# Patient Record
Sex: Male | Born: 1955 | Race: Black or African American | Hispanic: No | Marital: Married
Health system: Southern US, Community
[De-identification: ages and names within clinical notes are randomized; demographics above are authoritative.]

## PROBLEM LIST (undated history)

## (undated) DIAGNOSIS — F319 Bipolar disorder, unspecified: Secondary | ICD-10-CM

## (undated) DIAGNOSIS — F209 Schizophrenia, unspecified: Secondary | ICD-10-CM

## (undated) DIAGNOSIS — E119 Type 2 diabetes mellitus without complications: Secondary | ICD-10-CM

## (undated) DIAGNOSIS — I10 Essential (primary) hypertension: Secondary | ICD-10-CM

---

## 2002-01-13 ENCOUNTER — Encounter: Payer: Self-pay | Admitting: Emergency Medicine

## 2002-01-13 ENCOUNTER — Emergency Department (HOSPITAL_COMMUNITY): Admission: EM | Admit: 2002-01-13 | Discharge: 2002-01-13 | Payer: Self-pay | Admitting: Emergency Medicine

## 2003-10-07 ENCOUNTER — Emergency Department (HOSPITAL_COMMUNITY): Admission: EM | Admit: 2003-10-07 | Discharge: 2003-10-07 | Payer: Self-pay | Admitting: Emergency Medicine

## 2006-10-11 ENCOUNTER — Inpatient Hospital Stay (HOSPITAL_COMMUNITY): Admission: EM | Admit: 2006-10-11 | Discharge: 2006-10-26 | Payer: Self-pay | Admitting: Emergency Medicine

## 2006-10-11 ENCOUNTER — Ambulatory Visit: Payer: Self-pay | Admitting: Pulmonary Disease

## 2006-10-12 ENCOUNTER — Ambulatory Visit: Payer: Self-pay | Admitting: Cardiology

## 2006-10-12 ENCOUNTER — Encounter: Payer: Self-pay | Admitting: Pulmonary Disease

## 2006-10-15 ENCOUNTER — Encounter: Payer: Self-pay | Admitting: Internal Medicine

## 2006-10-15 ENCOUNTER — Ambulatory Visit: Payer: Self-pay | Admitting: Infectious Diseases

## 2006-10-16 ENCOUNTER — Ambulatory Visit: Payer: Self-pay | Admitting: Pulmonary Disease

## 2006-10-18 ENCOUNTER — Encounter: Payer: Self-pay | Admitting: Internal Medicine

## 2006-10-19 ENCOUNTER — Encounter: Payer: Self-pay | Admitting: Internal Medicine

## 2006-10-22 ENCOUNTER — Encounter: Payer: Self-pay | Admitting: Internal Medicine

## 2006-10-22 ENCOUNTER — Encounter: Payer: Self-pay | Admitting: Critical Care Medicine

## 2006-10-24 ENCOUNTER — Encounter: Payer: Self-pay | Admitting: Internal Medicine

## 2006-10-24 ENCOUNTER — Ambulatory Visit: Payer: Self-pay | Admitting: Physical Medicine & Rehabilitation

## 2006-12-14 ENCOUNTER — Telehealth: Payer: Self-pay

## 2006-12-17 DIAGNOSIS — E119 Type 2 diabetes mellitus without complications: Secondary | ICD-10-CM

## 2006-12-17 DIAGNOSIS — R1310 Dysphagia, unspecified: Secondary | ICD-10-CM | POA: Insufficient documentation

## 2006-12-17 DIAGNOSIS — IMO0002 Reserved for concepts with insufficient information to code with codable children: Secondary | ICD-10-CM

## 2007-01-01 ENCOUNTER — Ambulatory Visit: Payer: Self-pay | Admitting: Internal Medicine

## 2007-01-01 DIAGNOSIS — G062 Extradural and subdural abscess, unspecified: Secondary | ICD-10-CM | POA: Insufficient documentation

## 2007-01-01 LAB — CONVERTED CEMR LAB
HCT: 41.9 % (ref 39.0–52.0)
Hemoglobin: 12.8 g/dL — ABNORMAL LOW (ref 13.0–17.0)
Platelets: 302 10*3/uL (ref 150–400)
Sed Rate: 21 mm/hr — ABNORMAL HIGH (ref 0–16)

## 2007-05-13 ENCOUNTER — Emergency Department (HOSPITAL_COMMUNITY): Admission: EM | Admit: 2007-05-13 | Discharge: 2007-05-14 | Payer: Self-pay | Admitting: Emergency Medicine

## 2007-08-06 ENCOUNTER — Emergency Department (HOSPITAL_COMMUNITY): Admission: EM | Admit: 2007-08-06 | Discharge: 2007-08-08 | Payer: Self-pay | Admitting: Emergency Medicine

## 2007-08-08 ENCOUNTER — Ambulatory Visit: Payer: Self-pay | Admitting: Psychiatry

## 2007-08-08 ENCOUNTER — Inpatient Hospital Stay (HOSPITAL_COMMUNITY): Admission: AD | Admit: 2007-08-08 | Discharge: 2007-08-16 | Payer: Self-pay | Admitting: Psychiatry

## 2008-04-11 ENCOUNTER — Emergency Department (HOSPITAL_COMMUNITY): Admission: EM | Admit: 2008-04-11 | Discharge: 2008-04-14 | Payer: Self-pay | Admitting: Emergency Medicine

## 2008-04-14 ENCOUNTER — Inpatient Hospital Stay (HOSPITAL_COMMUNITY): Admission: AD | Admit: 2008-04-14 | Discharge: 2008-04-18 | Payer: Self-pay | Admitting: Psychiatry

## 2008-04-14 ENCOUNTER — Ambulatory Visit: Payer: Self-pay | Admitting: Psychiatry

## 2008-04-14 ENCOUNTER — Emergency Department (HOSPITAL_COMMUNITY): Admission: EM | Admit: 2008-04-14 | Discharge: 2008-04-14 | Payer: Self-pay | Admitting: Emergency Medicine

## 2009-04-07 ENCOUNTER — Other Ambulatory Visit: Payer: Self-pay | Admitting: Emergency Medicine

## 2009-04-07 ENCOUNTER — Inpatient Hospital Stay (HOSPITAL_COMMUNITY): Admission: EM | Admit: 2009-04-07 | Discharge: 2009-04-09 | Payer: Self-pay | Admitting: Psychiatry

## 2009-04-07 ENCOUNTER — Ambulatory Visit: Payer: Self-pay | Admitting: Psychiatry

## 2010-04-03 LAB — RAPID URINE DRUG SCREEN, HOSP PERFORMED
Amphetamines: NOT DETECTED
Barbiturates: NOT DETECTED
Benzodiazepines: NOT DETECTED
Cocaine: NOT DETECTED
Opiates: NOT DETECTED
Tetrahydrocannabinol: NOT DETECTED

## 2010-04-03 LAB — DIFFERENTIAL
Basophils Absolute: 0 10*3/uL (ref 0.0–0.1)
Eosinophils Absolute: 0 10*3/uL (ref 0.0–0.7)
Lymphocytes Relative: 23 % (ref 12–46)
Lymphs Abs: 2.5 10*3/uL (ref 0.7–4.0)
Neutro Abs: 7.8 10*3/uL — ABNORMAL HIGH (ref 1.7–7.7)
Neutrophils Relative %: 74 % (ref 43–77)

## 2010-04-03 LAB — CBC
MCV: 83.8 fL (ref 78.0–100.0)
Platelets: 172 10*3/uL (ref 150–400)
RBC: 5.66 MIL/uL (ref 4.22–5.81)
RDW: 15.2 % (ref 11.5–15.5)
WBC: 10.6 10*3/uL — ABNORMAL HIGH (ref 4.0–10.5)

## 2010-04-03 LAB — BASIC METABOLIC PANEL
Chloride: 103 mEq/L (ref 96–112)
GFR calc Af Amer: >60 mL/min (ref >60)
GFR calc non Af Amer: >60 mL/min (ref >60)
Glucose, Bld: 84 mg/dL (ref 70–99)
Potassium: 4.3 mEq/L (ref 3.5–5.1)

## 2010-04-03 LAB — TRICYCLICS SCREEN, URINE: TCA Scrn: NOT DETECTED

## 2010-04-20 LAB — URINALYSIS, ROUTINE W REFLEX MICROSCOPIC
Glucose, UA: NEGATIVE mg/dL
Hgb urine dipstick: NEGATIVE
pH: 5.5 (ref 5.0–8.0)

## 2010-04-20 LAB — COMPREHENSIVE METABOLIC PANEL
ALT: 22 U/L (ref 0–53)
AST: 37 U/L (ref 0–37)
Albumin: 4 g/dL (ref 3.5–5.2)
Calcium: 9 mg/dL (ref 8.4–10.5)
Chloride: 102 mEq/L (ref 96–112)
Creatinine, Ser: 1.09 mg/dL (ref 0.4–1.5)
GFR calc Af Amer: >60 mL/min (ref >60)
Sodium: 135 mEq/L (ref 135–145)
Total Bilirubin: 0.8 mg/dL (ref 0.3–1.2)

## 2010-04-20 LAB — GLUCOSE, CAPILLARY
Glucose-Capillary: 105 mg/dL — ABNORMAL HIGH (ref 70–99)
Glucose-Capillary: 109 mg/dL — ABNORMAL HIGH (ref 70–99)
Glucose-Capillary: 122 mg/dL — ABNORMAL HIGH (ref 70–99)
Glucose-Capillary: 122 mg/dL — ABNORMAL HIGH (ref 70–99)
Glucose-Capillary: 128 mg/dL — ABNORMAL HIGH (ref 70–99)
Glucose-Capillary: 150 mg/dL — ABNORMAL HIGH (ref 70–99)

## 2010-04-20 LAB — CBC
HCT: 45.5 % (ref 39.0–52.0)
Hemoglobin: 15.4 g/dL (ref 13.0–17.0)
MCHC: 33.9 g/dL (ref 30.0–36.0)
MCV: 82.2 fL (ref 78.0–100.0)
Platelets: 208 10*3/uL (ref 150–400)
RBC: 5.54 MIL/uL (ref 4.22–5.81)
RDW: 14.6 % (ref 11.5–15.5)
WBC: 6.8 10*3/uL (ref 4.0–10.5)

## 2010-04-20 LAB — URINE MICROSCOPIC-ADD ON

## 2010-04-20 LAB — DIFFERENTIAL
Eosinophils Absolute: 0.1 10*3/uL (ref 0.0–0.7)
Eosinophils Relative: 1 % (ref 0–5)
Lymphocytes Relative: 26 % (ref 12–46)
Lymphs Abs: 1.7 10*3/uL (ref 0.7–4.0)
Monocytes Absolute: 0.5 10*3/uL (ref 0.1–1.0)

## 2010-04-20 LAB — ETHANOL: Alcohol, Ethyl (B): <5 mg/dL (ref 0–10)

## 2010-04-20 LAB — RAPID URINE DRUG SCREEN, HOSP PERFORMED: Benzodiazepines: NOT DETECTED

## 2010-05-24 NOTE — Op Note (Signed)
NAME:  Marc [name], Marc [name] NO.:  [id number]   MEDICAL RECORD NO.:  [id number]          PATIENT TYPE:  INP   LOCATION:  3113                         FACILITY:  [facility]   PHYSICIAN:  Hilda Lias, M.D.   DATE OF BIRTH:  April 24, 1955   DATE OF PROCEDURE:  10/19/2006  DATE OF DISCHARGE:                               OPERATIVE REPORT   PREOPERATIVE DIAGNOSIS:  Thoracolumbar epidural hematoma status post  anterior cervical epidural abscess.   POSTOPERATIVE DIAGNOSIS:  Thoracolumbar epidural abscess status post  anterior cervical epidural abscess.   PROCEDURE:  Thoracolumbar laminectomy, evaluation of the epidural  abscess, cultures to the lab.   CLINICAL HISTORY:  [name] is a gentleman who, 72 hours ago,  underwent anterior L3-L4 discectomy to evaluate an epidural abscess.  The patient was kept in the intensive care unit.  He was intubated. We  knew by x-ray that he has also abscess coming from the thoracic 5 down  to the sacrum.  We were worried about his life threatening cervical  epidural and that is why we did that one 72 hours ago.  Now, we are  going to take him to do the decompression of the thoracolumbar area.  The wife and the rest of the family knew the risks including the  possibility of paralysis.   OPERATIVE PROCEDURE:  [name] was taken to the operating room.  He is  quite obese and it was difficult to palpate any spinous process.  Nevertheless, we cleaned the thoracolumbar area with DuraPrep.  We were  able to feel some of the ribs and we made the incision from the upper  lumbar area to the lower thoracic area.  X-ray eventually showed that we  were probably at the level of T10-T11.  For this type of procedure,  there was no need to visualize the exact level because of the compromise  of the whole spine.  The incision was carried out all the way down until  we found the spinous process and the muscle was retracted laterally.  Then with the Leksell,  we proceeded with removal of the spinous process  and with the drill with the laminectomies.  We removed the yellow  ligament and immediately liquid pus came into view.  The specimen was  sent to the laboratory, although we knew that the one growing in the  neck was MRSA.  Nevertheless, after we removed this initial abscess, we  continued to remove the rest of the yellow ligament.  Again, using a  pediatric feeding tube as we did with the cervical area, it was  introduced first proximal about 10 cm and more pus came into view.  Then, the same catheter was introduced down in the epidural space all  the way down about 20 cm.  More liquid pus was removed.  Then, slowly  using saline solution, we started to irrigate the thoracic and the  lumbar area bringing more pus into view.  At the end, we did a total  gross removal of the liquid pus.  The dura mater was thick and yellow.  From  then on, a Hemovac was left in the operative site. The wound was  closed with Vicryl and skin with staples.  The patient is going to go  back to the intensive care unit. We are going to continue him on the  previous IV antibiotic therapy.           ______________________________  Hilda Lias, M.D.     EB/MEDQ  D:  10/19/2006  T:  10/20/2006  Job:  [id number]

## 2010-05-24 NOTE — Consult Note (Signed)
NAME:  KENDELL, SAGRAVES NO.:  [id number]   MEDICAL RECORD NO.:  [id number]          PATIENT TYPE:  INP   LOCATION:  3113                         FACILITY:  [facility]   PHYSICIAN:  Hilda Lias, M.D.   DATE OF BIRTH:  [birth year]   DATE OF CONSULTATION:  10/17/2006  DATE OF DISCHARGE:                                 CONSULTATION   Mr. Morino is a gentleman who was admitted to [facility] on  October 10, 2006 because of complaint of weakness.  The patient has a  history of nausea and vomiting.  He has a history of diabetes with the  blood sugar initially on admission was and admission was 370.  He has a  history of hypertension.  The night the patient became febrile.  The  blood culture was positive.  He complains of back pain.  Because of the  back pain, he had a CT scan and because of the findings, he was sent to  [facility] MRI department October 8 about 10 o'clock.  The patient was  to have an MRI, but because of restlessness, the patient had to be  intubated and sedated.  I was called to see him and although I was  unable to see him initially because he was in the MRI, nevertheless on  three or four occasions, I was able to follow the MRI result with the  radiologist.  At the end we find out that this gentleman has a large  epidural abscess anterior from C1 all the way down to C5-6 and posterior  from thoracic #8 down to the  sacral L5-S1.  The patient was transferred  to the intensive care unit.  He still was heavily sedated and intubated.  From my point of view, there was no way to find out how neurologically.  There was no way for me to find any weakness whatsoever or any history  of numbness.  The patient was having a Foley catheter.  Because of the  findings, although the thoracolumbar as well as the cervical was quite  dramatic, nevertheless I was concerned mostly about the size of the  epidural hematoma in the cervical area,  going to C1, almost up to  the  base of the skull and to the brain stem.  I was able to talk to his wife  when she came about 6 o'clock.  I fully explained to her the situation.  She was fully aware that neurologically I do not know in what condition  he was in prior to admission, during admission and transfer this morning  to the hospital.  I still did not know because the medication he was  having to be intubated.  I told her that we need to proceed with the  decompression of the cervical area daily to prevent damage to the brain  and avoid the possibility of a quadriplegia.  Then we can later on  address the thoracolumbar area either tomorrow or on Friday, but my  priority was  the cervical area because of the catastrophic consequence  if this problem continued  to go back up into the base of the brain.  She  is fully awake and she knew about the risk of paralysis, of course,  unable to remove the abscess damage to the vessels of the neck,  paralysis, no improvement whatsoever, failure of the graft, failure of  the hardware needed for the surgery.  With those conditions she fully  approved an exploration of the cervical area.           ______________________________  Hilda Lias, M.D.     EB/MEDQ  D:  10/17/2006  T:  10/18/2006  Job:  [id number]
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
## 2010-05-24 NOTE — Consult Note (Signed)
NAME:  Marc [name], Marc [name] NO.:  [id number]   MEDICAL RECORD NO.:  [id number]          PATIENT TYPE:  INP   LOCATION:  1522                         [facility]ACILITY[facility]  [facility]   PHYSICIAN:  Marlan Palau, M.D.  DATE OF BIRTH:  11-07-1955   DATE OF CONSULTATION:  10/15/2006  DATE OF DISCHARGE:                                 CONSULTATION   NEUROLOGY CONSULT   HISTORY:  This is a 55 year old right-handed black male born Oct 20, 1955 with a history of hypertension, medical noncompliance and new  onset diabetes.  The patient claims to have had a several month history  of some neck stiffness, particularly worse over the last 2 weeks.  The  patient has also noted progressive weakness of the legs over the last 2  weeks to the point where he is no longer able to walk during this  hospitalization.  The patient has had some urinary frequency and urgency  as well for the 2 weeks prior to admission and the neck stiffness is  particularly worsened, with the discomfort going all the way down the  spine to the low back into the legs.  The patient can barely move his  neck side-to-side or flexion/extension without getting significant pain.  The patient denies any discomfort going down the arms, has not had any  weakness of the arms.  The patient denies any falls per se.  The patient  has had a CT scan of the brain that is unremarkable. has had some  confusion associated with sepsis, was seen by Dr. Delford Field from critical  care medicine who questioned the possibility of a spinal epidural  abscess.  For this reason, a neurology consultation was obtained.  The  patient has had very high white counts during this hospitalization and  has been treated for possible staph sepsis.  White blood count range has  been in the 15-20 range.   PAST MEDICAL HISTORY:  1. Progressive lower extremity weakness, urinary dysfunction over the      last 2 weeks, rule out spinal epidural abscess  2. Obesity.  3. Diabetes  4. Hypertension  5. Tobacco abuse.  The patient smokes a pack of cigarettes a day.      Does not drink alcohol.   ALLERGIES:  The patient has no known allergies.   CURRENT MEDICATIONS:  1. Lovenox 60 mg subcu daily  2. Advair 1 puff b.i.d.  3. Sliding scale insulin, Lantus insulin 20 units q.h.s.,  4. Metoprolol 25 mg b.i.d.  5. Protonix 40 mg daily  6. Potassium supplementation for significant hypokalemia  7. Vancomycin 1750 mg IV q.12 h  8. Normodyne if needed   SOCIAL HISTORY:  [place], is married, has two children who are alive and well.  Has worked  in the past as a Naval architect.   FAMILY MEDICAL HISTORY:  Notable that mother is alive with hypertension.  Father died with stroke.  The patient has no sisters, has one brother  who is alive and well.   REVIEW OF SYSTEMS:  Is notable for some fevers, chills prior to  admission.  Denies headache, does have some neck and back discomfort as  above, increased pain with flexion/extension of the neck.  Denies  Lhermitte's phenomenon, denies shortness of breath, chest pains,  abdominal pain, nausea, vomiting, has had some constipation and some  dysfunction of the bladder over the last 2-3 weeks, has had some trouble  walking but no falls.   PHYSICAL EXAMINATION:  VITALS:  Blood pressure is currently 129/96,  heart rate 109, respiratory rate 18, temperature 100.2  IN GENERAL the patient is a moderately obese black male who is alert and  cooperative at the time examination.  HEENT:  Examination, head is atraumatic.  EYES:  Pupils equal, round and  react to light.  Disks are flat bilaterally.  NECK is supple.  No carotid bruits noted.  RESPIRATORY:  Examination reveals occasional wheezes bilaterally.  Occasional rhonchi.  CARDIOVASCULAR:  Regular rate and rhythm.  No obvious murmurs, rubs  noted.  EXTREMITIES:  Reveal 2+ edema at the ankles bilaterally.  NEUROLOGIC  EXAMINATION:  Cranial nerves as above.  Facial symmetry is  present.  The patient has good sensation of face to pinprick, soft touch  bilaterally.  Has good strength of the facial muscles, muscles of head  turn, shoulder shrug bilaterally.  Speech is well enunciated, not  aphasic.  Again extraocular movements are full, visual fields are full.  Motor testing reveals good strength in both upper extremities.  With  lower extremities, the patient has barely antigravity movement of the  lower extremities, has definite weakness with the quadriceps muscles  bilaterally.  Has fairly good dorsiflexion of the feet bilaterally.  Patient has fair finger-nose-finger, cannot really perform heel-to-shin  well with the legs.  No sensory level is present.  Patient has good  pinprick sensation throughout.  Good vibratory sensation throughout.  No  drift of the upper extremities noted.  Deep tendon reflexes depressed  throughout.  Toes are neutral bilaterally.   LABORATORY VALUES:  Notable for a sodium level 132, potassium 3.5,  chloride of 96, CO2 of 30, glucose of 49, BUN seven, creatinine 0.73,  total bili 0.7, alk phosphatase of 133, SGOT of 101, SGPT of 110, total  protein 5.5, albumin of 1.6, calcium 7.8, phos of 2.7, magnesium 1.8.   CT of the head is as above.   IMPRESSION:  1. History of febrile illness with sepsis  2. Neck stiffness, progressive lower extremity weakness and      bowel/bladder dysfunction.   Critical care medicine has seen and evaluated this patient and has  recommended spine evaluation for possible epidural abscess.  I would be  in agreement with this.  This patient has a history and physical  examination that are fully consistent with this.  Need to rule out other  causes of spinal cord dysfunction such as external compression from disk  or bone spur.  Hypokalemia may be adding some of the weakness in the  lower extremities.  Will check CK levels as well.   PLAN:  1. The  patient has already been set up for an MRI scan of the      cervical, thoracic and lumbosacral spine.  I would agree with this  2. Check CK level  3. I will follow the patient's clinical course. If epidural abscess is      present neurosurgical evaluation may be indicated.  Will follow the      patient's clinical course while in-house.  Marlan Palau, M.D.  Electronically Signed     CKW/MEDQ  D:  10/15/2006  T:  10/15/2006  Job:  [id number]

## 2010-05-24 NOTE — Op Note (Signed)
NAME:  Marc [name], Marc [name] NO.:  [id number]   MEDICAL RECORD NO.:  [id number]          PATIENT TYPE:  INP   LOCATION:  3113                         FACILITY:  [facility]   PHYSICIAN:  Hilda Lias, M.D.   DATE OF BIRTH:  [birth year]   DATE OF PROCEDURE:  10/17/2006  DATE OF DISCHARGE:                               OPERATIVE REPORT   PREOPERATIVE DIAGNOSES:  1. C1-C6 thoracolumbar epidural abscess.  2. History of diabetes mellitus.  3. Hypertension.   POSTOPERATIVE DIAGNOSES:  1. C1-C6 thoracolumbar epidural abscess.  2. History of diabetes mellitus.  3. Hypertension.   PROCEDURE:  C3-C4 diskectomy, partially removed posterior border of C3  and C4, opening the posterior ligament,  evacuation of pus in the  epidural space anteriorly.  Microscope.   SURGEON:  Hilda Lias, M.D.   CLINICAL HISTORY:  The patient is being taken to surgery as emergency  because of the history of epidural hematoma from C1 down to C5-6  anteriorly.  He also has a posterior abscess from thoracic 6 down to the  L5-S1.  The surgery was fully explained to the wife.   PROCEDURE:  Anterior C3-C4 diskectomy, partial removal of the posterior  aspect of C3 and C4 with a question of pus from the epidural space.   SURGEON:  Hilda Lias, M.D.   PROCEDURE:  The patient was taken to the OR.  The patient had been  previously intubated. A shoulder roll was applied and then traction with  10 pounds was used for the neck.  The patient  is quite obese with a  short neck.  The neck was prepped with DuraPrep. Drapes were applied.  A  longitudinal incision was made through the skin and subcutaneous tissue.  Immediately we found that right immediately after we opened the  platysma, there was quite a bit of swelling mostly in the muscle area as  well as well as the esophagus.  Dissection was carried down and it was  difficult to see and feel the C2-C3 space.  Because of that, we did an x-  ray and showed  that indeed we were at the level of C3-C4.  From then on  we brought the microscope into the area, the anterior ligamentum was  opened and a total diskectomy was achieved.  Then before we opened the  posterior ligament with the drill, we removed the posterior aspect of C3  and also C4.  Having a good wide area, we opened the posterior ligament.  Immediately liquid pus at high pressure came. A specimen was taken and  sent to the laboratory.  Then using suction and a micro hook and later  on small to about 2 cm length where inserted into the epidural space.  There was at least 4-5 mm space between the posterior ligament and the  dura mater.  Pus came easily. Then we inserted a small pediatric  catheter about 2-3 inches from C3.  At the beginning vacuum pressure was  applied to drain the area.  Then with a small amount and low pressure  saline solution, we cleaned the  area.  The same procedure was done down  below C4 introducing the catheter at least 4-6 inches.  The same  irrigation was achieved.  This was done at least 10 times until we were  unable to get more cleaned with pulse.  Because of that, we decided not  to proceed with a corpectomy.  Having a good decompression, we introduced an allograft of 10 mm height.  This was followed by a plate using four screws.  Lateral cervical spine  showed good position of the bone graft at the plate.  From then on two  drain were left in the precervical area and the wound was closed with  Vicryl and Steri-Strips.           ______________________________  Hilda Lias, M.D.     EB/MEDQ  D:  10/17/2006  T:  10/18/2006  Job:  [id number]

## 2010-05-24 NOTE — H&P (Signed)
NAME:  Marc [name], Marc [name] NO.:  [id number]   MEDICAL RECORD NO.:  [id number]          PATIENT TYPE:  EMS   LOCATION:  ED                           FACILITY:  [facility]   PHYSICIAN:  Hollice Espy, M.D.DATE OF BIRTH:  [birth year]   DATE OF ADMISSION:  10/10/2006  DATE OF DISCHARGE:                              HISTORY & PHYSICAL   CHIEF COMPLAINT:  Weakness.   HISTORY OF PRESENT ILLNESS:  The patient is a 55 year old African  American male with past medical history of hypertension, was previously  on medication but has not taken it for some time who was previously an  [facility] patient several years ago.  Since that time, the patient has lost  his job and has not followed up on any position he comes to the  emergency room today with complaints of weakness, fatigue, polyuria for  several days.  In the emergency room, the patient was found to have  blood glucose of 369.  The rest of his labs were noted for a bicarb  level of 18, a white count 20.6 and 97% shift.  The urinalysis noted a  large amount of hemoglobin, greater than 1000, and glucose greater than  80 ketones, all consistent with new onset diabetic and secondary  dehydration.  His urinalysis unremarkable for any signs of infection.  The patient was started on IV fluids and insulin and his sugars since  have come down.  He claims feeling quite fatigued and weak still.  There  is also some generalized tremors from being unsteady on his feet which  is new.  He denies any headaches, vision changes, dysphagia, chest pain,  palpitations, shortness of breath, wheezing, coughing  abdominal pain.  No hematuria, dysuria.  He does complain of polyuria.  No constipation,  diarrhea, no focal extremity numbness, weakness or pain.  He complains  of generalized upper and lower extremity shaking.  His review of systems  otherwise negative.   PAST MEDICAL HISTORY:  Hypertension.   MEDICATIONS:  He was placed on blood pressure  medicines but stopped  taking them years ago, says he cannot afford them.  He has no other past  medical history.   ALLERGIES:  None.   SOCIAL HISTORY:  He smokes about a pack a day.  Denies any drug or  alcohol use.   FAMILY HISTORY:  Noncontributory.   PHYSICAL EXAMINATION:  VITAL SIGNS:  On admission, temperature 97.3,  heart rate 106, blood pressure 158/99, respirations 24, oxygen  saturation 95% on room air.  GENERAL:  The patient is alert and oriented x3, in no apparent distress.  HEENT:  Normocephalic, atraumatic.  Mucous membranes are dry.  He has no  carotid bruits.  HEART:  Regular rhythm.  Mild tachycardia.  LUNGS:  Clear to auscultation bilaterally.  ABDOMEN:  Soft, nontender, nondistended.  Positive bowel sounds.  EXTREMITIES:  No clubbing or cyanosis.  Trace pitting edema.   LABORATORY DATA:  Sodium 121, potassium 3, chloride 82, bicarb 18, BUN  13, creatinine 1.37, glucose 369.  White count 20.6, hemoglobin 14,  hematocrit 43, MCV 81,  platelet count 231,000, 86% neutrophils.  Urinalysis showed large hemoglobin, greater than 1000 glucose, greater  and 88 ketones.  Protein 30. PH 7.3, pCO2 24, pO2 77, bicarb 12. This  was on room air but his UA micro  was 1120 red cells, few bacteria.   ASSESSMENT/PLAN:  1. New onset diabetic.  Put the patient on sliding scale with diabetic      education and metformin 500 mg b.i.d. Changing the medicine that      should be able to be cheap for him to buy on the generic [employer]      or [employer] plan.  I have reinforced the need for the patient to take      his medications on a monthly basis versus not being able to afford      hospital bills which will occur if he did not take his medications.      He tells me he would be willing to do so.  2. Hypertension.  Will start the patient on lisinopril which will both      be good for renal preservation as well as his hypertension.  3. Medical noncompliance.  4. Tobacco abuse.  The patient  declines nicotine patch.      Hollice Espy, M.D.  Electronically Signed     SKK/MEDQ  D:  10/11/2006  T:  10/11/2006  Job:  [id number]   cc:   Dala Dock
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
## 2010-05-24 NOTE — Discharge Summary (Signed)
NAME:  Marc [name], Marc [name] NO.:  [id number]   MEDICAL RECORD NO.:  [id number]          PATIENT TYPE:  INP   LOCATION:  5153                         FACILITY:  [facility]   PHYSICIAN:  Charlcie Cradle. Delford Field, MD, FCCPDATE OF BIRTH:  [birth year]   DATE OF ADMISSION:  10/16/2006  DATE OF DISCHARGE:  10/22/2006                               DISCHARGE SUMMARY   INTERIM DISCHARGE SUMMARY:   FINAL DIAGNOSES:  Final discharge diagnoses upon time of critical care  signoff.  1. Epidural abscess, status post drainage with culture data      demonstrating methicillin Staph aureus.  2. Dysphagia.  3. Debilitation.  4. Resolved sepsis/septic shock secondary to problem #1.   CONSULTATIONS:  1. Cliffton Asters, M.D. with infectious disease.  2. Hilda Lias, M.D. with neurosurgery.  3. Marlan Palau, M.D. with neurology.   PROCEDURES:  1. October 11, 2006 - left internal jugular vein catheter, removed      October 14, 2006.  2. Right PICC line placed October 15, 2006.  3. Endotracheal tube placed October 17, 2006.  4. Right radial A line placement October 17, 2006.  5. Oroendotracheal tube removed October 20, 2006.  6. Arterial line removed October 20, 2006.   ADDITIONAL PROCEDURES:  1. C1 through C5 diskectomy, partial removal of posterior border of C3      and C4, opening of posterior ligament and evacuation of pus in the      epidural space by Dr. Jeral Fruit on October 17, 2006.  2. Thoracolumbar laminectomy with evaluation of epidural abscess and      evacuation on October 19, 2006.   LABORATORY DATA:  1. October 22, 2006 - sodium 132, potassium 4, chloride 93, CO2 33,      glucose 119, BUN 8, creatinine 0.61.  2. October 22, 2006 - white blood cell count 11.9, hemoglobin 9.8,      hematocrit 28.1, platelet count 354.   X-RAY AND MICROBIOLOGY:  1. Urine culture obtained September 19, 2006 negative.  Blood cultures      both obtained September 19, 2006 demonstrating methicillin  sensitive Staphylococcus aureus.  Blood cultures obtained on      October 14, 2006 both negative.  2. Abscess culture from cervical spine, epidural abscess, obtained      09/17/06, demonstrating methicillin-sensitive Staph aureus.  3. Wound culture obtained on October 19, 2006.  Preliminary data      demonstrating diffuse staph aureus which again was methicillin      sensitive.   RADIOLOGY:  Chest x-ray obtained on October 20, 2006 demonstrated right  basilar atelectasis.   BRIEF HISTORY:  Marc [name] is a 55 year old African-American male who  presented to [facility] initially on October 10, 2006, admitted by  the [facility] internal medicine cervix.  See their history and physical  dictation.  He was initially admitted to the intensive care secondary to  metabolic acidosis, hyperglycemia, metabolic acidosis and sepsis.  Ten  cultures were obtained upon presentation with blood cultures positive  for methicillin associated.  Initial shock was resolved with organ  failure improved and therefore  he was moved to the medical further  therapy.  Upon evaluation after resolution of shock physiology, patient  continued to complain of neck pain and lower extremity weakness.  Therefore neurological and infectious disease consultations were  obtained due to clinical findings that appeared consistent with epidural  abscess.  Attempts were made at [facility] to obtain the  appropriate objective studies, including MRI; however, due to [name] discomfort and inability to lie flat for the MRI exam, he had  to be transferred to [facility] on October 16, 2006, where he was  subsequently intubated and then underwent extensive MRI of the cervical  to lumbar spine evaluation.  With the findings essentially demonstrating  large ventral epidural abscess extending from the upper C1 to the C6  level with most notable at C2, causing cord compression, with extension  of the epidural abscess all  the way down to the S1 region.  Therefore,  neurosurgical was consulted.   HOSPITAL COURSE:  1. Epidural abscess, extensive, ranging from C-spine all the way down      to S1.  Now status post evacuation per neurosurgery with cultures      demonstrating methicillin sensitive Staph aureus.  Marc [name] as      previously mentioned, was transferred to [facility] for      diagnostic evaluation by MRI, which were not able to do at [facility].  The MRI findings were indeed positive for epidural      abscess and he therefore underwent surgical evaluation. Initially,      on October 17, 2006 of the C-spine area and then later on October 19, 2006, of the thoracolumbar area.  Upon time of critical care      signoff, Marc [name] is making slow progress following surgical      evacuation of epidural abscess.  He is now on day #10 of 6 weeks.      Again day 10 of 6 weeks of antibiotic therapy, which has been      guided by the infectious disease department.  2. Status post septic shock secondary to problem #1.  This was treated      in a typical fashion with early gold-directed therapy, aggressive      volume resuscitation, and empiric antibiotics.  Are resolution of      septic shock he was found to have persistent back pain, which upon      further evaluation demonstrated epidural abscesses  3. Diabetes type 2.  Marc [name] initially presented with extremely      high blood glucoses, felt to be exacerbated by acute infection.  He      initially carried the diagnosis of hyperosmolar nonketotic coma.      He did have a lactic acidosis upon time of presentation, but no      ketones in the blood.  This was resolved with aggressive volume      resuscitation during the early sepsis resuscitation time-frame.      Insulin infusion was initiated.  He remains hyperglycemic secondary      to diabetes type 2 and he does require sliding scale insulin upon      time of critical care  service signoff.  4. Dysphagia.  Evaluated by modified barium swallow, by speech therapy      with recommendations demonstrating nectar-thick fluids and to      observe aspiration precautions and  dysphagia 3 mechanically soft      diet.  5. Debility.  Marc [name] is significantly debilitated following      prolonged critical illness.  Physical therapy and occupational      therapy services have already been consulted.  Consideration for      [facility] versus skilled care versus inpatient rehab should      be entertained.   DISCHARGE MEDICATIONS:  Upon time of critical care signoff:  1. Ancef 2 grams IV q8 hours.  2. Chlorhexidine rinse 15 mL b.i.d.  3. Colace 100 mg p.o. b.i.d.  4. Sliding scale insulin.  5. Lopressor 5 mg IV q.6 hours.  6. Sodium chloride flush.  7. Ventolin 2.5 inhaled q.4 hours p.r.n. shortness of breath.  8. Benadryl 25 mg p.o. q.h.s. p.r.n.  9. Morphine 2-4 mg IV q.3 hours p.r.n. pain.      Zenia Resides, NP      Charlcie Cradle. Delford Field, MD, [facility]  Electronically Signed    PB/MEDQ  D:  10/23/2006  T:  10/24/2006  Job:  [id number]

## 2010-05-27 NOTE — Discharge Summary (Signed)
NAME:  Marc [name], Marc [name] NO.:  [id number]   MEDICAL RECORD NO.:  [id number]          PATIENT TYPE:  INP   LOCATION:  5153                         FACILITY:  [facility]   PHYSICIAN:  Michiel Cowboy, MDDATE OF BIRTH:  01-23-1955   DATE OF ADMISSION:  10/16/2006  DATE OF DISCHARGE:  10/26/2006                               DISCHARGE SUMMARY   ADDENDUM:  This is an addendum to an interim discharge summary dictated  on the 14th of October, 2008.   ATTENDING PHYSICIAN:  Michiel Cowboy, MD   FINAL DIAGNOSIS:  1. Epidural abscess status post drainage with culture data      demonstrating methicillin Staphylococcus aureus.  2. Dysphagia.  3. Debilitation.  4. Resolved sepsis, secondary to epidural abscess.  5. Diabetes mellitus.  6. Hypertension.   CONSULTATIONS:  Cliffton Asters, M.D. of Infectious Disease; Hilda Lias, M.D. of Neurosurgery; and Marlan Palau, M.D. of Neurology.  Please see full procedure list from prior discharge summary.   BRIEF HISTORY:  1. Marc [name] is a 55 year old gentleman with history of diabetes and      hypertension, followed by [name] who was admitted by      [facility] initially with severe back pain.  Further      evaluation showed that he has epidural abscess and septic shock.      Critical care was called.  Patient was transferred under their      service and had a very prolonged stay, complicated by      hyperglycemia, metabolic acidosis and sepsis.  Initial septic shock      has resolved.  Patient initially was admitted to [facility], but      they were not able to perform the necessary studies there so he was      transferred to [facility], where he had to be intubated in order to      obtain MRI of his cervical lumbar spine, which showed extensive      ventral epidural abscess extending from C1 all the way down to S1      region.  Neurosurgery was consulted and drained on October 17, 2006,  and later on October 19, 2006.  Since then patient was on Ancef 2      gm IV q.8h. as per recommendations by Infectious Disease (ID).  2. Diabetes mellitus.  Patient initially presented with high blood      glucose.  On further discussion with his primary care Marc [name], he      does have a history of diabetes but has not been taking any of his      medications.  Patient was initially started on insulin drip and      then switched to sliding scale insulin.  Once his infection was      under control, his blood sugars were better controlled just on      sliding scale insulin and he did not require any extended insulin.   Patient was evaluated by PT/OT.  He was deemed to be able to swallow and  have  a regular diet.  Patient at first wanted to be discharged to rehab,  but later on chose to actually go home with home health and PT/OT.   It was noted that patient had urinary retention, and we started him on  Flomax with no improvement.  We had neurology follow up with him as an  outpatient and discharge him home with a Foley and a leg bag.   At the time of discharge, patient was afebrile, blood pressure 127/87,  he is 96% on room air.  Blood glucose stayed within 95 to 142.  White  blood cell count was 7.8, H&H 11.0/33.5, creatinine 0.7.   DISCHARGE MEDICATIONS:  1. Glipizide 5 mg p.o. daily.  2. Metoprolol 25 mg p.o. b.i.d.  3. Ancef 2 g IV q.8h., end date on November 29, 2006.  4. Enalapril 2.5 mg p.o. daily.  5. Flomax 0.4 mg p.o. daily.   FOLLOWUP:  Patient to follow up with urology, Dr. Dorothe Pea, phone number  [phone] within one week.  Patient to also follow up with Dr. Blair Dolphin  office, to call for an appointment within one week.  Patient will also  be followed up by neurosurgery; Dr. Cassandria Santee office will help with  scheduling of his follow-up appointment.  Patient is to return to seek  immediate medical attention if develops fevers, chills, nausea,  vomiting, chest pain,  shortness of breath.      Michiel Cowboy, MD  Electronically Signed     AVD/MEDQ  D:  11/21/2006  T:  11/22/2006  Job:  [id number]
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
## 2010-10-07 LAB — DIFFERENTIAL
Basophils Absolute: 0
Basophils Relative: 1
Eosinophils Absolute: 0.1
Eosinophils Relative: 1
Lymphocytes Relative: 35
Lymphs Abs: 2.6
Monocytes Absolute: 0.3
Monocytes Relative: 4
Neutro Abs: 4.4
Neutrophils Relative %: 59

## 2010-10-07 LAB — POCT I-STAT, CHEM 8
BUN: 12
Calcium, Ion: 1.13
Hemoglobin: 17
Sodium: 139
TCO2: 26

## 2010-10-07 LAB — CBC
HCT: 46.8
Hemoglobin: 15.5
MCHC: 33
MCV: 82.1
Platelets: 204
RBC: 5.7
RDW: 15.5
WBC: 7.4

## 2010-10-07 LAB — RAPID URINE DRUG SCREEN, HOSP PERFORMED
Amphetamines: NOT DETECTED
Barbiturates: NOT DETECTED
Benzodiazepines: NOT DETECTED
Cocaine: NOT DETECTED
Opiates: NOT DETECTED
Tetrahydrocannabinol: NOT DETECTED

## 2010-10-07 LAB — GLUCOSE, CAPILLARY
Glucose-Capillary: 107 — ABNORMAL HIGH
Glucose-Capillary: 123 — ABNORMAL HIGH
Glucose-Capillary: 135 — ABNORMAL HIGH
Glucose-Capillary: 139 — ABNORMAL HIGH
Glucose-Capillary: 177 — ABNORMAL HIGH
Glucose-Capillary: 178 — ABNORMAL HIGH
Glucose-Capillary: 192 — ABNORMAL HIGH

## 2010-10-07 LAB — TSH: TSH: 0.75

## 2010-10-07 LAB — RPR
RPR Ser Ql: NONREACTIVE
RPR Ser Ql: NONREACTIVE

## 2010-10-07 LAB — VITAMIN B12: Vitamin B-12: 444 (ref 211–911)

## 2010-10-07 LAB — URINE MICROSCOPIC-ADD ON

## 2010-10-07 LAB — URINALYSIS, ROUTINE W REFLEX MICROSCOPIC
Hgb urine dipstick: NEGATIVE
Specific Gravity, Urine: 1.024
Urobilinogen, UA: 0.2

## 2010-10-07 LAB — HEMOGLOBIN A1C
Hgb A1c MFr Bld: 6.5 — ABNORMAL HIGH
Mean Plasma Glucose: 154

## 2010-10-07 LAB — HEPATIC FUNCTION PANEL
AST: 15
Bilirubin, Direct: <0.1

## 2010-10-07 LAB — FOLATE: Folate: >20

## 2010-10-19 LAB — COMPREHENSIVE METABOLIC PANEL
ALT: 23
AST: 24
CO2: 30
Chloride: 98
Creatinine, Ser: 0.7
GFR calc Af Amer: >60
GFR calc non Af Amer: >60
Glucose, Bld: 150 — ABNORMAL HIGH
Sodium: 134 — ABNORMAL LOW
Total Bilirubin: 0.3

## 2010-10-19 LAB — CBC
Hemoglobin: 11 — ABNORMAL LOW
MCHC: 32.8
MCV: 80.2
RBC: 4.18 — ABNORMAL LOW
WBC: 7.8

## 2010-10-20 LAB — CBC
HCT: 33.7 — ABNORMAL LOW
HCT: 37.6 — ABNORMAL LOW
HCT: 40.3
HCT: 40.8
HCT: 29.1 — ABNORMAL LOW
HCT: 29.2 — ABNORMAL LOW
HCT: 32.1 — ABNORMAL LOW
HCT: 32.6 — ABNORMAL LOW
Hemoglobin: 12.1 — ABNORMAL LOW
Hemoglobin: 13.5
Hemoglobin: 13.6
Hemoglobin: 10.6 — ABNORMAL LOW
Hemoglobin: 11.4 — ABNORMAL LOW
Hemoglobin: 9.8 — ABNORMAL LOW
Hemoglobin: 9.8 — ABNORMAL LOW
MCHC: 33.5
MCHC: 34.7
MCHC: 33.5
MCV: 78.1
MCV: 79.2
MCV: 80.4
MCV: 79.4
MCV: 79.7
MCV: 80
Platelets: 210
Platelets: 212
Platelets: 212
Platelets: 214
Platelets: 219
Platelets: 231
Platelets: 349
Platelets: 354
Platelets: 361
RBC: 4.31
RBC: 4.74
RBC: 5.01
RBC: 5.28
RBC: 3.64 — ABNORMAL LOW
RBC: 4.03 — ABNORMAL LOW
RBC: 4.09 — ABNORMAL LOW
RBC: 4.31
RDW: 14.3 — ABNORMAL HIGH
RDW: 14.7 — ABNORMAL HIGH
RDW: 14.7 — ABNORMAL HIGH
RDW: 14.9 — ABNORMAL HIGH
RDW: 14.4 — ABNORMAL HIGH
RDW: 14.7 — ABNORMAL HIGH
WBC: 12.3 — ABNORMAL HIGH
WBC: 14.3 — ABNORMAL HIGH
WBC: 15.4 — ABNORMAL HIGH
WBC: 15.5 — ABNORMAL HIGH
WBC: 15.9 — ABNORMAL HIGH
WBC: 16.2 — ABNORMAL HIGH
WBC: 20.6 — ABNORMAL HIGH
WBC: 11.9 — ABNORMAL HIGH
WBC: 15.8 — ABNORMAL HIGH
WBC: 16.5 — ABNORMAL HIGH
WBC: 20.1 — ABNORMAL HIGH

## 2010-10-20 LAB — DIFFERENTIAL
Basophils Absolute: 0
Basophils Relative: 0
Eosinophils Absolute: 0
Eosinophils Relative: 0
Lymphocytes Relative: 5 — ABNORMAL LOW
Lymphs Abs: 1
Monocytes Absolute: 1.6 — ABNORMAL HIGH
Monocytes Relative: 8
Neutro Abs: 18 — ABNORMAL HIGH
Neutrophils Relative %: 87 — ABNORMAL HIGH

## 2010-10-20 LAB — COMPREHENSIVE METABOLIC PANEL
ALT: 105 — ABNORMAL HIGH
ALT: 110 — ABNORMAL HIGH
AST: 125 — ABNORMAL HIGH
AST: 94 — ABNORMAL HIGH
Albumin: 1.6 — ABNORMAL LOW
Albumin: 1.6 — ABNORMAL LOW
Albumin: 1.7 — ABNORMAL LOW
Albumin: 2.3 — ABNORMAL LOW
Alkaline Phosphatase: 113
Alkaline Phosphatase: 129 — ABNORMAL HIGH
Alkaline Phosphatase: 135 — ABNORMAL HIGH
BUN: 11
BUN: 18
Calcium: 7.7 — ABNORMAL LOW
Calcium: 9.5
Chloride: 103
Chloride: 96
Creatinine, Ser: 1.16
GFR calc Af Amer: >60
Glucose, Bld: 49 — ABNORMAL LOW
Potassium: 2.4 — CL
Potassium: 3 — ABNORMAL LOW
Potassium: 3.2 — ABNORMAL LOW
Sodium: 129 — ABNORMAL LOW
Sodium: 130 — ABNORMAL LOW
Total Bilirubin: 0.8
Total Bilirubin: 1.5 — ABNORMAL HIGH
Total Protein: 5.4 — ABNORMAL LOW
Total Protein: 5.5 — ABNORMAL LOW
Total Protein: 5.6 — ABNORMAL LOW
Total Protein: 6.3

## 2010-10-20 LAB — BASIC METABOLIC PANEL
BUN: 13
BUN: 18
BUN: 20
BUN: 7
BUN: 7
BUN: 8
CO2: 30
Calcium: 10.1
Calcium: 6.7 — ABNORMAL LOW
Calcium: 9
Calcium: 8 — ABNORMAL LOW
Calcium: 8.3 — ABNORMAL LOW
Chloride: 89 — ABNORMAL LOW
Chloride: 94 — ABNORMAL LOW
Chloride: 95 — ABNORMAL LOW
Chloride: 96
Chloride: 94 — ABNORMAL LOW
Chloride: 99
Chloride: 99
Creatinine, Ser: 0.63
Creatinine, Ser: 0.8
Creatinine, Ser: 1.12
Creatinine, Ser: 1.37
Creatinine, Ser: 0.61
GFR calc Af Amer: >60
GFR calc Af Amer: >60
GFR calc Af Amer: >60
GFR calc Af Amer: >60
GFR calc Af Amer: >60
GFR calc Af Amer: >60
GFR calc Af Amer: >60
GFR calc Af Amer: >60
GFR calc non Af Amer: 55 — ABNORMAL LOW
GFR calc non Af Amer: >60
GFR calc non Af Amer: >60
GFR calc non Af Amer: >60
GFR calc non Af Amer: >60
GFR calc non Af Amer: >60
GFR calc non Af Amer: >60
GFR calc non Af Amer: >60
GFR calc non Af Amer: >60
GFR calc non Af Amer: >60
GFR calc non Af Amer: >60
Glucose, Bld: 139 — ABNORMAL HIGH
Glucose, Bld: 261 — ABNORMAL HIGH
Glucose, Bld: 278 — ABNORMAL HIGH
Glucose, Bld: 49 — ABNORMAL LOW
Glucose, Bld: 125 — ABNORMAL HIGH
Glucose, Bld: 133 — ABNORMAL HIGH
Glucose, Bld: 183 — ABNORMAL HIGH
Potassium: 2.7 — CL
Potassium: 2.7 — CL
Potassium: 3.1 — ABNORMAL LOW
Potassium: 3.1 — ABNORMAL LOW
Potassium: 3.5
Potassium: 3.5
Potassium: 4
Potassium: 4.2
Potassium: 4.3
Potassium: 4.3
Sodium: 123 — ABNORMAL LOW
Sodium: 130 — ABNORMAL LOW
Sodium: 132 — ABNORMAL LOW
Sodium: 132 — ABNORMAL LOW
Sodium: 134 — ABNORMAL LOW
Sodium: 135
Sodium: 136
Sodium: 137

## 2010-10-20 LAB — ANAEROBIC CULTURE

## 2010-10-20 LAB — PHOSPHORUS
Phosphorus: 1.4 — ABNORMAL LOW
Phosphorus: 2 — ABNORMAL LOW
Phosphorus: 2.2 — ABNORMAL LOW
Phosphorus: 2.7

## 2010-10-20 LAB — WOUND CULTURE

## 2010-10-20 LAB — CULTURE, BLOOD (ROUTINE X 2)
Culture: NO GROWTH
Culture: NO GROWTH

## 2010-10-20 LAB — URINALYSIS, ROUTINE W REFLEX MICROSCOPIC
Glucose, UA: >1000 — AB
Leukocytes, UA: NEGATIVE
Protein, ur: 30 — AB
Specific Gravity, Urine: 1.026
pH: 5

## 2010-10-20 LAB — CARDIAC PANEL(CRET KIN+CKTOT+MB+TROPI)
CK, MB: 4.2 — ABNORMAL HIGH
Relative Index: 1.9
Relative Index: 2.3
Total CK: 224
Troponin I: 0.02

## 2010-10-20 LAB — COMPREHENSIVE METABOLIC PANEL WITH GFR
ALT: 69 — ABNORMAL HIGH
AST: 80 — ABNORMAL HIGH
Albumin: 2.3 — ABNORMAL LOW
Alkaline Phosphatase: 116
BUN: 16
CO2: 14 — ABNORMAL LOW
Calcium: 9.5
Chloride: 94 — ABNORMAL LOW
Creatinine, Ser: 1.24
GFR calc non Af Amer: >60
Glucose, Bld: 283 — ABNORMAL HIGH
Potassium: 3.2 — ABNORMAL LOW
Sodium: 125 — ABNORMAL LOW
Total Bilirubin: 1.3 — ABNORMAL HIGH
Total Protein: 6.5

## 2010-10-20 LAB — BLOOD GAS, ARTERIAL
Acid-Base Excess: 3.8 — ABNORMAL HIGH
Acid-base deficit: 12.7 — ABNORMAL HIGH
Acid-base deficit: 3.1 — ABNORMAL HIGH
Bicarbonate: 13.1 — ABNORMAL LOW
Bicarbonate: 19 — ABNORMAL LOW
Bicarbonate: 20.8
Bicarbonate: 27.2 — ABNORMAL HIGH
Bicarbonate: 30.2 — ABNORMAL HIGH
Drawn by: 235321
Drawn by: 277551
Drawn by: 276051
Drawn by: 29943
FIO2: 0.3
FIO2: 0.4
MECHVT: 600
O2 Content: 4
O2 Saturation: 93.9
O2 Saturation: 96.8
O2 Saturation: 96.7
O2 Saturation: 97
PEEP: 5
PEEP: 5
PEEP: 5
Patient temperature: 97.7
Patient temperature: 98.6
Patient temperature: 98.6
Patient temperature: 99.4
Pressure support: 5
Pressure support: 5
RATE: 14
TCO2: 10.8
TCO2: 11.9
pCO2 arterial: 24.9 — ABNORMAL LOW
pCO2 arterial: 28 — ABNORMAL LOW
pCO2 arterial: 35.6
pCO2 arterial: 42.2
pCO2 arterial: 45.8 — ABNORMAL HIGH
pH, Arterial: 7.293 — ABNORMAL LOW
pH, Arterial: 7.407
pH, Arterial: 7.436
pH, Arterial: 7.44
pH, Arterial: 7.461 — ABNORMAL HIGH
pO2, Arterial: 240 — ABNORMAL HIGH
pO2, Arterial: 85.4
pO2, Arterial: 89.5
pO2, Arterial: 91.5

## 2010-10-20 LAB — URINE MICROSCOPIC-ADD ON

## 2010-10-20 LAB — CARBOXYHEMOGLOBIN: Carboxyhemoglobin: 1.5

## 2010-10-20 LAB — OSMOLALITY: Osmolality: 271 — ABNORMAL LOW

## 2010-10-20 LAB — CROSSMATCH

## 2010-10-20 LAB — POCT I-STAT GLUCOSE: Glucose, Bld: 115 — ABNORMAL HIGH

## 2010-10-20 LAB — HEMOGLOBIN A1C
Hgb A1c MFr Bld: 11.1 — ABNORMAL HIGH
Mean Plasma Glucose: 318

## 2010-10-20 LAB — URINE CULTURE

## 2010-10-20 LAB — PROTIME-INR
INR: 1.2
Prothrombin Time: 15.8 — ABNORMAL HIGH

## 2010-10-20 LAB — ABO/RH: ABO/RH(D): O POS

## 2010-10-20 LAB — APTT: aPTT: 29

## 2010-10-20 LAB — MAGNESIUM
Magnesium: 1.8
Magnesium: 2.3

## 2010-10-20 LAB — T3, FREE: T3, Free: 2.5 (ref 2.3–4.2)

## 2010-10-20 LAB — LACTIC ACID, PLASMA: Lactic Acid, Venous: 3.1 — ABNORMAL HIGH

## 2010-10-20 LAB — KETONES, QUALITATIVE: Acetone, Bld: NEGATIVE

## 2010-10-20 LAB — VANCOMYCIN, TROUGH: Vancomycin Tr: 8.4

## 2010-10-20 LAB — CULTURE, ROUTINE-ABSCESS

## 2010-10-20 LAB — T4, FREE: Free T4: 0.89

## 2011-06-16 ENCOUNTER — Emergency Department (HOSPITAL_COMMUNITY)
Admission: EM | Admit: 2011-06-16 | Discharge: 2011-06-20 | Disposition: A | Discharge status: Home / Self Care | Payer: Self-pay | Attending: *Deleted | Admitting: *Deleted

## 2011-06-16 ENCOUNTER — Encounter (HOSPITAL_COMMUNITY): Payer: Self-pay | Admitting: *Deleted

## 2011-06-16 DIAGNOSIS — E119 Type 2 diabetes mellitus without complications: Secondary | ICD-10-CM | POA: Insufficient documentation

## 2011-06-16 DIAGNOSIS — F200 Paranoid schizophrenia: Secondary | ICD-10-CM

## 2011-06-16 DIAGNOSIS — IMO0002 Reserved for concepts with insufficient information to code with codable children: Secondary | ICD-10-CM | POA: Insufficient documentation

## 2011-06-16 DIAGNOSIS — F29 Unspecified psychosis not due to a substance or known physiological condition: Secondary | ICD-10-CM | POA: Insufficient documentation

## 2011-06-16 DIAGNOSIS — F411 Generalized anxiety disorder: Secondary | ICD-10-CM | POA: Insufficient documentation

## 2011-06-16 DIAGNOSIS — F17200 Nicotine dependence, unspecified, uncomplicated: Secondary | ICD-10-CM | POA: Insufficient documentation

## 2011-06-16 HISTORY — DX: Essential (primary) hypertension: I10

## 2011-06-16 LAB — ETHANOL: Alcohol, Ethyl (B): <11 mg/dL (ref 0–11)

## 2011-06-16 LAB — CBC
HCT: 48.9 % (ref 39.0–52.0)
MCHC: 32.7 g/dL (ref 30.0–36.0)
MCV: 81.1 fL (ref 78.0–100.0)
RDW: 14.5 % (ref 11.5–15.5)

## 2011-06-16 LAB — RAPID URINE DRUG SCREEN, HOSP PERFORMED
Amphetamines: NOT DETECTED
Opiates: NOT DETECTED

## 2011-06-16 LAB — COMPREHENSIVE METABOLIC PANEL
Albumin: 3.9 g/dL (ref 3.5–5.2)
BUN: 13 mg/dL (ref 6–23)
Chloride: 93 mEq/L — ABNORMAL LOW (ref 96–112)
Creatinine, Ser: 0.79 mg/dL (ref 0.50–1.35)
GFR calc Af Amer: >90 mL/min (ref >90)
Total Bilirubin: 0.2 mg/dL — ABNORMAL LOW (ref 0.3–1.2)
Total Protein: 7.1 g/dL (ref 6.0–8.3)

## 2011-06-16 LAB — GLUCOSE, CAPILLARY: Glucose-Capillary: 248 mg/dL — ABNORMAL HIGH (ref 70–99)

## 2011-06-16 MED ORDER — METFORMIN HCL 500 MG PO TABS
500.0000 mg | ORAL_TABLET | Freq: Once | ORAL | Status: AC
  Administered 2011-06-16: 500 mg via ORAL
  Filled 2011-06-16: qty 1

## 2011-06-16 MED ORDER — ONDANSETRON HCL 4 MG PO TABS: 4.0000 mg | ORAL_TABLET | Freq: Three times a day (TID) | ORAL | Status: DC | PRN

## 2011-06-16 MED ORDER — ZOLPIDEM TARTRATE 5 MG PO TABS: 5.0000 mg | ORAL_TABLET | Freq: Every evening | ORAL | Status: DC | PRN

## 2011-06-16 MED ORDER — LORAZEPAM 1 MG PO TABS
1.0000 mg | ORAL_TABLET | Freq: Three times a day (TID) | ORAL | Status: DC | PRN
  Administered 2011-06-16: 1 mg via ORAL
  Filled 2011-06-16: qty 1

## 2011-06-16 MED ORDER — ZIPRASIDONE HCL 20 MG PO CAPS
40.0000 mg | ORAL_CAPSULE | Freq: Two times a day (BID) | ORAL | Status: DC
  Administered 2011-06-16 – 2011-06-20 (×9): 40 mg via ORAL
  Filled 2011-06-16 (×9): qty 2

## 2011-06-16 MED ORDER — SODIUM CHLORIDE 0.9 % IV BOLUS (SEPSIS)
2000.0000 mL | Freq: Once | INTRAVENOUS | Status: AC
  Administered 2011-06-17: 2000 mL via INTRAVENOUS

## 2011-06-16 MED ORDER — METFORMIN HCL 500 MG PO TABS
500.0000 mg | ORAL_TABLET | Freq: Two times a day (BID) | ORAL | Status: DC
  Administered 2011-06-17 – 2011-06-20 (×8): 500 mg via ORAL
  Filled 2011-06-16 (×12): qty 1

## 2011-06-16 MED ORDER — NICOTINE 21 MG/24HR TD PT24
21.0000 mg | MEDICATED_PATCH | Freq: Every day | TRANSDERMAL | Status: DC
  Filled 2011-06-16 (×2): qty 1

## 2011-06-16 NOTE — BHH Counselor (Signed)
TC from [facility] stating they would recommend pursuing VA route before they would consider pt.  Faxed info to [place] and [place] for review.

## 2011-06-16 NOTE — ED Notes (Signed)
Report called to TCU pt to go there to receive bolus and then return to the psych ed

## 2011-06-16 NOTE — ED Notes (Addendum)
CSW contacted [place] to confirm that the pt has VA benefits, Pt information has been sent to [facility] for review. Pt also requested that his information be sent to the [facility], however per [place] the AOD, the hospital is on psych diversion and there are no beds available.   Pt currently pends disposition.   11:00pm: Crystal, AOD at [facility], stated that there is a bed available for the pt however his "glucose and sodium had to be stable before he could be accepted". Crystal stated that the unit is not a medical unit and those concerns could not be managed there. Crystal requested that once the labs are stable, to fax a copy to the [place] on 06/17/11. EDP has been notified of this request.   Oncoming staff to follow up.

## 2011-06-16 NOTE — ED Notes (Signed)
Notified RN, Joni Reining of [facility] 283.

## 2011-06-16 NOTE — ED Provider Notes (Signed)
Pt alert ,pleasnt cooperative . Noted to be hyperglycemic and mildly hyponatremic  Will hydrate with NSS and recheck  Doug Sou, MD 06/16/11 2314

## 2011-06-16 NOTE — ED Notes (Signed)
Pt. Became agitated while talking with Dr. Alben Spittle, security went to pt.'s room to stand by.  Pt. Given Ativan, 1mg .

## 2011-06-16 NOTE — ED Notes (Signed)
Per Lloyd Huger at [facility], [facility] will check later today on pt.'s behavior to possibly go to a 400 bed at [facility].  ACT team notified.
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
## 2011-06-16 NOTE — BHH Counselor (Signed)
TC from [facility]. Requesting for pt to be started on Geodon (pt has been), want repeat sodium completed and stable blood sugar & vitals. Then they will reconsider for admission.  TC from [place] [facility]. Wanted repeat glucose and let them know if it comes down and they will continue to run pt for possible placement.
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
## 2011-06-16 NOTE — ED Provider Notes (Addendum)
Patient is alert, agitated, and aggressive. He was able to be redirected. He has been assessed by the ACT. Placement is pending.  Marc Melter, MD 06/16/11 1021   Daily Assessment: Today. The patient is calm, cooperative, and comfortable. He still has poor insight into why he is here. His CBGs have been elevated despite being on metformin 500 mg twice a day. He is eating a low carb diet and has normal vital signs. Metformin increased to 1000 mg twice a day. Of note, he did not have a previous diagnosis of diabetes. His disposition destination is not clear.  Marc Melter, MD 06/20/11 1014
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
## 2011-06-17 LAB — BASIC METABOLIC PANEL
BUN: 11 mg/dL (ref 6–23)
Chloride: 102 mEq/L (ref 96–112)
GFR calc Af Amer: >90 mL/min (ref >90)
Potassium: 4.1 mEq/L (ref 3.5–5.1)
Sodium: 136 mEq/L (ref 135–145)

## 2011-06-17 LAB — GLUCOSE, CAPILLARY
Glucose-Capillary: 287 mg/dL — ABNORMAL HIGH (ref 70–99)
Glucose-Capillary: 298 mg/dL — ABNORMAL HIGH (ref 70–99)

## 2011-06-17 NOTE — ED Notes (Signed)
AOZ:[id number]<HQ> Expected date:<BR> Expected time:<BR> Means of arrival:<BR> Comments:<BR> Hold for pt in tcu bed 31 will return to room when bolus completed

## 2011-06-17 NOTE — ED Notes (Signed)
Patient's glucose lab results from 8:44am continue to read abnormal and elevated. Once glucose level is stabilized pt's recent labs will be faxed to AOD at [facility] for disposition.   Janann Colonel., MSW, [facility] Clinical Social Worker [phone]

## 2011-06-17 NOTE — ED Provider Notes (Signed)
Patient is asleep.  Per RN / staff there were no acute changes /complaints overnight.  Placement is pending.  Gerhard Munch, MD 06/17/11 1058

## 2011-06-17 NOTE — ED Notes (Signed)
Pt moved to tcu bed 31

## 2011-06-17 NOTE — Progress Notes (Signed)
Bolus infusion completed, Psych RN notified. Pt escorted back to Psych ED by NT Marcelino Duster).  RAC #20g removed.., pt maintained in paper scrubs.  Pt returned to Psych ED without incident.

## 2011-06-18 LAB — GLUCOSE, CAPILLARY: Glucose-Capillary: 234 mg/dL — ABNORMAL HIGH (ref 70–99)

## 2011-06-18 NOTE — BH Assessment (Signed)
Assessment Note   Marc [name] is an 56 y.o. male. Per previous assessment: who was bib police under IVC. Per IVC papers, pt has a history of schizophrenia, is non compliant with medications, and has been verbally aggressive towards family. Pt believes former coworkers from [employer] planted a machine in his back that listens to his conversations. He states that it has access to his medical records and send information to his former coworker. 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  Per pt's wife, Marc [name] [phone]), he has a history of schizophrenia and has been hospitalized at [facility] and the [place] over the past 3 years. She states pt's last hospitalization was in 2011 at the [facility].She states pt stayed in treatment for 4 months. She states pt is not currently on any medication, has no psychiatrist, and no therapist. She further explains that pt has become aggressive towards her son, verbally threatening and gesturing towards him. Pt lives with wife, son, daughter, and grandchildren. Pt's wife is concerned for the safety of her family at this time. Pt appeared agitated and became easily upset when talking about [employer]   During reassessment, patient was observed to be calm and cooperative. Patient was oriented in all spheres. Patient continues to endorse tactile disturbances, stating " I feel like its pumping up an down my back above my buttocks. It's making me use the bathroom a lot. I  know that [employer] is doing this to me. I got plenty of sense." Patient reported to writer that he has been experiencing this abnormal feeling in his back since the year of 2008. Patient stated that he is not currently having any auditory or visual hallucinations; however, he is ready for his back to return to normal. "I'm just tired of it. They're going to take this thing out of my back. I feel like they're playing games with me."   Patient's glucose level continues to read abnormal and elevated. Recent glucose level was faxed to [facility] to review as possible disposition.   Axis I: Psychotic Disorder NOS Axis II: Deferred Axis III:  Past Medical History  Diagnosis Date  . Hypertension    Axis IV: other psychosocial or environmental problems Axis V: 31-40 impairment in reality testing  Past Medical History:  Past Medical History  Diagnosis Date  . Hypertension     History reviewed. No pertinent past surgical history.  Family History: History reviewed. No pertinent family history.  Social History:  reports that he has been smoking.  He does not have any smokeless tobacco history on file. He reports that he drinks alcohol. His drug history not on file.  Additional Social History:  Alcohol / Drug Use History of alcohol / drug use?: No history of alcohol / drug abuse  CIWA: CIWA-Ar BP: 168/85 mmHg Pulse Rate: 97  COWS:    Allergies: No Known Allergies  Home Medications:  (Not in a hospital admission)  OB/GYN  Status:  No LMP for male patient.  General Assessment Data [facility] Living Arrangements: Spouse/significant other;Children Can pt return to current living arrangement?: Yes Admission Status: Involuntary Is patient capable of signing voluntary admission?: No Transfer from: Acute Hospital Referral Source: Self/Family/Friend  Education Status Is patient currently in school?: No  Risk to self Suicidal Ideation:  No Suicidal Intent: No Is patient at risk for suicide?: No Suicidal Plan?: No Access to Means: No What has been your use of drugs/alcohol within the last 12 months?: denies Previous Attempts/Gestures: No How many times?: 0  Other Self Harm Risks: None  Triggers for Past Attempts: None known Intentional Self Injurious Behavior: None Family Suicide History: No Recent stressful life event(s): Conflict (Comment);Job Loss (Relational conflict with family; job loss) Persecutory voices/beliefs?: Yes Depression: No Depression Symptoms: Feeling angry/irritable Substance abuse history and/or treatment for substance abuse?: No Suicide prevention information given to non-admitted patients: Not applicable  Risk to Others Homicidal Ideation: No Thoughts of Harm to Others: No Current Homicidal Intent: No Current Homicidal Plan: No Access to Homicidal Means: No Identified Victim: N/A History of harm to others?: No Assessment of Violence: None Noted Violent Behavior Description: Cooperative  Does patient have access to weapons?: No Criminal Charges Pending?: No Does patient have a court date: No  Psychosis Hallucinations: Tactile Delusions: Persecutory  Mental Status Report Appear/Hygiene: Disheveled Eye Contact: Good Motor Activity: Freedom of movement Speech: Argumentative Level of Consciousness: Alert Mood: Suspicious;Anxious Affect: Apprehensive;Anxious Anxiety Level: None Thought Processes: Coherent;Relevant Judgement: Impaired Orientation: Person;Place;Time;Situation Obsessive Compulsive Thoughts/Behaviors: None  Cognitive Functioning Concentration: Normal Memory: Recent Intact;Remote Intact IQ: Average Insight: Poor Impulse Control: Poor Appetite: Good Weight Loss: 0  Weight Gain: 0  Sleep: Decreased Vegetative Symptoms: None  ADLScreening [facility] Services) Patient's cognitive ability adequate to safely complete daily activities?: Yes Patient able to  express need for assistance with ADLs?: Yes Independently performs ADLs?: Yes  Abuse/Neglect [facility]) Physical Abuse: Denies Verbal Abuse: Denies Sexual Abuse: Denies  Prior Inpatient Therapy Prior Inpatient Therapy: Yes Prior Therapy Dates: 2011 [facility] Reason for Treatment: Psych  Prior Outpatient Therapy Prior Outpatient Therapy: No Prior Therapy Dates: N/A Prior Therapy Facilty/Provider(s): N/A Reason for Treatment: N/A  ADL Screening (condition at time of admission) Patient's cognitive ability adequate to safely complete daily activities?: Yes Patient able to express need for assistance with ADLs?: Yes Independently performs ADLs?: Yes Weakness of Legs: None Weakness of Arms/Hands: None  Home Assistive Devices/Equipment Home Assistive Devices/Equipment: None  Therapy Consults (therapy consults require a physician order) PT Evaluation Needed: No OT Evalulation Needed: No SLP Evaluation Needed: No Abuse/Neglect Assessment (Assessment to be complete while patient is alone) Physical Abuse: Denies Verbal Abuse: Denies Sexual Abuse: Denies Exploitation of patient/patient's resources: Denies Self-Neglect: Denies Values / Beliefs Cultural Requests During Hospitalization: None Spiritual Requests During Hospitalization: None Consults Spiritual Care Consult Needed: No Social Work Consult Needed: No Merchant navy officer (For Healthcare) Advance Directive: Patient does not have advance directive;Patient would not like information Pre-existing out of facility DNR order (yellow form or pink MOST form): No Nutrition Screen Diet: Regular Unintentional weight loss greater than 10lbs within the last month: No Problems chewing or swallowing foods and/or liquids: No Home Tube Feeding or Total Parenteral Nutrition (TPN): No Patient appears severely malnourished: No  Additional Information 1:1 In Past 12 Months?: No CIRT Risk: No Elopement Risk:  No Does patient have medical clearance?: Yes     Disposition: [facility]  [facility] Disposition Disposition of Patient: Inpatient treatment program Type of inpatient treatment program: Adult Patient referred to: Other (Comment) ([facility], [facility], [facility])  On Site Evaluation by:  Self Reviewed with Physician:     Haskel Khan 06/18/2011 4:45 PM

## 2011-06-18 NOTE — ED Notes (Addendum)
TC with Crystal from [employer]. Crystal inquired if writer was still seeking placement for pt. Writer informed Crystal that pt is still currently seeking placement at this time. Crystal requested Advertising account planner pt's most recent glucose levels to facility so that referral can be reviewed with MD. Writer informed Crystal that pt's glucose level is abnormal at this time; however, per RN pt is receiving medications to stabilize it. Crystal informed Clinical research associate to fax most recent glucose level for referral and that she will document pt's issue is currently being addressed.  CSW will fax most recent lab for pt's glucose level to [facility] (Fax: [phone]/ Phone: [phone])  Janann Colonel., MSW, [facility] Clinical Social Worker [phone]

## 2011-06-18 NOTE — ED Provider Notes (Signed)
BP 127/78  Pulse 84  Temp(Src) 98 F (36.7 C) (Oral)  Resp 18  SpO2 95% Pt resting well. No acute issues overnight.   Loren Racer, MD 06/18/11 [phone]

## 2011-06-19 LAB — GLUCOSE, CAPILLARY
Glucose-Capillary: 257 mg/dL — ABNORMAL HIGH (ref 70–99)
Glucose-Capillary: 299 mg/dL — ABNORMAL HIGH (ref 70–99)

## 2011-06-19 NOTE — ED Notes (Signed)
V/S done at 1315.

## 2011-06-19 NOTE — ED Provider Notes (Signed)
Medical screening examination/treatment/procedure(s) were performed by non-physician practitioner and as supervising physician I was immediately available for consultation/collaboration.  Aryan Bello T Demian Maisel, MD 06/19/11 1454 

## 2011-06-19 NOTE — ED Notes (Signed)
CSW contacted AOD Kerby Nora at the [facility] to review pt's current glucose levels. Pt's glucose continues to be in the "abnormal range" however is steadily coming down per unit RN. At this time, Kerby Nora states that he is unable to review this information and oncoming CSW/ACT will need to contact AOD Joey during the morning shift.

## 2011-06-19 NOTE — ED Notes (Signed)
Patient awakens easily.  He responds "I'm feeling fine, doc," when asked how he is doing. Patient is awaiting placement, likely at [place].   Marc Munch, MD 06/19/11 1005

## 2011-06-20 DIAGNOSIS — F22 Delusional disorders: Secondary | ICD-10-CM

## 2011-06-20 LAB — GLUCOSE, CAPILLARY
Glucose-Capillary: 241 mg/dL — ABNORMAL HIGH (ref 70–99)
Glucose-Capillary: 197 mg/dL — ABNORMAL HIGH (ref 70–99)
Glucose-Capillary: 211 mg/dL — ABNORMAL HIGH (ref 70–99)

## 2011-06-20 MED ORDER — METFORMIN HCL 500 MG PO TABS
500.0000 mg | ORAL_TABLET | Freq: Once | ORAL | Status: AC
  Administered 2011-06-20: 500 mg via ORAL
  Filled 2011-06-20: qty 1

## 2011-06-20 MED ORDER — METFORMIN HCL 1000 MG PO TABS: 1000.0000 mg | ORAL_TABLET | Freq: Two times a day (BID) | ORAL | Status: DC

## 2011-06-20 MED ORDER — ZIPRASIDONE HCL 40 MG PO CAPS: 40.0000 mg | ORAL_CAPSULE | Freq: Two times a day (BID) | ORAL | Status: DC

## 2011-06-20 MED ORDER — METFORMIN HCL 500 MG PO TABS
1000.0000 mg | ORAL_TABLET | Freq: Two times a day (BID) | ORAL | Status: DC
  Administered 2011-06-20: 1000 mg via ORAL
  Filled 2011-06-20 (×2): qty 2

## 2011-06-20 NOTE — ED Notes (Signed)
Pt. Attempted to call wife again, left message on her voicemail for her to come pick him up that he is D/C'ed.

## 2011-06-20 NOTE — ED Notes (Signed)
Pt. D/C'ed, pt. Attempted to call his wife for transportation home, no answer.  Pt. Will eat his dinner and attempt to contact after dinner.

## 2011-06-20 NOTE — ED Notes (Signed)
Patient ambulating down hallway.

## 2011-06-20 NOTE — Discharge Instructions (Signed)
Paranoia Paranoia is a distrust of others that is not based on a real reason for distrust. This may reach delusional levels. This means the paranoid person feels the world is against them when there is no reason to make them feel that way. People with paranoia feel as though people around them are "out to get them".  SIMILAR MENTAL ILNESSES  Depression is a feeling as though you are down all the time. It is normal in some situations where you have just lost a loved one. It is abnormal if you are having feelings of paranoia with it.   Dementia is a physical problem with the brain in which the brain no longer works properly. There are problems with daily activities of living. Alzheimer's disease is one example of this. Dementia is also caused by old age changes in the brain which come with the death of brain cells and small strokes.   Paranoidschizophrenia. People with paranoid schizophrenia and persecutory delusional disorder have delusions in which they feel people around them are plotting against them. Persecutory delusions in paranoid schizophrenia are bizarre, sometimes grandiose, and often accompanied by auditory hallucinations. This means the person is hearing voices that are not there.   Delusionaldisorder (persecutory type). Delusions experienced by individuals with delusional disorder are more believable than those experienced by paranoid schizophrenics; they are not bizarre, though still unjustified. Individuals with delusional disorder may seem offbeat or quirky rather than mentally ill, and therefore, may never seek treatment.  All of these problems usually do not allow these people to interact socially in an acceptable manner. CAUSES The cause of paranoia is often not known. It is common in people with extended abuse of:  Cocaine.   Amphetamine.   Marijuana.   Alcohol.  Sometimes there is an inherited tendency. It may be associated with stress or changes in brain  chemistry. DIAGNOSIS  When paranoia is present, your caregiver may:  Refer you to a specialist.   Do a physical exam.   Perform other tests on you to make sure there are not other problems causing the paranoia including:   Physical problems.   Mental problems.   Chemical problems (other than drugs).  Testing may be done to determine if there is a psychiatric disability present that can be treated with medicine. TREATMENT   Paranoia that is a symptom of a psychiatric problem should be treated by professionals.   Medicines are available which can help this disorder. Antipsychotic medicine may be prescribed by your caregiver.   Sometimes psychotherapy may be useful.   Conditions such as depression or drug abuse are treated individually. If the paranoia is caused by drug abuse, a treatment facility may be helpful. Depression may be helped by antidepressants.  PROGNOSIS   Paranoid people are difficult to treat because of their belief that everyone is out to get them or harm them. Because of this mistrust, they often must be talked into entering treatment by a trusted family member or friend. They may not want to take medicine as they may see this as an attempt to poison them.   Gradual gains in the trust of a therapist or caregiver helps in a successful treatment plan.   Some people with PPD or persecutory delusional disorder function in society without treatment in limited fashion.  Document Released: 12/29/2002 Document Revised: 12/15/2010 Document Reviewed: 09/03/2007 [facility] Patient Information 2012 [place], [place].Hallucinations and Delusions You seem to be having hallucinations and/or delusions. You may be hearing voices that no one else  can hear. This can seem very real to you. You may be having thoughts and fears that do not make sense to others. This condition can be due to mental disease like schizophrenia. It may be caused by a medical condition, such as an infection or  electrolyte disturbance. These symptoms are also seen in drug abusers, especially those who use crack cocaine and amphetamines. Drugs like PCP, LSD, MDMA, peyote, and psilocybin can also cause frightening hallucinations and loss of control. If your symptoms are due to drug abuse, your mental state should improve as the drug(s) leave your system. Someone you trust should be with you until you are better to protect you and calm your fears. Often tranquilizers are very helpful at controlling hallucinations, anxiety, and destructive behavior. Getting a proper diet and enough sleep is important to recovery. If your symptoms are not due to drugs, or do not improve over several days after stopping drug use, you need further medical or mental health care. SEEK IMMEDIATE MEDICAL CARE IF:   Your symptoms get worse, especially if you think your life is in danger   You have violent or destructive thoughts.  Recovery is possible, but you have to get proper treatment and avoid drugs that are known to cause you trouble. Document Released: 02/03/2004 Document Revised: 12/15/2010 Document Reviewed: 12/26/2004 [facility] Patient Information 2012 [place], [place].  RESOURCE GUIDE  Chronic Pain Problems: Contact [facility]  [phone] Patients need to be referred by their primary care doctor.  Insufficient Money for Medicine: Contact United Way:  call "211" or [facility] [phone].  No Primary Care Doctor: - Call Health Connect  [phone] - can help you locate a primary care doctor that  accepts your insurance, provides certain services, etc. - Physician Referral [phone]  Agencies that provide inexpensive medical care: - [facility]  [phone] - [facility]  [phone] - [facility]  [phone] [facility]  [phone] - Planned Parenthood  [phone] [facility]  [phone]  Medicaid-accepting [facility] Providers: - [facility]- [address]  [phone], Mon-Fri 9am-7pm, Sat 9am-1pm - [facility]- [address] [place], [address] [place]  [phone] - [facility]- [address], [address] [place]  [phone] [facility]- [address]  [phone] - Renaye Rakers- [address] [place], [address], [phone]  Only accepts [place] patients after they have their name  applied to their card  Self Pay (no insurance) in [place]: - Sickle Cell Patients: Dr Willey Blade, [facility]  [address] [place], [phone] - [facility]- [address] [place]  [phone]       [facility]- [address]       -     [facility]- see information above (Speak to [employer] if you do not have insurance)       -  [facility]- [address] [place], [phone]       -  [facility]- [address],  [phone]       -  [facility]- [address] [phone]       -  Dr Julio Sicks-  [address], [place], [phone]       -  [facility]- [address] [phone]       -  [facility]- [address] [phone], also [address] [phone]       -    [facility]- [address] [place], [phone], 1st & 3rd Saturday   every month, 10am-1pm  1) Find a Doctor and Pay Out of Pocket Although you won't have to find out who is covered by your insurance plan, it is a good idea to ask around and get recommendations. You will then need to call the office and see if the doctor you have chosen will accept you as a new patient and what types of options they offer for patients who are self-pay. Some doctors offer discounts or will set up payment plans for their patients who do not have insurance, but you will need to ask so you aren't surprised when you get to your  appointment.  2) Contact Your Local Health Department Not all health departments have doctors that can see patients for sick visits, but many do, so it is worth a call to see if yours does. If you don't know where your local health department is, you can check in your phone book. The CDC also has a tool to help you locate your state's health department, and many state websites also have listings of all of their local health departments.  3) Find a Walk-in Clinic If your illness is not likely to be very severe or complicated, you may want to try a walk in clinic. These are popping up all over the country in pharmacies, drugstores, and shopping centers. They're usually staffed by nurse practitioners or physician assistants that have been trained to treat common illnesses and complaints. They're usually fairly quick and inexpensive. However, if you have serious medical issues or chronic medical problems, these are probably not your best option  STD Testing - [facility] Department of [facility], STD Clinic, [address] [place], phone [phone] or [phone].  Monday - Friday, call for an appointment. [facility] Department of [employer], STD Clinic, [address], [place], phone [phone] or [phone].  Monday - Friday, call for an appointment.  Abuse/Neglect: [facility] Child Abuse Hotline 657-822-3252 [facility] Child Abuse Hotline (872)578-1312 (After Hours)  Emergency Shelter:  [facility] 506-779-9961  Maternity Homes: - [facility] at [facility] [place] [facility] (315)[phone] - [facility] [phone]  MRSA Hotline #:   820-120-6425  [facility] Resources  [facility]  [facility] Dept. [address]                 [address]         [address] [place] [address]  [place]                                               Cristobal Goldmann Phone:  [phone]  Phone:  [phone]                   Phone:  [phone]  [facility], [phone] - [facility] - [phone]       -     [facility] in [place], [address],                                  [phone], [facility] Child Abuse Hotline (860) 455-2964 or 502-689-3456 (After Hours)   Behavioral Health Services  Substance Abuse Resources: - [facility] and Drug Services  [phone] - [facility] [phone] - [facility] (308) [phone] Floydene Flock [phone] - Residential & Outpatient Substance Abuse Program  [phone]  Psychological Services: [facility]  ([phone] [facility] Services  [phone] - [facility], ([phone] [place]. [address], [place], ACCESS LINE: 520-821-5858 or 9726339922, [email or web address]  Dental Assistance  If unable to pay or uninsured, contact:  [facility] or [facility] to become qualified for the adult dental clinic.  Patients with Medicaid: [facility] [phone] [address], [phone] [address] [phone]  If unable to pay, or uninsured, contact [facility] (202) 128-3368) or [facility] Department [phone] in [place], [phone] in [facility]) to become qualified for the adult dental clinic  Other Low-Cost Community Dental Services: - [facility]- [address] [place], [place], [place], [address], [phone], [address]. [address], 2nd and 4th Thursday of the month at 6:30am.  10 clients each day by appointment, can sometimes see walk-in patients if someone does not show for an appointment. [facility]- [address] [place], [place], [address], [phone] - [facility]- [address], [place], [place], [address],  [phone] - [facility]- [phone] [facility]- [phone] [facility] Department- [phone]

## 2011-06-20 NOTE — ED Provider Notes (Addendum)
I did not examine the patient. Asked by SW/ACT to rescind IVC as it was not complete prior to his discharge. Chart reviewed. He was examined and recommended for d/c by psychiatry and discharged home. Rescind IVC completed retrospectively.   Forbes Cellar, MD 06/20/11 1951  Forbes Cellar, MD 06/20/11 4098

## 2011-06-20 NOTE — Consult Note (Signed)
Reason for Consult: Psychosis, paranoia noncompliance Referring Physician: Dr. Oliva Bustard is an 56 y.o. male.  HPI: Patient was seen and the chart reviewed. Patient has been showing significant improvement with his antipsychotic medication during the emergency department stay. Patient has denied any current symptoms of depression, anxiety or psychosis. Patient has no paranoia or agitation or aggressive behaviors. Patient has been compliant with his medications without adverse effects. This is requesting to be discharged to home and willing to followup with outpatient psychiatric services. Patient has been stable. Behaviorally and emotionally. His blood glucose levels for elevated need to be addressed as outpatient at primary care services.  Past Medical History  Diagnosis Date  . Hypertension     History reviewed. No pertinent past surgical history.  History reviewed. No pertinent family history.  Social History:  reports that he has been smoking.  He does not have any smokeless tobacco history on file. He reports that he drinks alcohol. His drug history not on file.  Allergies: No Known Allergies  Medications: I have reviewed the patient's current medications.  Results for orders placed during the hospital encounter of 06/16/11 (from the past 48 hour(s))  GLUCOSE, CAPILLARY     Status: Abnormal   Collection Time   06/18/11  6:24 PM      Component Value Range Comment   Glucose-Capillary 234 (*) 70 - 99 (mg/dL)   GLUCOSE, CAPILLARY     Status: Abnormal   Collection Time   06/19/11  1:44 PM      Component Value Range Comment   Glucose-Capillary 257 (*) 70 - 99 (mg/dL)   GLUCOSE, CAPILLARY     Status: Abnormal   Collection Time   06/19/11  4:19 PM      Component Value Range Comment   Glucose-Capillary 271 (*) 70 - 99 (mg/dL)   GLUCOSE, CAPILLARY     Status: Abnormal   Collection Time   06/19/11  5:54 PM      Component Value Range Comment   Glucose-Capillary 245 (*)  70 - 99 (mg/dL)   GLUCOSE, CAPILLARY     Status: Abnormal   Collection Time   06/19/11  9:41 PM      Component Value Range Comment   Glucose-Capillary 299 (*) 70 - 99 (mg/dL)    Comment 1 Documented in Chart      Comment 2 Notify RN     GLUCOSE, CAPILLARY     Status: Abnormal   Collection Time   06/20/11  7:35 AM      Component Value Range Comment   Glucose-Capillary 241 (*) 70 - 99 (mg/dL)   GLUCOSE, CAPILLARY     Status: Abnormal   Collection Time   06/20/11 12:08 PM      Component Value Range Comment   Glucose-Capillary 197 (*) 70 - 99 (mg/dL)     No results found.  No depression, No anxiety, No psychosis and Positive for aggressive behavior and Paranoid on arrival. Blood pressure 144/88, pulse 96, temperature 98.4 F (36.9 C), temperature source Oral, resp. rate 18, SpO2 96.00%.   Assessment/Plan: Paranoid, psychosis Noncompliance with treatment.  Patient has been stable on his current medication management and the no symptoms of for psychosis or paranoia at this time. Patient is willing to compliant with his medications and the willing to receive outpatient psychiatric services. Recommended discharged to home with the family and outpatient psychiatric services. May provide 2 weeks prescription medication. If needed. He has been taking Geodon 40 mg  2 times daily, metformin 1000 milligrams 2 times daily with meals  Javon Snee,JANARDHAHA R. 06/20/2011, 5:29 PM

## 2011-06-20 NOTE — Progress Notes (Signed)
Updated labs and progress notes faxed to Joey, Patient transfer coordinator at [facility].  Manson Passey Othniel Maret ANN S , MSW, LCSWA 06/20/2011 2:26 PM [phone]

## 2011-06-20 NOTE — ED Notes (Addendum)
Per [facility] team, [facility] requesting [facility] and possible EDP consult r/t pt.'s DM.  Notified Dr. Elsie Saas of request, Dr. Elsie Saas checked chart, labs done when pt. Admitted in ED, no new lab orders given.

## 2011-06-20 NOTE — ED Notes (Signed)
CSW was consulted by Jessie Foot with ACT to assist the pt in locating family members to assist with discharge. CSW attempted to contact pt's wife at his request. After multiple phone calls, CSW reached [place] pt's wife, who stated "he can't come back to her home because the family is afraid of him." CSW explained that the pt is medically and psychiatrically cleared for discharged and can not continue to remain in the ED. CSW encouraged [name] to call the police if she has difficulty with the pt once he is discharged. [name] made reference to the pt's son, [name], being "downtown" and possible taking out papers. CSW explained that in the event the family took out papers, it would most likely be rescinded since he has been cleared for discharge by a psychiatrist today. [name] then asked CSW to contact [name] at [phone] to discuss transportation. CSW explained again to [facility] that the pt is cleared for discharge and he verbalized understanding. He added that he would be at the [facility] by 8:00pm to pick up the pt. RN notified.   Consult back to CSW has needed.

## 2011-06-20 NOTE — ED Notes (Signed)
Per ACT team, contacted pt.'s son.  Son states that he will be here at 57.

## 2013-05-08 ENCOUNTER — Emergency Department (HOSPITAL_COMMUNITY)
Admission: EM | Admit: 2013-05-08 | Discharge: 2013-05-09 | Disposition: A | Discharge status: Other Acute Inpatient Hospital | Payer: Self-pay | Attending: Emergency Medicine | Admitting: Emergency Medicine

## 2013-05-08 ENCOUNTER — Encounter (HOSPITAL_COMMUNITY): Payer: Self-pay | Admitting: Emergency Medicine

## 2013-05-08 DIAGNOSIS — F17200 Nicotine dependence, unspecified, uncomplicated: Secondary | ICD-10-CM | POA: Insufficient documentation

## 2013-05-08 DIAGNOSIS — F909 Attention-deficit hyperactivity disorder, unspecified type: Secondary | ICD-10-CM | POA: Insufficient documentation

## 2013-05-08 DIAGNOSIS — Z79899 Other long term (current) drug therapy: Secondary | ICD-10-CM | POA: Insufficient documentation

## 2013-05-08 DIAGNOSIS — F319 Bipolar disorder, unspecified: Secondary | ICD-10-CM | POA: Insufficient documentation

## 2013-05-08 DIAGNOSIS — F209 Schizophrenia, unspecified: Secondary | ICD-10-CM | POA: Insufficient documentation

## 2013-05-08 DIAGNOSIS — F22 Delusional disorders: Secondary | ICD-10-CM | POA: Insufficient documentation

## 2013-05-08 DIAGNOSIS — I10 Essential (primary) hypertension: Secondary | ICD-10-CM | POA: Insufficient documentation

## 2013-05-08 HISTORY — DX: Schizophrenia, unspecified: F20.9

## 2013-05-08 HISTORY — DX: Bipolar disorder, unspecified: F31.9

## 2013-05-08 HISTORY — DX: Type 2 diabetes mellitus without complications: E11.9

## 2013-05-08 LAB — CBC WITH DIFFERENTIAL/PLATELET
Basophils Absolute: 0 10*3/uL (ref 0.0–0.1)
Basophils Relative: 0 % (ref 0–1)
EOS PCT: 1 % (ref 0–5)
Eosinophils Absolute: 0.1 10*3/uL (ref 0.0–0.7)
HEMATOCRIT: 47 % (ref 39.0–52.0)
Hemoglobin: 15.7 g/dL (ref 13.0–17.0)
LYMPHS ABS: 1.9 10*3/uL (ref 0.7–4.0)
LYMPHS PCT: 28 % (ref 12–46)
MCH: 26.7 pg (ref 26.0–34.0)
MCHC: 33.4 g/dL (ref 30.0–36.0)
MCV: 79.9 fL (ref 78.0–100.0)
MONO ABS: 0.6 10*3/uL (ref 0.1–1.0)
Monocytes Relative: 10 % (ref 3–12)
Neutro Abs: 4.1 10*3/uL (ref 1.7–7.7)
Neutrophils Relative %: 61 % (ref 43–77)
Platelets: 204 10*3/uL (ref 150–400)
RBC: 5.88 MIL/uL — AB (ref 4.22–5.81)
RDW: 14.9 % (ref 11.5–15.5)
WBC: 6.7 10*3/uL (ref 4.0–10.5)

## 2013-05-08 LAB — COMPREHENSIVE METABOLIC PANEL
ALT: 15 U/L (ref 0–53)
AST: 13 U/L (ref 0–37)
Albumin: 3.9 g/dL (ref 3.5–5.2)
Alkaline Phosphatase: 93 U/L (ref 39–117)
BUN: 13 mg/dL (ref 6–23)
CALCIUM: 9.6 mg/dL (ref 8.4–10.5)
CO2: 23 meq/L (ref 19–32)
CREATININE: 0.74 mg/dL (ref 0.50–1.35)
Chloride: 95 mEq/L — ABNORMAL LOW (ref 96–112)
GLUCOSE: 278 mg/dL — AB (ref 70–99)
Potassium: 4.2 mEq/L (ref 3.7–5.3)
Sodium: 134 mEq/L — ABNORMAL LOW (ref 137–147)
Total Bilirubin: 0.2 mg/dL — ABNORMAL LOW (ref 0.3–1.2)
Total Protein: 7.7 g/dL (ref 6.0–8.3)

## 2013-05-08 LAB — URINE MICROSCOPIC-ADD ON

## 2013-05-08 LAB — URINALYSIS, ROUTINE W REFLEX MICROSCOPIC
Bilirubin Urine: NEGATIVE
Hgb urine dipstick: NEGATIVE
Ketones, ur: NEGATIVE mg/dL
LEUKOCYTES UA: NEGATIVE
Nitrite: NEGATIVE
Protein, ur: NEGATIVE mg/dL
Specific Gravity, Urine: 1.035 — ABNORMAL HIGH (ref 1.005–1.030)
UROBILINOGEN UA: 1 mg/dL (ref 0.0–1.0)
pH: 5.5 (ref 5.0–8.0)

## 2013-05-08 LAB — ETHANOL

## 2013-05-08 LAB — RAPID URINE DRUG SCREEN, HOSP PERFORMED
Amphetamines: NOT DETECTED
BENZODIAZEPINES: NOT DETECTED
Barbiturates: NOT DETECTED
COCAINE: NOT DETECTED
Opiates: NOT DETECTED
Tetrahydrocannabinol: NOT DETECTED

## 2013-05-08 MED ORDER — ZOLPIDEM TARTRATE 5 MG PO TABS: 5.0000 mg | ORAL_TABLET | Freq: Every evening | ORAL | Status: DC | PRN

## 2013-05-08 MED ORDER — LORAZEPAM 1 MG PO TABS
1.0000 mg | ORAL_TABLET | Freq: Three times a day (TID) | ORAL | Status: DC | PRN
  Administered 2013-05-08: 1 mg via ORAL
  Filled 2013-05-08: qty 1

## 2013-05-08 MED ORDER — NICOTINE 21 MG/24HR TD PT24: 21.0000 mg | MEDICATED_PATCH | Freq: Every day | TRANSDERMAL | Status: DC

## 2013-05-08 MED ORDER — ONDANSETRON HCL 4 MG PO TABS: 4.0000 mg | ORAL_TABLET | Freq: Three times a day (TID) | ORAL | Status: DC | PRN

## 2013-05-08 MED ORDER — ACETAMINOPHEN 325 MG PO TABS
650.0000 mg | ORAL_TABLET | ORAL | Status: DC | PRN
Start: 2013-05-08 — End: 2013-05-09

## 2013-05-08 MED ORDER — METFORMIN HCL 500 MG PO TABS
1000.0000 mg | ORAL_TABLET | Freq: Two times a day (BID) | ORAL | Status: DC
  Filled 2013-05-08 (×3): qty 2

## 2013-05-08 MED ORDER — ALUM & MAG HYDROXIDE-SIMETH 200-200-20 MG/5ML PO SUSP: 30.0000 mL | ORAL | Status: DC | PRN

## 2013-05-08 NOTE — ED Notes (Signed)
Patient denies SI, HI, AVH. States "I would never harm anyone" Patient states that [employer], [place] him and he will not give in and let them control him. States "I am not schizophrenic, I am not bipolar! I have a good memory." States his wife took out papers on him and he does not appreciate it. Feels [employer] is "pumping on my ass". Patient feels that his problems are revolved around this company. States his wife is annoyed that he won't give in.  Patient cooperative, anxious; restless.  Encouragement offered.  Patient safety maintained, Q 15 checks continue.

## 2013-05-08 NOTE — ED Notes (Signed)
Per [employer], wife took out papers stating he is a danger to self and others-thinks someone has put a pump in his butt

## 2013-05-08 NOTE — ED Notes (Addendum)
Pt. In new scrubs. Pt. And belongings searched and wanded by security. Pt. Has 1 belongings bag. Pt. Has sweat pant, shirt, socks, jacket and shoes, and t-shirt.

## 2013-05-08 NOTE — ED Provider Notes (Signed)
CSN: [id number]     Arrival date & time 05/08/13  1820 History  This chart was scribed for non-physician practitioner Arthor CaptainAbigail Jedidiah Demartini, PA-C working with Toy BakerAnthony T Allen, MD by Dorothey Basemania Sutton, ED Scribe. This patient was seen in room WTR3/WLPT3 and the patient's care was started at 6:35 PM.    Chief Complaint  Patient presents with  . Medical Clearance   The history is provided by the patient. No language interpreter was used.   HPI Comments: Marc [name] is a 58 y.o. Male with a history of bipolar I disorder and schizophrenic disorder who presents to the Emergency Department requesting medical clearance for aggressive behavior. His wife reported to [employer] that she believes the patient is a danger to himself and others. Patient states that he is here because "they want me to fix a house and I don't want to do that, so they're making me do all this stuff," which is why he believes his wife is angry with him. He states that a trucking company [employer]("[employer] wants him to fix the house and that they "have control of my money and want to take control of my wife and kids." Patient also states that there is "a hard plastic pump in my butt that is pumping 24/7." He also stated that he "would rather die than live with this pump," but that he does not actually have any suicidal ideations/intentions. Patient also states that when he had a routine medical procedure done "they put a chip in my back." He states that he did not have surgery, but that "the doctor scratched me to make me think I had surgery, but that's when they put the chip in my back." Patient believes that "[employer]" was also responsible for this. Review of patient's medical records indicate that the patient had a spinal epidural abscess in October 2008, which is the procedure he is likely referring to. Patient also had an ET tube placed because of this. He denies homicidal ideations or access to firearms. He denies any regular medication or  illicit drug use. Patient has a history of HTN. Review of patient's medical records also indicate a history of DM.    Past Medical History  Diagnosis Date  . Hypertension    No past surgical history on file. No family history on file. History  Substance Use Topics  . Smoking status: Current Every Day Smoker -- 1.00 packs/day  . Smokeless tobacco: Not on file  . Alcohol Use: Yes     Comment: very little    Review of Systems  Unable to perform ROS: Psychiatric disorder  Psychiatric/Behavioral: Negative for suicidal ideas.   Patient is unreliable, but denies any other pain or symptoms.  Allergies  Review of patient's allergies indicates no known allergies.  Home Medications   Prior to Admission medications   Medication Sig Start Date End Date Taking? Authorizing Provider  metFORMIN (GLUCOPHAGE) 1000 MG tablet Take 1 tablet (1,000 mg total) by mouth 2 (two) times daily. 06/20/11 06/19/12  Raeford RazorStephen Kohut, MD  ziprasidone (GEODON) 40 MG capsule Take 1 capsule (40 mg total) by mouth 2 (two) times daily with a meal. 06/20/11 09/18/11  Raeford RazorStephen Kohut, MD   Triage Vitals: BP 136/86  Pulse 112  Temp(Src) 98 F (36.7 C) (Oral)  Resp 18  SpO2 96%  Physical Exam  Nursing note and vitals reviewed. Constitutional: He is oriented to person, place, and time. He appears well-developed and well-nourished. No distress.  HENT:  Head: Normocephalic and  atraumatic.  Eyes: Conjunctivae are normal.  Neck: Normal range of motion. Neck supple.  Cardiovascular: Normal rate, regular rhythm and normal heart sounds.   Pulmonary/Chest: Effort normal and breath sounds normal. No respiratory distress.  Abdominal: He exhibits no distension.  Musculoskeletal: Normal range of motion.  Neurological: He is alert and oriented to person, place, and time.  Skin: Skin is warm and dry.  Surgical scar along the spine.  Psychiatric: His speech is tangential. He is hyperactive. Thought content is paranoid and  delusional.  Fixed paranoid delusion.     ED Course  Procedures (including critical care time)  DIAGNOSTIC STUDIES: Oxygen Saturation is 96% on room air, normal by my interpretation.    COORDINATION OF CARE: 6:44 PM- Ordered ethanol, CBC, CMP, UA, and urine drug screen. Discussed treatment plan with patient at bedside and patient verbalized agreement.     Labs Review Labs Reviewed  CBC WITH DIFFERENTIAL - Abnormal; Notable for the following:    RBC 5.88 (*)    All other components within normal limits  COMPREHENSIVE METABOLIC PANEL  URINALYSIS, ROUTINE W REFLEX MICROSCOPIC  URINE RAPID DRUG SCREEN (HOSP PERFORMED)  ETHANOL  CBG MONITORING, ED    Imaging Review No results found.   EKG Interpretation None      MDM   Final diagnoses:  Paranoid type delusional disorder    Patient with Fixated paranoid delusion. He is also diabetic and noncompliant with meds.  Med work up pending. He is IVC'd . This patient clearly needs inpatient psych admission.    I personally performed the services described in this documentation, which was scribed in my presence. The recorded information has been reviewed and is accurate.       Arthor CaptainAbigail Sreenidhi Ganson, PA-C 05/12/13 [phone]

## 2013-05-08 NOTE — ED Notes (Signed)
Spoke with NeurosurgeonMegan RN, will transport pt to secure area pending labs/will re-assess as needed after labs resulted

## 2013-05-09 ENCOUNTER — Encounter (HOSPITAL_COMMUNITY): Payer: Self-pay | Admitting: Emergency Medicine

## 2013-05-09 DIAGNOSIS — F200 Paranoid schizophrenia: Secondary | ICD-10-CM

## 2013-05-09 LAB — CBG MONITORING, ED
GLUCOSE-CAPILLARY: 235 mg/dL — AB (ref 70–99)
Glucose-Capillary: 308 mg/dL — ABNORMAL HIGH (ref 70–99)

## 2013-05-09 MED ORDER — METFORMIN HCL 500 MG PO TABS
1500.0000 mg | ORAL_TABLET | Freq: Once | ORAL | Status: AC
  Administered 2013-05-09: 1500 mg via ORAL

## 2013-05-09 NOTE — BH Assessment (Signed)
[facility] Assessment Progress Note  Pt seen by Dr. Lynnae SandhoffAkthar and Molli KnockJ. Lord, inpatient treatment recommended.  [place] [facility] who said they have 1 bed with one other patient reviewed.  [facility].

## 2013-05-09 NOTE — Progress Notes (Signed)
[facility] CL spoke with patient about community resources. Patient refused resources.

## 2013-05-09 NOTE — BH Assessment (Signed)
[facility] Assessment Progress Note  Pt's wife called to report that pt is is making threatening phone calls to her, saying, "Thank you for what you have done to me--I'm going to get you".

## 2013-05-09 NOTE — Consult Note (Signed)
[facility] Face-to-Face Psychiatry Consult   Reason for Consult:  Paranoid Referring Physician:  ED Physician  Marc [name] is an 58 y.o. male. Total Time spent with patient: 1 hour  Assessment: AXIS I:  Chronic Paranoid Schizophrenia and r/o Schizoaffective, bipolar type AXIS II:  Deferred AXIS III:   Past Medical History  Diagnosis Date  . Hypertension   . Bipolar 1 disorder   . Schizophrenic disorder    AXIS IV:  other psychosocial or environmental problems and problems with primary support group AXIS V:  31-40 impairment in reality testing  Plan:  Recommend psychiatric Inpatient admission when medically cleared.  Subjective:   Marc [name] is a 58 y.o. male patient admitted with getting paranoid and agitated. Non compliant with medications.  HPI:  JERAMIA [name] is an 58 y.o. male whose wife took out IVC paperwork on him, saying, "I just can't take it anymore--he is a danger to others". Wife has reported that pt has been agitated with erratic behavior-- flicks a lighter in her face while she is asleep and makes statements in a threatening way like, "Don't let the bed bugs bite", "It's going to be over for you and me when the new month comes in" (She does not know what these mean). She states that pt is paranoid and will not take any medication. She states that he has thrown away a cell phone and 2 TVs because he thinks that someone is listening to him through the TV. Wife states that she cannot use the phone around him because of his paranoia--he thinks she is talking about him all the time. She states that pt only sleeps 2 hrs per night and paces around, drinking coffee. She is also concerned that he is hurting and won't admit it and that he may take too much Goody's powder. She states that he went to the [place] in 2013 and has been to [facility], but will not f/u with OP treatment because he does not think anything is wrong with him.  In pt interview, he states that he does not need to  talk to a counselor, that nothing is wrong with him mentally--he is not schizophrenic, deinies HI/ SI/A/V hallucinations. He says that there is a "pump that someone put in his butt" that is bothering him constantly (review of patient's medical records indicate that the patient had a spinal epidural abscess in October 2008, which is the procedure he is likely referring to.Patient also had an ET tube placed because of this). Pt shows Clinical research associate the area and says, "Can you hear that noise? It is driving me crazy"! On evaluation today still remains guarded and paranoid. I don't want to talk or be rude. They are doing that' . I am not at fault and you should not be a part of it. He remained reluctant to talk or elaborate on his admission but was getting paranoid and agitated.  HPI Elements:   Location:  psychosis. Quality:  moderate. Severity:  recurrent.  Past Psychiatric History: Past Medical History  Diagnosis Date  . Hypertension   . Bipolar 1 disorder   . Schizophrenic disorder     reports that he has been smoking.  He does not have any smokeless tobacco history on file. He reports that he drinks alcohol. His drug history is not on file. No family history on file. Family History Substance Abuse: No Family Supports: Yes, List: (wife) Living Arrangements: Spouse/significant other Can pt return to current living arrangement?: Yes Abuse/Neglect [facility])  Physical Abuse:  (pt refused to discuss) Verbal Abuse:  (pt refused to discuss) Sexual Abuse:  (pt refused to discuss) Allergies:  No Known Allergies  ACT Assessment Complete:  Yes:    Educational Status    Risk to Self: Risk to self Suicidal Ideation: No Suicidal Intent: No Is patient at risk for suicide?: No Suicidal Plan?: No Access to Means: No What has been your use of drugs/alcohol within the last 12 months?: denies Previous Attempts/Gestures: No Other Self Harm Risks:  (psychosis) Intentional Self Injurious Behavior: None Family  Suicide History: Unknown Recent stressful life event(s):  (none known) Persecutory voices/beliefs?: Yes Depression:  (denies) Depression Symptoms: Feeling angry/irritable;Insomnia;Isolating Substance abuse history and/or treatment for substance abuse?:  (denies) Suicide prevention information given to non-admitted patients: Not applicable  Risk to Others: Risk to Others Homicidal Ideation: No Thoughts of Harm to Others: No Current Homicidal Intent: No Current Homicidal Plan: No Access to Homicidal Means: No History of harm to others?: No Assessment of Violence:  (vague threats to wife) Violent Behavior Description:  (flicking lighter in wife's face while asleep) Does patient have access to weapons?:  (household kinves, etc.) Criminal Charges Pending?: No Does patient have a court date: No  Abuse: Abuse/Neglect Assessment (Assessment to be complete while patient is alone) Physical Abuse:  (pt refused to discuss) Verbal Abuse:  (pt refused to discuss) Sexual Abuse:  (pt refused to discuss) Exploitation of patient/patient's resources:  (pt refused to discuss) Self-Neglect:  (pt refused to discuss)  Prior Inpatient Therapy: Prior Inpatient Therapy Prior Inpatient Therapy: Yes Prior Therapy Dates: 2013 [facility] Reason for Treatment: paranoia  Prior Outpatient Therapy: Prior Outpatient Therapy Prior Outpatient Therapy: No  Additional Information: Additional Information 1:1 In Past 12 Months?: No CIRT Risk: Yes Elopement Risk: Yes Does patient have medical clearance?: Yes                  Objective: Blood pressure 130/89, pulse 89, temperature 98.1 F (36.7 C), temperature source Oral, resp. rate 18, SpO2 94.00%.There is no height or weight on file to calculate BMI. Results for orders placed during the hospital encounter of 05/08/13 (from the past 72 hour(s))  CBC WITH DIFFERENTIAL     Status: Abnormal   Collection Time    05/08/13   7:26 PM      Result Value Ref Range   WBC 6.7  4.0 - 10.5 K/uL   RBC 5.88 (*) 4.22 - 5.81 MIL/uL   Hemoglobin 15.7  13.0 - 17.0 g/dL   HCT 47.0  39.0 - 52.0 %   MCV 79.9  78.0 - 100.0 fL   MCH 26.7  26.0 - 34.0 pg   MCHC 33.4  30.0 - 36.0 g/dL   RDW 14.9  11.5 - 15.5 %   Platelets 204  150 - 400 K/uL   Neutrophils Relative % 61  43 - 77 %   Neutro Abs 4.1  1.7 - 7.7 K/uL   Lymphocytes Relative 28  12 - 46 %   Lymphs Abs 1.9  0.7 - 4.0 K/uL   Monocytes Relative 10  3 - 12 %   Monocytes Absolute 0.6  0.1 - 1.0 K/uL   Eosinophils Relative 1  0 - 5 %   Eosinophils Absolute 0.1  0.0 - 0.7 K/uL   Basophils Relative 0  0 - 1 %   Basophils Absolute 0.0  0.0 - 0.1 K/uL  COMPREHENSIVE METABOLIC PANEL     Status: Abnormal  Collection Time    05/08/13  7:26 PM      Result Value Ref Range   Sodium 134 (*) 137 - 147 mEq/L   Potassium 4.2  3.7 - 5.3 mEq/L   Chloride 95 (*) 96 - 112 mEq/L   CO2 23  19 - 32 mEq/L   Glucose, Bld 278 (*) 70 - 99 mg/dL   BUN 13  6 - 23 mg/dL   Creatinine, Ser 0.74  0.50 - 1.35 mg/dL   Calcium 9.6  8.4 - 10.5 mg/dL   Total Protein 7.7  6.0 - 8.3 g/dL   Albumin 3.9  3.5 - 5.2 g/dL   AST 13  0 - 37 U/L   ALT 15  0 - 53 U/L   Alkaline Phosphatase 93  39 - 117 U/L   Total Bilirubin 0.2 (*) 0.3 - 1.2 mg/dL   GFR calc non Af Amer >90  >90 mL/min   GFR calc Af Amer >90  >90 mL/min   Comment: (NOTE)     The eGFR has been calculated using the CKD EPI equation.     This calculation has not been validated in all clinical situations.     eGFR's persistently <90 mL/min signify possible Chronic Kidney     Disease.  ETHANOL     Status: None   Collection Time    05/08/13  7:26 PM      Result Value Ref Range   Alcohol, Ethyl (B) <11  0 - 11 mg/dL   Comment:            LOWEST DETECTABLE LIMIT FOR     SERUM ALCOHOL IS 11 mg/dL     FOR MEDICAL PURPOSES ONLY  URINE RAPID DRUG SCREEN (HOSP PERFORMED)     Status: None   Collection Time    05/08/13 10:01 PM       Result Value Ref Range   Opiates NONE DETECTED  NONE DETECTED   Cocaine NONE DETECTED  NONE DETECTED   Benzodiazepines NONE DETECTED  NONE DETECTED   Amphetamines NONE DETECTED  NONE DETECTED   Tetrahydrocannabinol NONE DETECTED  NONE DETECTED   Barbiturates NONE DETECTED  NONE DETECTED   Comment:            DRUG SCREEN FOR MEDICAL PURPOSES     ONLY.  IF CONFIRMATION IS NEEDED     FOR ANY PURPOSE, NOTIFY LAB     WITHIN 5 DAYS.                LOWEST DETECTABLE LIMITS     FOR URINE DRUG SCREEN     Drug Class       Cutoff (ng/mL)     Amphetamine      1000     Barbiturate      200     Benzodiazepine   694     Tricyclics       854     Opiates          300     Cocaine          300     THC              50  URINALYSIS, ROUTINE W REFLEX MICROSCOPIC     Status: Abnormal   Collection Time    05/08/13 10:01 PM      Result Value Ref Range   Color, Urine YELLOW  YELLOW   APPearance CLEAR  CLEAR   Specific Gravity, Urine 1.035 (*)  1.005 - 1.030   pH 5.5  5.0 - 8.0   Glucose, UA >1000 (*) NEGATIVE mg/dL   Hgb urine dipstick NEGATIVE  NEGATIVE   Bilirubin Urine NEGATIVE  NEGATIVE   Ketones, ur NEGATIVE  NEGATIVE mg/dL   Protein, ur NEGATIVE  NEGATIVE mg/dL   Urobilinogen, UA 1.0  0.0 - 1.0 mg/dL   Nitrite NEGATIVE  NEGATIVE   Leukocytes, UA NEGATIVE  NEGATIVE  URINE MICROSCOPIC-ADD ON     Status: None   Collection Time    05/08/13 10:01 PM      Result Value Ref Range   Squamous Epithelial / LPF RARE  RARE   WBC, UA 0-2  <3 WBC/hpf   RBC / HPF 0-2  <3 RBC/hpf   Labs are reviewed and are pertinent for see medical notes.  Current Facility-Administered Medications  Medication Dose Route Frequency Provider Last Rate Last Dose  . acetaminophen (TYLENOL) tablet 650 mg  650 mg Oral Q4H PRN Margarita Mail, PA-C      . alum & mag hydroxide-simeth (MAALOX/MYLANTA) 200-200-20 MG/5ML suspension 30 mL  30 mL Oral PRN Margarita Mail, PA-C      . LORazepam (ATIVAN) tablet 1 mg  1 mg Oral Q8H PRN  Margarita Mail, PA-C   1 mg at 05/08/13 2033  . metFORMIN (GLUCOPHAGE) tablet 1,000 mg  1,000 mg Oral BID Margarita Mail, PA-C      . nicotine (NICODERM CQ - dosed in mg/24 hours) patch 21 mg  21 mg Transdermal Daily Abigail Harris, PA-C      . ondansetron (ZOFRAN) tablet 4 mg  4 mg Oral Q8H PRN Margarita Mail, PA-C      . zolpidem (AMBIEN) tablet 5 mg  5 mg Oral QHS PRN Margarita Mail, PA-C       No current outpatient prescriptions on file.    Psychiatric Specialty Exam:     Blood pressure 130/89, pulse 89, temperature 98.1 F (36.7 C), temperature source Oral, resp. rate 18, SpO2 94.00%.There is no height or weight on file to calculate BMI.  General Appearance: Casual  Eye Contact::  Poor  Speech:  loud  Volume:  Increased  Mood:  Irritable  Affect:  Congruent  Thought Process:  Disorganized  Orientation:  Full (Time, Place, and Person)  Thought Content:  Delusions, Paranoid Ideation and Rumination  Suicidal Thoughts:  No  Homicidal Thoughts:  No  Memory:  Recent;   Fair  Judgement:  Poor  Insight:  Lacking  Psychomotor Activity:  Decreased  Concentration:  Fair  Recall:  [place]: Fair  Akathisia:  Negative  Handed:  Right  AIMS (if indicated):     Assets:  Housing  Sleep:      Musculoskeletal: Strength & Muscle Tone: within normal limits Gait & Station: normal Patient leans: Front  Treatment Plan Summary: Daily contact with patient to assess and evaluate symptoms and progress in treatment Medication management Admit to Hospital for stabilization. Continue IVC.   Merian Capron MD 05/09/2013 10:18 AM

## 2013-05-09 NOTE — BH Assessment (Signed)
[facility] Assessment Progress Note   Nurse called report and [facility]  [facility] and he will be transported by [employer].  Nanine MeansJamison Lord, NP agrees with disposition.

## 2013-05-09 NOTE — BH Assessment (Signed)
[facility] Assessment Progress Note  [facility] by Dr. Yetta Barre[facility].  Never heard back from [facility], and [facility] had no beds.  Call report number is [phone].  Dr. Lynnae SandhoffAkthar agrees with disposition.
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
## 2013-05-15 NOTE — ED Provider Notes (Signed)
Medical screening examination/treatment/procedure(s) were performed by non-physician practitioner and as supervising physician I was immediately available for consultation/collaboration.   Jillyan Plitt T Randeep Biondolillo, MD 05/15/13 2315 

## 2017-01-05 ENCOUNTER — Encounter (HOSPITAL_COMMUNITY): Payer: Self-pay | Admitting: Nurse Practitioner

## 2017-01-05 ENCOUNTER — Emergency Department (HOSPITAL_COMMUNITY)
Admission: EM | Admit: 2017-01-05 | Discharge: 2017-01-05 | Disposition: A | Discharge status: Home / Self Care | Payer: Self-pay | Attending: Emergency Medicine | Admitting: Emergency Medicine

## 2017-01-05 ENCOUNTER — Emergency Department (HOSPITAL_COMMUNITY): Payer: Self-pay

## 2017-01-05 DIAGNOSIS — F17210 Nicotine dependence, cigarettes, uncomplicated: Secondary | ICD-10-CM | POA: Insufficient documentation

## 2017-01-05 DIAGNOSIS — I10 Essential (primary) hypertension: Secondary | ICD-10-CM | POA: Insufficient documentation

## 2017-01-05 DIAGNOSIS — M545 Low back pain, unspecified: Secondary | ICD-10-CM

## 2017-01-05 DIAGNOSIS — M25552 Pain in left hip: Secondary | ICD-10-CM | POA: Insufficient documentation

## 2017-01-05 LAB — CBC WITH DIFFERENTIAL/PLATELET
BASOS ABS: 0 10*3/uL (ref 0.0–0.1)
BASOS PCT: 0 %
EOS ABS: 0.1 10*3/uL (ref 0.0–0.7)
EOS PCT: 1 %
HCT: 45.5 % (ref 39.0–52.0)
Hemoglobin: 15.3 g/dL (ref 13.0–17.0)
Lymphocytes Relative: 30 %
Lymphs Abs: 2.4 10*3/uL (ref 0.7–4.0)
MCH: 28 pg (ref 26.0–34.0)
MCHC: 33.6 g/dL (ref 30.0–36.0)
MCV: 83.3 fL (ref 78.0–100.0)
MONO ABS: 0.7 10*3/uL (ref 0.1–1.0)
Monocytes Relative: 9 %
Neutro Abs: 4.8 10*3/uL (ref 1.7–7.7)
Neutrophils Relative %: 60 %
PLATELETS: 192 10*3/uL (ref 150–400)
RBC: 5.46 MIL/uL (ref 4.22–5.81)
RDW: 16.6 % — AB (ref 11.5–15.5)
WBC: 8 10*3/uL (ref 4.0–10.5)

## 2017-01-05 LAB — COMPREHENSIVE METABOLIC PANEL
ALBUMIN: 4.2 g/dL (ref 3.5–5.0)
ALK PHOS: 65 U/L (ref 38–126)
ALT: 26 U/L (ref 17–63)
AST: 30 U/L (ref 15–41)
Anion gap: 8 (ref 5–15)
BILIRUBIN TOTAL: 0.7 mg/dL (ref 0.3–1.2)
BUN: 18 mg/dL (ref 6–20)
CALCIUM: 8.5 mg/dL — AB (ref 8.9–10.3)
CO2: 25 mmol/L (ref 22–32)
CREATININE: 0.84 mg/dL (ref 0.61–1.24)
Chloride: 101 mmol/L (ref 101–111)
GFR calc Af Amer: >60 mL/min (ref >60)
GLUCOSE: 103 mg/dL — AB (ref 65–99)
POTASSIUM: 4.2 mmol/L (ref 3.5–5.1)
Sodium: 134 mmol/L — ABNORMAL LOW (ref 135–145)
TOTAL PROTEIN: 7 g/dL (ref 6.5–8.1)

## 2017-01-05 MED ORDER — IOPAMIDOL (ISOVUE-300) INJECTION 61%
INTRAVENOUS | Status: AC
  Administered 2017-01-05: 100 mL via INTRAVENOUS
  Filled 2017-01-05: qty 100

## 2017-01-05 MED ORDER — MORPHINE SULFATE (PF) 4 MG/ML IV SOLN
4.0000 mg | Freq: Once | INTRAVENOUS | Status: AC
  Administered 2017-01-05: 4 mg via INTRAVENOUS
  Filled 2017-01-05: qty 1

## 2017-01-05 MED ORDER — TRAMADOL HCL 50 MG PO TABS: 50.0000 mg | ORAL_TABLET | Freq: Four times a day (QID) | ORAL | 0 refills | Status: DC | PRN

## 2017-01-05 MED ORDER — NAPROXEN 375 MG PO TABS: 375.0000 mg | ORAL_TABLET | Freq: Two times a day (BID) | ORAL | 0 refills | Status: DC

## 2017-01-05 NOTE — ED Notes (Signed)
Orders were acknowledged prior to speaking with Lynelle DoctorKnapp, MD. MD stated he will update orders shortly.

## 2017-01-05 NOTE — ED Notes (Signed)
Pt states that his hip was still hurting and that he is not ready to go home.  States he does not have anybody at home to help him.  He got OOB to show this nurse that he his having difficulty ambulating.  Pt would arch his back and stiffen his legs to show that he is having difficulty ambulating.  Pt made aware that this nurse witnessed him ambulate to the BR without difficulty and with steady gait.  Then pt states he is not able to feed himself if he go home, pt made aware that he is talking just fine and that he can eat, he states he can only eat soft food and is not able to cook and prepare his food at home.  Explained to pt that there are criteria that he needs to meet for admission.  That his condition is not emergent and does not warrant admission.   Pt finally verbalized understanding.  Cab was contacted for pt per his request.

## 2017-01-05 NOTE — ED Triage Notes (Signed)
Pt is c/o left hip pain which on quick inspection a almost shortening of the left leg with some deformity is noticeable. Pt walked into the triage room and is able to bare weight, denies trauma including falls, but reports a 'grinding sensation to his left gluteal. He further denies any MSK/orthopedic hx.
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
## 2017-01-05 NOTE — Discharge Instructions (Signed)
Take the medications as prescribed.  Follow-up with your primary care doctor.  Also contact the urology office to follow-up on the bladder abnormality noted on the CAT scan

## 2017-01-10 LAB — CULTURE, BLOOD (ROUTINE X 2)
Culture: NO GROWTH
SPECIAL REQUESTS: ADEQUATE

## 2017-10-10 ENCOUNTER — Other Ambulatory Visit: Payer: Self-pay

## 2017-10-10 ENCOUNTER — Emergency Department (HOSPITAL_COMMUNITY)
Admission: EM | Admit: 2017-10-10 | Discharge: 2017-10-10 | Disposition: A | Discharge status: Home / Self Care | Payer: Self-pay | Attending: Emergency Medicine | Admitting: Emergency Medicine

## 2017-10-10 ENCOUNTER — Emergency Department (HOSPITAL_COMMUNITY): Payer: Self-pay

## 2017-10-10 ENCOUNTER — Encounter (HOSPITAL_COMMUNITY): Payer: Self-pay | Admitting: Emergency Medicine

## 2017-10-10 DIAGNOSIS — E119 Type 2 diabetes mellitus without complications: Secondary | ICD-10-CM | POA: Insufficient documentation

## 2017-10-10 DIAGNOSIS — J441 Chronic obstructive pulmonary disease with (acute) exacerbation: Secondary | ICD-10-CM | POA: Insufficient documentation

## 2017-10-10 DIAGNOSIS — I10 Essential (primary) hypertension: Secondary | ICD-10-CM | POA: Insufficient documentation

## 2017-10-10 DIAGNOSIS — F17210 Nicotine dependence, cigarettes, uncomplicated: Secondary | ICD-10-CM | POA: Insufficient documentation

## 2017-10-10 LAB — BASIC METABOLIC PANEL
ANION GAP: 10 (ref 5–15)
BUN: 17 mg/dL (ref 8–23)
CHLORIDE: 104 mmol/L (ref 98–111)
CO2: 26 mmol/L (ref 22–32)
Calcium: 8.6 mg/dL — ABNORMAL LOW (ref 8.9–10.3)
Creatinine, Ser: 0.89 mg/dL (ref 0.61–1.24)
GFR calc Af Amer: >60 mL/min (ref >60)
GFR calc non Af Amer: >60 mL/min (ref >60)
Glucose, Bld: 147 mg/dL — ABNORMAL HIGH (ref 70–99)
POTASSIUM: 3.6 mmol/L (ref 3.5–5.1)
Sodium: 140 mmol/L (ref 135–145)

## 2017-10-10 LAB — BRAIN NATRIURETIC PEPTIDE: B NATRIURETIC PEPTIDE 5: 12.6 pg/mL (ref 0.0–100.0)

## 2017-10-10 LAB — I-STAT TROPONIN, ED: Troponin i, poc: 0.02 ng/mL (ref 0.00–0.08)

## 2017-10-10 LAB — CBC WITH DIFFERENTIAL/PLATELET
BASOS ABS: 0 10*3/uL (ref 0.0–0.1)
Basophils Relative: 0 %
EOS ABS: 0.1 10*3/uL (ref 0.0–0.7)
EOS PCT: 2 %
HCT: 48.7 % (ref 39.0–52.0)
HEMOGLOBIN: 15.4 g/dL (ref 13.0–17.0)
Lymphocytes Relative: 37 %
Lymphs Abs: 2.5 10*3/uL (ref 0.7–4.0)
MCH: 26.7 pg (ref 26.0–34.0)
MCHC: 31.6 g/dL (ref 30.0–36.0)
MCV: 84.4 fL (ref 78.0–100.0)
MONO ABS: 0.5 10*3/uL (ref 0.1–1.0)
Monocytes Relative: 7 %
Neutro Abs: 3.6 10*3/uL (ref 1.7–7.7)
Neutrophils Relative %: 54 %
Platelets: 190 10*3/uL (ref 150–400)
RBC: 5.77 MIL/uL (ref 4.22–5.81)
RDW: 15.5 % (ref 11.5–15.5)
WBC: 6.8 10*3/uL (ref 4.0–10.5)

## 2017-10-10 MED ORDER — DEXAMETHASONE SODIUM PHOSPHATE 10 MG/ML IJ SOLN
10.0000 mg | Freq: Once | INTRAMUSCULAR | Status: AC
  Administered 2017-10-10: 10 mg via INTRAVENOUS
  Filled 2017-10-10: qty 1

## 2017-10-10 MED ORDER — PREDNISONE 50 MG PO TABS: 50.0000 mg | ORAL_TABLET | Freq: Every day | ORAL | 0 refills | Status: DC

## 2017-10-10 MED ORDER — ALBUTEROL SULFATE HFA 108 (90 BASE) MCG/ACT IN AERS
1.0000 | INHALATION_SPRAY | Freq: Four times a day (QID) | RESPIRATORY_TRACT | Status: DC | PRN
  Administered 2017-10-10: 2 via RESPIRATORY_TRACT
  Filled 2017-10-10: qty 6.7

## 2017-10-10 MED ORDER — IPRATROPIUM-ALBUTEROL 0.5-2.5 (3) MG/3ML IN SOLN
3.0000 mL | Freq: Once | RESPIRATORY_TRACT | Status: AC
  Administered 2017-10-10: 3 mL via RESPIRATORY_TRACT
  Filled 2017-10-10: qty 3

## 2017-10-10 MED ORDER — ALBUTEROL SULFATE (2.5 MG/3ML) 0.083% IN NEBU
5.0000 mg | INHALATION_SOLUTION | Freq: Once | RESPIRATORY_TRACT | Status: AC
  Administered 2017-10-10: 5 mg via RESPIRATORY_TRACT
  Filled 2017-10-10: qty 6

## 2017-10-10 NOTE — Discharge Instructions (Addendum)
Use the inhaler to help with your shortness of breath, take the steroid prescriptions as prescribed, follow-up with a primary care doctor to be rechecked in the next week

## 2017-10-10 NOTE — ED Provider Notes (Signed)
[facility]-EMERGENCY DEPT Provider Note   CSN: [id number] Arrival date & time: 10/10/17  0450     History   Chief Complaint Chief Complaint  Patient presents with  . Shortness of Breath    HPI Marc [name] is a 62 y.o. male.  HPI  This is a 62 year old male with a history of diabetes, hypertension, bipolar disorder, schizophrenia who presents with shortness of breath.  Patient is a current everyday smoker.  History of significant smoking.  Patient reports that approximately 3 AM he went out to smoke a cigarette.  He came back inside and had increasing shortness of breath.  Denies any recent coughs or fevers.  Denies chest pain.  Denies any lower extremity swelling or history of heart failure.  States over the last year he has slept more upright in the bed because of some shortness of breath.  No formal diagnosis of COPD or asthma.  States he feels somewhat better after DuoNeb by EMS.  Past Medical History:  Diagnosis Date  . Bipolar 1 disorder (HCC)   . Diabetes mellitus without complication (HCC)   . Hypertension   . Schizophrenic disorder [facility])     Patient Active Problem List   Diagnosis Date Noted  . EPIDURAL ABSCESS 01/01/2007  . DM 12/17/2006  . DYSPHAGIA UNSPECIFIED 12/17/2006  . HYPERTENSION NEC 12/17/2006    History reviewed. No pertinent surgical history.      Home Medications    Prior to Admission medications   Medication Sig Start Date End Date Taking? Authorizing Provider  naproxen (NAPROSYN) 375 MG tablet Take 1 tablet (375 mg total) by mouth 2 (two) times daily. Patient not taking: Reported on 10/10/2017 01/05/17   Linwood Dibbles, MD  traMADol (ULTRAM) 50 MG tablet Take 1 tablet (50 mg total) by mouth every 6 (six) hours as needed. Patient not taking: Reported on 10/10/2017 01/05/17   Linwood Dibbles, MD    Family History History reviewed. No pertinent family history.  Social History Social History   Tobacco Use  . Smoking  status: Current Every Day Smoker    Packs/day: 2.00  . Smokeless tobacco: Never Used  Substance Use Topics  . Alcohol use: Yes    Comment: very little  . Drug use: Never     Allergies   Patient has no known allergies.   Review of Systems Review of Systems  Constitutional: Negative for fever.  Respiratory: Positive for shortness of breath and wheezing. Negative for cough.   Cardiovascular: Negative for chest pain, palpitations and leg swelling.  Gastrointestinal: Negative for abdominal pain and nausea.  Genitourinary: Negative for dysuria.  All other systems reviewed and are negative.    Physical Exam Updated Vital Signs BP (!) 146/94   Pulse 92   Temp 98.1 F (36.7 C) (Oral)   Resp 20   Ht 1.803 m (5\' 11" )   SpO2 98%   Physical Exam  Constitutional: He is oriented to person, place, and time. He appears well-developed and well-nourished.  Overweight, nontoxic-appearing  HENT:  Head: Normocephalic and atraumatic.  Neck: Neck supple.  Cardiovascular: Normal rate, regular rhythm and normal heart sounds.  No murmur heard. Pulmonary/Chest: Effort normal. No accessory muscle usage. No respiratory distress. He has decreased breath sounds. He has wheezes.  Speaks in full sentences, diminished breath sounds in all lung fields, wheezing noted mostly in bilateral upper lung fields  Abdominal: Soft. Bowel sounds are normal. There is no tenderness. There is no rebound.  Musculoskeletal: He exhibits  no edema.       Right lower leg: He exhibits no tenderness and no edema.       Left lower leg: He exhibits no tenderness and no edema.  Lymphadenopathy:    He has no cervical adenopathy.  Neurological: He is alert and oriented to person, place, and time.  Skin: Skin is warm and dry.  Psychiatric: He has a normal mood and affect.  Nursing note and vitals reviewed.    ED Treatments / Results  Labs (all labs ordered are listed, but only abnormal results are displayed) Labs  Reviewed  BASIC METABOLIC PANEL - Abnormal; Notable for the following components:      Result Value   Glucose, Bld 147 (*)    Calcium 8.6 (*)    All other components within normal limits  CBC WITH DIFFERENTIAL/PLATELET  BRAIN NATRIURETIC PEPTIDE  I-STAT TROPONIN, ED    EKG EKG Interpretation  Date/Time:  Wednesday October 10 2017 05:28:04 EDT Ventricular Rate:  94 PR Interval:    QRS Duration: 108 QT Interval:  365 QTC Calculation: 457 R Axis:   49 Text Interpretation:  Sinus rhythm Anteroseptal infarct, age indeterminate Confirmed by Ross Marcus ([id number]) on 10/10/2017 5:48:35 AM   Radiology Dg Chest Portable 1 View  Result Date: 10/10/2017 CLINICAL DATA:  62 y/o  M; new onset shortness of breath. EXAM: PORTABLE CHEST 1 VIEW COMPARISON:  10/20/2006 chest radiograph. FINDINGS: Stable normal cardiac silhouette given projection and technique. Aortic atherosclerosis with calcification. Clear lungs. No pleural effusion or pneumothorax. No acute osseous abnormality is evident. IMPRESSION: No active disease. Electronically Signed   By: Mitzi Hansen M.D.   On: 10/10/2017 06:14    Procedures Procedures (including critical care time)  Medications Ordered in ED Medications  ipratropium-albuterol (DUONEB) 0.5-2.5 (3) MG/3ML nebulizer solution 3 mL (3 mLs Nebulization Given 10/10/17 0545)  dexamethasone (DECADRON) injection 10 mg (10 mg Intravenous Given 10/10/17 0545)  ipratropium-albuterol (DUONEB) 0.5-2.5 (3) MG/3ML nebulizer solution 3 mL (3 mLs Nebulization Given 10/10/17 0727)     Initial Impression / Assessment and Plan / ED Course  I have reviewed the triage vital signs and the nursing notes.  Pertinent labs & imaging results that were available during my care of the patient were reviewed by me and considered in my medical decision making (see chart for details).  Clinical Course as of Oct 10 741  Wed Oct 10, 2017  6045 Patient ambulated to restroom without any  respiratory difficulty.  I requested pulse ox.  Ambulation with pulse ox 89 to 90%.  On repeat pulmonary exam patient continues to have some diminished breath sounds but is overall improved.  He has some mild wheezing.  Will order additional DuoNeb and reassess.  Suspect patient has underlying COPD that has not been diagnosed.   [CH]    Clinical Course User Index [CH] Horton, Mayer Masker, MD    Patient with shortness of breath.  Wheezing on exam.  Extensive smoking history.  No formal diagnosis of COPD but would suspect underlying disease.  Patient was given a DuoNeb and Decadron.  Chest x-ray shows no evidence of pneumothorax or pneumonia.  Troponin is negative.  Doubt ACS.  BNP is normal.  See recheck above.  Patient clinically improving but repeat DuoNeb was ordered.  Signed out pending recheck.  Will discharge home with inhaler and doxycycline for likely COPD exacerbation if patient continues to clinically improve.  Final Clinical Impressions(s) / ED Diagnoses   Final diagnoses:  None  ED Discharge Orders    None       Horton, Mayer Masker, MD 10/10/17 [phone]

## 2017-10-10 NOTE — ED Notes (Signed)
Bed: ON62 Expected date:  Expected time:  Means of arrival:  Comments: Respiratory distress

## 2017-10-10 NOTE — ED Provider Notes (Signed)
Pt was seen by Dr Wilkie Aye.  Please see her note.  Pt is feeling better after treatments.  Still with some wheezing on my exam.   Bedside o2 sat on room air in the mid 90s.  Will try one more treatment.  Pt requested something to eat.  Will plan on dc with steroids and inhalers.   Linwood Dibbles, MD 10/10/17 [phone]

## 2017-10-10 NOTE — ED Notes (Signed)
Ambulating pulse Ox 89% to 90% on room air HR 113 EDP aware

## 2017-10-10 NOTE — ED Triage Notes (Signed)
Pt from home called EMS d/t SOB on arrival EMS assess pt and decreased bilat lung fields. Albuteral 5mg  aerosol begun pt states breathing improving.

## 2017-10-16 ENCOUNTER — Encounter (HOSPITAL_COMMUNITY): Payer: Self-pay | Admitting: Emergency Medicine

## 2017-10-16 ENCOUNTER — Emergency Department (HOSPITAL_COMMUNITY)
Admission: EM | Admit: 2017-10-16 | Discharge: 2017-10-17 | Disposition: A | Discharge status: Other Acute Inpatient Hospital | Payer: Federal, State, Local not specified - Other | Attending: Emergency Medicine | Admitting: Emergency Medicine

## 2017-10-16 ENCOUNTER — Other Ambulatory Visit: Payer: Self-pay

## 2017-10-16 DIAGNOSIS — R39198 Other difficulties with micturition: Secondary | ICD-10-CM

## 2017-10-16 DIAGNOSIS — F22 Delusional disorders: Secondary | ICD-10-CM

## 2017-10-16 DIAGNOSIS — F209 Schizophrenia, unspecified: Secondary | ICD-10-CM | POA: Diagnosis present

## 2017-10-16 DIAGNOSIS — E119 Type 2 diabetes mellitus without complications: Secondary | ICD-10-CM | POA: Insufficient documentation

## 2017-10-16 DIAGNOSIS — F17210 Nicotine dependence, cigarettes, uncomplicated: Secondary | ICD-10-CM | POA: Insufficient documentation

## 2017-10-16 DIAGNOSIS — R3911 Hesitancy of micturition: Secondary | ICD-10-CM | POA: Insufficient documentation

## 2017-10-16 LAB — COMPREHENSIVE METABOLIC PANEL
ALT: 32 U/L (ref 0–44)
AST: 19 U/L (ref 15–41)
Albumin: 4.1 g/dL (ref 3.5–5.0)
Alkaline Phosphatase: 63 U/L (ref 38–126)
Anion gap: 10 (ref 5–15)
BUN: 14 mg/dL (ref 8–23)
CHLORIDE: 103 mmol/L (ref 98–111)
CO2: 29 mmol/L (ref 22–32)
CREATININE: 0.9 mg/dL (ref 0.61–1.24)
Calcium: 9.2 mg/dL (ref 8.9–10.3)
GFR calc Af Amer: >60 mL/min (ref >60)
GFR calc non Af Amer: >60 mL/min (ref >60)
Glucose, Bld: 155 mg/dL — ABNORMAL HIGH (ref 70–99)
Potassium: 4.3 mmol/L (ref 3.5–5.1)
Sodium: 142 mmol/L (ref 135–145)
Total Bilirubin: 0.5 mg/dL (ref 0.3–1.2)
Total Protein: 6.8 g/dL (ref 6.5–8.1)

## 2017-10-16 LAB — URINALYSIS, ROUTINE W REFLEX MICROSCOPIC
Bilirubin Urine: NEGATIVE
Glucose, UA: NEGATIVE mg/dL
Ketones, ur: NEGATIVE mg/dL
NITRITE: NEGATIVE
PROTEIN: NEGATIVE mg/dL
Specific Gravity, Urine: 1.013 (ref 1.005–1.030)
pH: 6 (ref 5.0–8.0)

## 2017-10-16 LAB — CBC WITH DIFFERENTIAL/PLATELET
Abs Immature Granulocytes: 0.03 10*3/uL (ref 0.00–0.07)
BASOS ABS: 0 10*3/uL (ref 0.0–0.1)
Basophils Relative: 0 %
EOS PCT: 1 %
Eosinophils Absolute: 0.1 10*3/uL (ref 0.0–0.5)
HEMATOCRIT: 49.6 % (ref 39.0–52.0)
HEMOGLOBIN: 15.2 g/dL (ref 13.0–17.0)
Immature Granulocytes: 0 %
LYMPHS ABS: 2.1 10*3/uL (ref 0.7–4.0)
LYMPHS PCT: 26 %
MCH: 26.4 pg (ref 26.0–34.0)
MCHC: 30.6 g/dL (ref 30.0–36.0)
MCV: 86.1 fL (ref 80.0–100.0)
MONO ABS: 0.5 10*3/uL (ref 0.1–1.0)
MONOS PCT: 6 %
Neutro Abs: 5.4 10*3/uL (ref 1.7–7.7)
Neutrophils Relative %: 67 %
Platelets: 146 10*3/uL — ABNORMAL LOW (ref 150–400)
RBC: 5.76 MIL/uL (ref 4.22–5.81)
RDW: 15.4 % (ref 11.5–15.5)
WBC: 8.2 10*3/uL (ref 4.0–10.5)
nRBC: 0 % (ref 0.0–0.2)

## 2017-10-16 LAB — RAPID URINE DRUG SCREEN, HOSP PERFORMED
Amphetamines: NOT DETECTED
Barbiturates: NOT DETECTED
Benzodiazepines: NOT DETECTED
Cocaine: NOT DETECTED
Opiates: NOT DETECTED
Tetrahydrocannabinol: NOT DETECTED

## 2017-10-16 LAB — ETHANOL: Alcohol, Ethyl (B): <10 mg/dL (ref <10)

## 2017-10-16 MED ORDER — ZIPRASIDONE MESYLATE 20 MG IM SOLR: 20.0000 mg | INTRAMUSCULAR | Status: DC | PRN

## 2017-10-16 MED ORDER — ACETAMINOPHEN 325 MG PO TABS: 650.0000 mg | ORAL_TABLET | ORAL | Status: DC | PRN

## 2017-10-16 MED ORDER — LORAZEPAM 1 MG PO TABS: 1.0000 mg | ORAL_TABLET | ORAL | Status: DC | PRN

## 2017-10-16 MED ORDER — RISPERIDONE 1 MG PO TBDP: 2.0000 mg | ORAL_TABLET | Freq: Three times a day (TID) | ORAL | Status: DC | PRN

## 2017-10-16 NOTE — Progress Notes (Signed)
10/16/17  1450  Labs drawn on patient. Gold, purple, light green, light blue, and dark green sent to lab.

## 2017-10-16 NOTE — ED Notes (Signed)
Bed: WA30 Expected date:  Expected time:  Means of arrival:  Comments: 

## 2017-10-16 NOTE — BH Assessment (Signed)
Assessment Note  Marc [name] is an 62 y.o. male presenting voluntarily to [facility] ED with [employer]. Patient reported that he called the police because "I got problems. 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 They keep putting pressure on my back and irritating me." Patient denies SI/HI/AVH and any history of mental illness. Patient denies any substance use or criminal charges.  Patient is a poor historian and so his (seperated) wife, Marc [name] was contacted for collateral information to complete assessment.  Collateral: Patient came to [facility] ED in 2015 due to psychosis. He was experiencing delusions about a trucking company and was paranoid they were "after him." She stated that he was referred to [facility] where he stayed for 1 month and was put on medications. She stated that he lived in a group home from 2015-2017 but is now living on his own. She reports that he has been off his psychiatric medications for 1 year and is decompensating. She reported that prior to calling the police, he contacted her accusing her of stealing. She stated that he "sounded paranoid and delusional." Patient had been renting a condo that was recently sold and wife believe's the stress of finding somewhere to live has triggered psychosis. Patient's wife feels that he may need to be put under IVC.  Patient's appearance is bizarre and is pacing the room during assessment. His thought process is tangential. Patient presents with delusional thinking about a trucking company he once worked for that they are hurting his back. Patient is paranoid about hospital staff and his son lying to him and "trying to get him to say certain things." Patient's mood is irritable and his affect is anxious. He has poor insight into his mental illness and denies any history of hospitalizations.  Per Malachy Chamber, NP patient meets inpatient criteria. TTS to search for placement.  Diagnosis: F20.9 Schizophrenia  Past Medical History:   Past Medical History:  Diagnosis Date  . Bipolar 1 disorder (HCC)   . Diabetes mellitus without complication (HCC)   . Hypertension   . Schizophrenic disorder (HCC)     History reviewed. No pertinent surgical history.  Family History: No family history on file.  Social History:  reports that he has been smoking. He has been smoking about 2.00 packs per day. He has never used smokeless tobacco. He reports that he drinks alcohol. He reports that he does not use drugs.  Additional Social History:  Alcohol / Drug Use Pain Medications: see MAR Prescriptions: see MAR Over the Counter: see MAR History of alcohol / drug use?: No history of alcohol / drug abuse Longest period of sobriety (when/how long): stopped using drugs 40 years ago  CIWA: CIWA-Ar BP: 126/89 Pulse Rate: 69 COWS:    Allergies: No Known Allergies  Home Medications:  (Not in a hospital admission)  OB/GYN Status:  No LMP for male patient.  General Assessment Data [facility] of [facility]: [facility] TTS Assessment: In system Is this a Tele or Face-to-Face Assessment?: Face-to-Face Is this an Initial Assessment or a Re-assessment for this encounter?: Initial Assessment Patient Accompanied by:: Other(self) Language Other than English: No Living Arrangements: (in apt but has to move out, needs housing assistance) What gender do you identify as?: Male Marital status: Separated Maiden name: none Pregnancy Status: No Living Arrangements: Alone Can pt return to current living arrangement?: No(condo is being sold, can temp live with son) Admission Status: Voluntary Is patient capable of signing voluntary admission?: Yes  Referral Source: Self/Family/Friend Insurance type: none     Crisis Care Plan Living Arrangements: Alone     Risk to self with the past 6 months Suicidal Ideation: No Has patient been a risk to self within the past 6 months prior to admission? : No Suicidal Intent: No Has patient had any  suicidal intent within the past 6 months prior to admission? : No Is patient at risk for suicide?: No Suicidal Plan?: No Has patient had any suicidal plan within the past 6 months prior to admission? : No Access to Means: No What has been your use of drugs/alcohol within the last 12 months?: none Previous Attempts/Gestures: No How many times?: 0 Other Self Harm Risks: none Triggers for Past Attempts: (n/a) Intentional Self Injurious Behavior: None Family Suicide History: No Recent stressful life event(s): (none reported) Persecutory voices/beliefs?: No Depression: No Depression Symptoms: (none) Substance abuse history and/or treatment for substance abuse?: No Suicide prevention information given to non-admitted patients: Not applicable  Risk to Others within the past 6 months Homicidal Ideation: No Does patient have any lifetime risk of violence toward others beyond the six months prior to admission? : No Thoughts of Harm to Others: No Current Homicidal Intent: No Current Homicidal Plan: No Access to Homicidal Means: No Identified Victim: none History of harm to others?: No Assessment of Violence: None Noted Violent Behavior Description: none Does patient have access to weapons?: No Criminal Charges Pending?: No Does patient have a court date: No Is patient on probation?: No  Psychosis Hallucinations: None noted Delusions: Persecutory, Somatic, Unspecified  Mental Status Report Appearance/Hygiene: Bizarre Eye Contact: Good Motor Activity: Shuffling Speech: Tangential, Pressured Level of Consciousness: Alert Mood: Irritable Affect: Irritable, Anxious Anxiety Level: Moderate Thought Processes: Tangential Judgement: Impaired Orientation: Person, Place, Time, Situation Obsessive Compulsive Thoughts/Behaviors: None  Cognitive Functioning Concentration: Decreased Memory: Recent Intact Is patient IDD: No Insight: Poor Impulse Control: Poor Appetite: Fair Have you  had any weight changes? : No Change Sleep: No Change Total Hours of Sleep: 8 Vegetative Symptoms: None  ADLScreening [facility] Assessment Services) Patient's cognitive ability adequate to safely complete daily activities?: Yes Patient able to express need for assistance with ADLs?: Yes Independently performs ADLs?: Yes (appropriate for developmental age)  Prior Inpatient Therapy Prior Inpatient Therapy: Yes Prior Therapy Dates: 2015 Prior Therapy Facilty/Provider(s): [facility] Reason for Treatment: schizophrenia  Prior Outpatient Therapy Prior Outpatient Therapy: No Does patient have an ACCT team?: No Does patient have Intensive In-House Services?  : No Does patient have [facility] services? : No Does patient have P4CC services?: No  ADL Screening (condition at time of admission) Patient's cognitive ability adequate to safely complete daily activities?: Yes Is the patient deaf or have difficulty hearing?: No Does the patient have difficulty seeing, even when wearing glasses/contacts?: No Does the patient have difficulty concentrating, remembering, or making decisions?: No Patient able to express need for assistance with ADLs?: Yes Does the patient have difficulty dressing or bathing?: No Independently performs ADLs?: Yes (appropriate for developmental age) Does the patient have difficulty walking or climbing stairs?: No Weakness of Legs: None Weakness of Arms/Hands: None  Home Assistive Devices/Equipment Home Assistive Devices/Equipment: None  Therapy Consults (therapy consults require a physician order) PT Evaluation Needed: No OT Evalulation Needed: No SLP Evaluation Needed: No Abuse/Neglect Assessment (Assessment to be complete while patient is alone) Abuse/Neglect Assessment Can Be Completed: Unable to assess, patient is non-responsive or altered mental status Values / Beliefs Cultural Requests During Hospitalization: None Spiritual Requests During  Hospitalization:  None Consults Spiritual Care Consult Needed: No Social Work Consult Needed: No Merchant navy officer (For Healthcare) Does Patient Have a Medical Advance Directive?: No Would patient like information on creating a medical advance directive?: No - Patient declined          Disposition: Per Malachy Chamber, NP patient meets inpatient criteria. TTS to search for placement.  Disposition Initial Assessment Completed for this Encounter: Yes Patient referred to: Other (Comment)([facility])  On Site Evaluation by:   Reviewed with Physician:    Celedonio Miyamoto 10/16/2017 12:44 PM

## 2017-10-16 NOTE — ED Notes (Signed)
Bed: WBH43 Expected date:  Expected time:  Means of arrival:  Comments: Hold for #30 

## 2017-10-16 NOTE — ED Notes (Signed)
Pt appears to be disoriented and delusion and is obsessed with the belief that he cannot urinate. According to previous nurse, his UOP was 300 ml in the urinal. Pt wants a urinal because it is easier for him to use. He also claims that he has not had a BM recently. He said that he has not slept in a while and did get in bed soon after arrival to the Acute Unit. He asked that this writer cover him and to please check on him occasionally. This Clinical research associate covered him and reassured him that we make frequent checks.

## 2017-10-16 NOTE — ED Provider Notes (Addendum)
[facility]-EMERGENCY DEPT Provider Note   CSN: [id number] Arrival date & time: 10/16/17  1022     History   Chief Complaint Chief Complaint  Patient presents with  . Back Pain  . Dysuria  . Constipation    HPI Marc [name] is a 62 y.o. male.  HPI  62 year old male comes in with chief complaint of back pain, urinary discomfort and constipation. Patient has history of diabetes, bipolar disorder and schizophrenia.  He reports that he recently became homeless.  He states that he feels like he has a rubber in his buttock, and feels like it is dragging him down.  He also reports that he is having difficulty voiding completely.  Patient is having no pain with urination.  He denies any history of prostate problems.  I called patent's wife (separated) Ms. Graydon Fofana at [phone]. She reported although they are separated, they do stay in touch.  She states that the patient called her prior to calling police.  He started accusing her of stealing.  He sounded paranoid and delusional to her.  She thinks that the patient does not take his medications, and that in the past when he had psychiatry breakdown he behaved similarly.  Patient has had admission at the [facility] for similar complaints.   Past Medical History:  Diagnosis Date  . Bipolar 1 disorder (HCC)   . Diabetes mellitus without complication (HCC)   . Hypertension   . Schizophrenic disorder [facility])     Patient Active Problem List   Diagnosis Date Noted  . EPIDURAL ABSCESS 01/01/2007  . DM 12/17/2006  . DYSPHAGIA UNSPECIFIED 12/17/2006  . HYPERTENSION NEC 12/17/2006    History reviewed. No pertinent surgical history.      Home Medications    Prior to Admission medications   Medication Sig Start Date End Date Taking? Authorizing Provider  naproxen (NAPROSYN) 375 MG tablet Take 1 tablet (375 mg total) by mouth 2 (two) times daily. Patient not taking: Reported on 10/10/2017 01/05/17   Linwood Dibbles, MD  predniSONE (DELTASONE) 50 MG tablet Take 1 tablet (50 mg total) by mouth daily. Patient not taking: Reported on 10/16/2017 10/10/17   Linwood Dibbles, MD  traMADol (ULTRAM) 50 MG tablet Take 1 tablet (50 mg total) by mouth every 6 (six) hours as needed. Patient not taking: Reported on 10/16/2017 01/05/17   Linwood Dibbles, MD    Family History No family history on file.  Social History Social History   Tobacco Use  . Smoking status: Current Every Day Smoker    Packs/day: 2.00  . Smokeless tobacco: Never Used  Substance Use Topics  . Alcohol use: Yes    Comment: very little  . Drug use: Never     Allergies   Patient has no known allergies.   Review of Systems Review of Systems  Unable to perform ROS: Psychiatric disorder  Constitutional: Positive for activity change.  Gastrointestinal: Negative for abdominal distention.  Genitourinary: Positive for decreased urine volume.  Psychiatric/Behavioral: Positive for behavioral problems. The patient is nervous/anxious.      Physical Exam Updated Vital Signs BP 126/89   Pulse 69   Temp 98.2 F (36.8 C)   Resp 16   SpO2 95%   Physical Exam  Constitutional: He is oriented to person, place, and time. He appears well-developed.  HENT:  Head: Atraumatic.  Neck: Neck supple.  Cardiovascular: Normal rate.  Pulmonary/Chest: Effort normal.  Neurological: He is alert and oriented to person, place,  and time.  Skin: Skin is warm.  Psychiatric:  Restless appearing, agitated. Delusional thoughts.  Nursing note and vitals reviewed.    ED Treatments / Results  Labs (all labs ordered are listed, but only abnormal results are displayed) Labs Reviewed  CBC WITH DIFFERENTIAL/PLATELET - Abnormal; Notable for the following components:      Result Value   Platelets 146 (*)    All other components within normal limits  URINALYSIS, ROUTINE W REFLEX MICROSCOPIC - Abnormal; Notable for the following components:   Hgb urine dipstick  SMALL (*)    Leukocytes, UA TRACE (*)    Bacteria, UA RARE (*)    All other components within normal limits  COMPREHENSIVE METABOLIC PANEL    EKG None  Radiology No results found.  Procedures Procedures (including critical care time)  Medications Ordered in ED Medications  acetaminophen (TYLENOL) tablet 650 mg (has no administration in time range)  risperiDONE (RISPERDAL M-TABS) disintegrating tablet 2 mg (has no administration in time range)    And  LORazepam (ATIVAN) tablet 1 mg (has no administration in time range)    And  ziprasidone (GEODON) injection 20 mg (has no administration in time range)     Initial Impression / Assessment and Plan / ED Course  I have reviewed the triage vital signs and the nursing notes.  Pertinent labs & imaging results that were available during my care of the patient were reviewed by me and considered in my medical decision making (see chart for details).     62 year old male comes in with chief complaint of " rubbery feeling in his buttock" along with difficulty in urination and constant throbbing pain.  His prostate exam is not revealing any significant abnormality.  Patient sounds delusional, I spoke with patient's wife at the number mentioned in the HPI and it appears that patient has decompensated psychiatrically.  He is medically cleared for psych eval at this time.  1:15 PM With time patient sounds more paranoid and delusional.  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  He also thinks that his wife and kids are trying to get him out of their house because he has million dollars in his bank acct.  Final Clinical Impressions(s) / ED Diagnoses   Final diagnoses:  Difficulty urinating  Delusional thoughts [facility])    ED Discharge Orders    None         Derwood Kaplan, MD 10/16/17 1315    Derwood Kaplan, MD 10/20/17 1625

## 2017-10-16 NOTE — Discharge Planning (Signed)
[facility] consulted regarding homeless pt needing follow-up and medication assistance.  EDCM spoke with [facility] to advise pt to utilize the [facility] for medical needs.  EDCM reached out to Lavinia Sharps, NP at [facility] to anticipate pt visit today.

## 2017-10-16 NOTE — BH Assessment (Signed)
[facility] Assessment Progress Note  Per Juanetta Beets, DO, this pt requires psychiatric hospitalization at this time.  Dr Sharma Covert also finds that pt meets criteria for IVC, which she has initiated.  IVC documents have been faxed to [facility], and at 13:48 Hart Carwin confirms receipt.  She has since faxed Findings and Custody Order to this Clinical research associate.  At 14:00 I called [employer] and spoke to [employer], who took demographic information, agreeing to dispatch law enforcement to fill out Return of Service.  Law enforcement then presented at [facility], completing Return of Service, after which IVC documents were faxed to [facility].  Per Malva Limes, RN, [facility], [facility] does not have any beds suitable for this pt's needs at this time.  Pt was then referred to [facility] after they confirmed bed availability.  They report, however, that pt has a history of treatment through the [facility], and that they would only be able to accept pt if the [place] system does not have any beds available at this time.  I then called the [facility] and spoke to Bibo [phone]).  She reports that they have beds at this time, and agrees to fax their referral forms to me.  Forms were received by this writer at 16:15.  Will pursue placement further tomorrow.    Doylene Canning Behavioral Health Coordinator [phone]

## 2017-10-16 NOTE — Progress Notes (Addendum)
CSW aware of consult. CSW aware patient is experiencing delusions. CSW informed patient is being recommended for inpatient psychiatric treatment. No further CSW needs at this time, please reconsult if needed.  Archie Balboa, LCSWA  Clinical Social Work Department  [employer]  [phone]

## 2017-10-16 NOTE — ED Triage Notes (Signed)
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

## 2017-10-16 NOTE — ED Notes (Signed)
Bed: [id number] Expected date:  Expected time:  Means of arrival:  Comments: 

## 2017-10-16 NOTE — ED Notes (Signed)
Pt alert and oriented. Pt denies any pain or discomfort at this time. Pt denies any si or hi. Pt resting quietly in bed. Will continue to monitor.

## 2017-10-17 ENCOUNTER — Encounter (HOSPITAL_COMMUNITY): Payer: Self-pay | Admitting: *Deleted

## 2017-10-17 ENCOUNTER — Inpatient Hospital Stay (HOSPITAL_COMMUNITY)
Admission: AD | Admit: 2017-10-17 | Discharge: 2017-10-26 | DRG: 885 | Disposition: A | Discharge status: Home / Self Care | Payer: Federal, State, Local not specified - Other | Source: Intra-hospital | Attending: Psychiatry | Admitting: Psychiatry

## 2017-10-17 ENCOUNTER — Other Ambulatory Visit: Payer: Self-pay

## 2017-10-17 ENCOUNTER — Encounter (HOSPITAL_COMMUNITY): Payer: Self-pay | Admitting: Registered Nurse

## 2017-10-17 DIAGNOSIS — F209 Schizophrenia, unspecified: Principal | ICD-10-CM | POA: Diagnosis present

## 2017-10-17 DIAGNOSIS — R000 Tachycardia, unspecified: Secondary | ICD-10-CM | POA: Diagnosis not present

## 2017-10-17 DIAGNOSIS — Z590 Homelessness: Secondary | ICD-10-CM | POA: Diagnosis not present

## 2017-10-17 DIAGNOSIS — F5105 Insomnia due to other mental disorder: Secondary | ICD-10-CM | POA: Diagnosis present

## 2017-10-17 DIAGNOSIS — Z23 Encounter for immunization: Secondary | ICD-10-CM

## 2017-10-17 DIAGNOSIS — Z638 Other specified problems related to primary support group: Secondary | ICD-10-CM

## 2017-10-17 DIAGNOSIS — G4700 Insomnia, unspecified: Secondary | ICD-10-CM | POA: Diagnosis not present

## 2017-10-17 DIAGNOSIS — F200 Paranoid schizophrenia: Secondary | ICD-10-CM | POA: Diagnosis not present

## 2017-10-17 DIAGNOSIS — F419 Anxiety disorder, unspecified: Secondary | ICD-10-CM | POA: Diagnosis not present

## 2017-10-17 DIAGNOSIS — F17210 Nicotine dependence, cigarettes, uncomplicated: Secondary | ICD-10-CM | POA: Diagnosis not present

## 2017-10-17 DIAGNOSIS — R509 Fever, unspecified: Secondary | ICD-10-CM | POA: Diagnosis not present

## 2017-10-17 DIAGNOSIS — R451 Restlessness and agitation: Secondary | ICD-10-CM | POA: Diagnosis not present

## 2017-10-17 MED ORDER — PNEUMOCOCCAL VAC POLYVALENT 25 MCG/0.5ML IJ INJ
0.5000 mL | INJECTION | INTRAMUSCULAR | Status: AC
  Administered 2017-10-18: 0.5 mL via INTRAMUSCULAR

## 2017-10-17 MED ORDER — RISPERIDONE 1 MG PO TABS
1.0000 mg | ORAL_TABLET | Freq: Two times a day (BID) | ORAL | Status: DC
  Administered 2017-10-17: 1 mg via ORAL
  Filled 2017-10-17: qty 1

## 2017-10-17 MED ORDER — HYDROXYZINE HCL 25 MG PO TABS
25.0000 mg | ORAL_TABLET | Freq: Three times a day (TID) | ORAL | Status: DC | PRN
  Administered 2017-10-17 – 2017-10-18 (×2): 25 mg via ORAL
  Filled 2017-10-17 (×2): qty 1

## 2017-10-17 MED ORDER — TRAZODONE HCL 50 MG PO TABS
50.0000 mg | ORAL_TABLET | Freq: Every evening | ORAL | Status: DC | PRN
  Administered 2017-10-17 – 2017-10-25 (×14): 50 mg via ORAL
  Filled 2017-10-17: qty 14
  Filled 2017-10-17: qty 1
  Filled 2017-10-17: qty 14
  Filled 2017-10-17 (×2): qty 1
  Filled 2017-10-17: qty 14
  Filled 2017-10-17 (×4): qty 1
  Filled 2017-10-17 (×2): qty 14
  Filled 2017-10-17: qty 1
  Filled 2017-10-17: qty 14
  Filled 2017-10-17 (×5): qty 1
  Filled 2017-10-17: qty 14
  Filled 2017-10-17: qty 1
  Filled 2017-10-17 (×2): qty 14
  Filled 2017-10-17 (×2): qty 1
  Filled 2017-10-17: qty 14

## 2017-10-17 MED ORDER — INFLUENZA VAC SPLIT QUAD 0.5 ML IM SUSY
0.5000 mL | PREFILLED_SYRINGE | INTRAMUSCULAR | Status: AC
  Administered 2017-10-18: 0.5 mL via INTRAMUSCULAR
  Filled 2017-10-17: qty 0.5

## 2017-10-17 NOTE — Consult Note (Signed)
[place] Psychiatry Consult   Reason for Consult:  Psychosis  Referring Physician:  EDP Patient Identification: Marc [name] MRN:  [id number] Principal Diagnosis: Schizophrenia [facility]) Diagnosis:   Patient Active Problem List   Diagnosis Date Noted  . EPIDURAL ABSCESS [G06.2] 01/01/2007  . DM [E11.9] 12/17/2006  . DYSPHAGIA UNSPECIFIED [R13.10] 12/17/2006  . HYPERTENSION NEC [IMO0002] 12/17/2006    Total Time spent with patient: 30 minutes  Subjective:   Marc [name] is a 62 y.o. male patient admitted with psychosis.  HPI:   Per chart review, patient has been experiencing delusions about a trucking company that is causing him back and rectal pain. He believes that his wife is stealing from him. He has a history of schizophrenia and has not been taking his medications for 1 year. On interview, he reports that he slept well overnight. He is unable to state why he was brought to the hospital. He ruminates about his rectal pain and reports that a trucking company is Tourist information centre manager with his rear and twisting his spine. UA was positive for trace leukocytes and rare bacteria. He denies dysuria. He denies SI, HI or AVH. He reports that he does not take medications and that he is not "crazy."    Past Psychiatric History: Schizophrenia   Risk to Self: Suicidal Ideation: No Suicidal Intent: No Is patient at risk for suicide?: No Suicidal Plan?: No Access to Means: No What has been your use of drugs/alcohol within the last 12 months?: none How many times?: 0 Other Self Harm Risks: none Triggers for Past Attempts: (n/a) Intentional Self Injurious Behavior: None Risk to Others: Homicidal Ideation: No Thoughts of Harm to Others: No Current Homicidal Intent: No Current Homicidal Plan: No Access to Homicidal Means: No Identified Victim: none History of harm to others?: No Assessment of Violence: None Noted Violent Behavior Description: none Does patient have access to weapons?:  No Criminal Charges Pending?: No Does patient have a court date: No Prior Inpatient Therapy: Prior Inpatient Therapy: Yes Prior Therapy Dates: 2015 Prior Therapy Facilty/Provider(s): [facility] Reason for Treatment: schizophrenia Prior Outpatient Therapy: Prior Outpatient Therapy: No Does patient have an ACCT team?: No Does patient have Intensive In-House Services?  : No Does patient have [facility] services? : No Does patient have P4CC services?: No  Past Medical History:  Past Medical History:  Diagnosis Date  . Bipolar 1 disorder ([place])   . Diabetes mellitus without complication ([place])   . Hypertension   . Schizophrenic disorder ([place])    History reviewed. No pertinent surgical history. Family History: History reviewed. No pertinent family history. Family Psychiatric  History: Denies  Social History:  Social History   Substance and Sexual Activity  Alcohol Use Yes   Comment: very little     Social History   Substance and Sexual Activity  Drug Use Never    Social History   Socioeconomic History  . Marital status: Married    Spouse name: Not on file  . Number of children: Not on file  . Years of education: Not on file  . Highest education level: Not on file  Occupational History  . Not on file  Social Needs  . Financial resource strain: Not on file  . Food insecurity:    Worry: Not on file    Inability: Not on file  . Transportation needs:    Medical: Not on file    Non-medical: Not on file  Tobacco Use  . Smoking status: Current Every Day Smoker  Packs/day: 2.00  . Smokeless tobacco: Never Used  Substance and Sexual Activity  . Alcohol use: Yes    Comment: very little  . Drug use: Never  . Sexual activity: Not Currently  Lifestyle  . Physical activity:    Days per week: Not on file    Minutes per session: Not on file  . Stress: Not on file  Relationships  . Social connections:    Talks on phone: Not on file    Gets together: Not on file    Attends  religious service: Not on file    Active member of club or organization: Not on file    Attends meetings of clubs or organizations: Not on file    Relationship status: Not on file  Other Topics Concern  . Not on file  Social History Narrative  . Not on file   Additional Social History: He recently lost his place. He plans to live with his son. He has 2 adult children. He used to work for a [employer]. He last worked in 2008. He denies alcohol or illicit drug use.     Allergies:  No Known Allergies  Labs:  Results for orders placed or performed during the hospital encounter of 10/16/17 (from the past 48 hour(s))  Comprehensive metabolic panel     Status: Abnormal   Collection Time: 10/16/17 11:12 AM  Result Value Ref Range   Sodium 142 135 - 145 mmol/L   Potassium 4.3 3.5 - 5.1 mmol/L   Chloride 103 98 - 111 mmol/L   CO2 29 22 - 32 mmol/L   Glucose, Bld 155 (H) 70 - 99 mg/dL   BUN 14 8 - 23 mg/dL   Creatinine, Ser 0.90 0.61 - 1.24 mg/dL   Calcium 9.2 8.9 - 10.3 mg/dL   Total Protein 6.8 6.5 - 8.1 g/dL   Albumin 4.1 3.5 - 5.0 g/dL   AST 19 15 - 41 U/L   ALT 32 0 - 44 U/L   Alkaline Phosphatase 63 38 - 126 U/L   Total Bilirubin 0.5 0.3 - 1.2 mg/dL   GFR calc non Af Amer >60 >60 mL/min   GFR calc Af Amer >60 >60 mL/min    Comment: (NOTE) The eGFR has been calculated using the CKD EPI equation. This calculation has not been validated in all clinical situations. eGFR's persistently <60 mL/min signify possible Chronic Kidney Disease.    Anion gap 10 5 - 15    Comment: Performed at [facility], [place] [address], [place], [place] [address]  CBC with Differential     Status: Abnormal   Collection Time: 10/16/17 11:12 AM  Result Value Ref Range   WBC 8.2 4.0 - 10.5 K/uL   RBC 5.76 4.22 - 5.81 MIL/uL   Hemoglobin 15.2 13.0 - 17.0 g/dL   HCT 49.6 39.0 - 52.0 %   MCV 86.1 80.0 - 100.0 fL   MCH 26.4 26.0 - 34.0 pg   MCHC 30.6 30.0 - 36.0 g/dL   RDW 15.4  11.5 - 15.5 %   Platelets 146 (L) 150 - 400 K/uL   nRBC 0.0 0.0 - 0.2 %   Neutrophils Relative % 67 %   Neutro Abs 5.4 1.7 - 7.7 K/uL   Lymphocytes Relative 26 %   Lymphs Abs 2.1 0.7 - 4.0 K/uL   Monocytes Relative 6 %   Monocytes Absolute 0.5 0.1 - 1.0 K/uL   Eosinophils Relative 1 %   Eosinophils Absolute 0.1 0.0 -  0.5 K/uL   Basophils Relative 0 %   Basophils Absolute 0.0 0.0 - 0.1 K/uL   Immature Granulocytes 0 %   Abs Immature Granulocytes 0.03 0.00 - 0.07 K/uL    Comment: Performed at [facility], [place] [address], [place], [place] [address]  Urinalysis, Routine w reflex microscopic     Status: Abnormal   Collection Time: 10/16/17 11:12 AM  Result Value Ref Range   Color, Urine YELLOW YELLOW   APPearance CLEAR CLEAR   Specific Gravity, Urine 1.013 1.005 - 1.030   pH 6.0 5.0 - 8.0   Glucose, UA NEGATIVE NEGATIVE mg/dL   Hgb urine dipstick SMALL (A) NEGATIVE   Bilirubin Urine NEGATIVE NEGATIVE   Ketones, ur NEGATIVE NEGATIVE mg/dL   Protein, ur NEGATIVE NEGATIVE mg/dL   Nitrite NEGATIVE NEGATIVE   Leukocytes, UA TRACE (A) NEGATIVE   RBC / HPF 0-5 0 - 5 RBC/hpf   WBC, UA 6-10 0 - 5 WBC/hpf   Bacteria, UA RARE (A) NONE SEEN   Squamous Epithelial / LPF 0-5 0 - 5   Mucus PRESENT     Comment: Performed at [facility], [place] [address] [place], [place] [address]  Ethanol     Status: None   Collection Time: 10/16/17  2:54 PM  Result Value Ref Range   Alcohol, Ethyl (B) <10 <10 mg/dL    Comment: (NOTE) Lowest detectable limit for serum alcohol is 10 mg/dL. For medical purposes only. Performed at [facility], [place] [address], [place], [place] [address]   Rapid urine drug screen (hospital performed)     Status: None   Collection Time: 10/16/17  2:54 PM  Result Value Ref Range   Opiates NONE DETECTED NONE DETECTED   Cocaine NONE DETECTED NONE DETECTED   Benzodiazepines NONE DETECTED NONE DETECTED   Amphetamines NONE  DETECTED NONE DETECTED   Tetrahydrocannabinol NONE DETECTED NONE DETECTED   Barbiturates NONE DETECTED NONE DETECTED    Comment: (NOTE) DRUG SCREEN FOR MEDICAL PURPOSES ONLY.  IF CONFIRMATION IS NEEDED FOR ANY PURPOSE, NOTIFY LAB WITHIN 5 DAYS. LOWEST DETECTABLE LIMITS FOR URINE DRUG SCREEN Drug Class                     Cutoff (ng/mL) Amphetamine and metabolites    1000 Barbiturate and metabolites    200 Benzodiazepine                 003 Tricyclics and metabolites     300 Opiates and metabolites        300 Cocaine and metabolites        300 THC                            50 Performed at [facility], [place] [address], [place], [place] [address]     Current Facility-Administered Medications  Medication Dose Route Frequency Provider Last Rate Last Dose  . acetaminophen (TYLENOL) tablet 650 mg  650 mg Oral Q4H PRN Nanavati, Ankit, MD      . risperiDONE (RISPERDAL M-TABS) disintegrating tablet 2 mg  2 mg Oral Q8H PRN Varney Biles, MD       And  . LORazepam (ATIVAN) tablet 1 mg  1 mg Oral PRN Nanavati, Ankit, MD       And  . ziprasidone (GEODON) injection 20 mg  20 mg Intramuscular PRN Varney Biles, MD  Current Outpatient Medications  Medication Sig Dispense Refill  . naproxen (NAPROSYN) 375 MG tablet Take 1 tablet (375 mg total) by mouth 2 (two) times daily. (Patient not taking: Reported on 10/10/2017) 20 tablet 0  . predniSONE (DELTASONE) 50 MG tablet Take 1 tablet (50 mg total) by mouth daily. (Patient not taking: Reported on 10/16/2017) 5 tablet 0  . traMADol (ULTRAM) 50 MG tablet Take 1 tablet (50 mg total) by mouth every 6 (six) hours as needed. (Patient not taking: Reported on 10/16/2017) 15 tablet 0    Musculoskeletal: Strength & Muscle Tone: within normal limits Gait & Station: normal Patient leans: N/A  Psychiatric Specialty Exam: Physical Exam  Nursing note and vitals reviewed. Constitutional: He is oriented to person, place, and time.  He appears well-developed and well-nourished.  HENT:  Head: Normocephalic and atraumatic.  Neck: Normal range of motion.  Respiratory: Effort normal.  Musculoskeletal: Normal range of motion.  Neurological: He is alert and oriented to person, place, and time.  Psychiatric: He has a normal mood and affect. His speech is normal and behavior is normal. Judgment normal. Thought content is paranoid and delusional. Cognition and memory are normal.    Review of Systems  Genitourinary: Negative for dysuria.  Psychiatric/Behavioral: Negative for hallucinations, substance abuse and suicidal ideas. The patient does not have insomnia.   All other systems reviewed and are negative.   Blood pressure (!) 147/78, pulse 84, temperature 98.9 F (37.2 C), temperature source Oral, resp. rate 16, SpO2 96 %.There is no height or weight on file to calculate BMI.  General Appearance: Fairly Groomed, middle aged, African American male, wearing paper hospital scrubs with gray hair who is standing while speaking. NAD.   Eye Contact:  Good  Speech:  Clear and Coherent and Normal Rate  Volume:  Normal  Mood:  Dysphoric  Affect:  Congruent  Thought Process:  Goal Directed, Linear and Descriptions of Associations: Intact  Orientation:  Full (Time, Place, and Person)  Thought Content:  Delusions and Paranoid Ideation  Suicidal Thoughts:  No  Homicidal Thoughts:  No  Memory:  Immediate;   Good Recent;   Good Remote;   Good  Judgement:  Impaired  Insight:  Poor  Psychomotor Activity:  Restlessness  Concentration:  Concentration: Good and Attention Span: Good  Recall:  Good  Fund of Knowledge:  Good  Language:  Good  Akathisia:  No  Handed:  Right  AIMS (if indicated):    N/A  Assets:  Communication Skills Social Support  ADL's:  Intact  Cognition:  Impaired due to psychiatric condition.   Sleep:   Okay   Assessment:  Marc [name] is a 62 y.o. male who was admitted with psychosis in the setting of  medication noncompliance. He is delusional and paranoid. He warrants inpatient psychiatric hospitalization for stabilization and treatment.   Treatment Plan Summary: Daily contact with patient to assess and evaluate symptoms and progress in treatment and Medication management  -Start Risperdal 1 mg BID for psychosis.  -EKG reviewed and QTc 457 on 10/2. Please closely monitor when starting or increasing QTc prolonging agents.     Disposition: Recommend psychiatric Inpatient admission when medically cleared.  Faythe Dingwall, DO 10/17/2017 11:24 AM

## 2017-10-17 NOTE — Tx Team (Signed)
Initial Treatment Plan 10/17/2017 3:20 PM [name]:[id number]    PATIENT STRESSORS: Marital or family conflict Medication change or noncompliance   PATIENT STRENGTHS: Ability for insight Average or above average intelligence Capable of independent living General fund of knowledge   PATIENT IDENTIFIED PROBLEMS: Paranoia "The trucking company has been harassing me"                     DISCHARGE CRITERIA:  Ability to meet basic life and health needs Improved stabilization in mood, thinking, and/or behavior Verbal commitment to aftercare and medication compliance  PRELIMINARY DISCHARGE PLAN: Attend aftercare/continuing care group Return to previous living arrangement  PATIENT/FAMILY INVOLVEMENT: This treatment plan has been presented to and reviewed with the patient, Marc [name], and/or family member, .  The patient and family have been given the opportunity to ask questions and make suggestions.  Edona Schreffler, [place], [place] 10/17/2017, 3:20 PM

## 2017-10-17 NOTE — Progress Notes (Signed)
Marc [name] is a 62 year old male pt admitted on involuntary basis. 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 He does smell of urine on admission and does report he had a bowel movement yesterday 10/8. He denies any substance abuse issues and reports that he is not on any medications. When asked about his wife and her concerns he became loud and agitated and spoke about how she hasn't lived with him in 5 years so how does she know what is going on. He does report being on medications in the past but expressed in a loud way "I'm not taking any medications!" He reports that he lives alone but reports that he is to go and live with his son when he gets discharged. Devaun was escorted to the unit, oriented to the milieu and safety maintained.

## 2017-10-17 NOTE — Progress Notes (Signed)
The patient stated that he had a good day overall but did not go into further detail. His goal for tomorrow is to complete more "good things".

## 2017-10-17 NOTE — BH Assessment (Signed)
[facility] Assessment Progress Note  Per Juanetta Beets, DO, this pt continues to require psychiatric hospitalization.  Malva Limes, RN, [facility] has assigned pt to [facility] Rm 500-2; [facility] will be ready to receive pt at 13:30.  Pt presents under IVC initiated by Dr Sharma Covert, and IVC documents have been faxed to [facility].  Pt's nurse, Diane, has been notified, and agrees to call report to [phone].  Pt is to be transported via [employer].   Doylene Canning, [place] Behavioral Health Coordinator [phone]

## 2017-10-17 NOTE — Progress Notes (Signed)
Pt was observed in the dayroom, seen pacing. My attended wrap-up group this evening. Pt appears anxious/flat in affect and mood. Pt denies SI/HI/AVH/Pain at this time. Pt was minimal/guareded on approach. Pt c/o of anxiety and insomnia this evening. Provider on call notified. See orders. PRN vistaril and trazoodne requetsed and given. Will continue with POC.

## 2017-10-17 NOTE — ED Notes (Addendum)
Report called to [facility] RN at behavioral health.  Police called for transport.

## 2017-10-18 DIAGNOSIS — F209 Schizophrenia, unspecified: Principal | ICD-10-CM

## 2017-10-18 MED ORDER — ZIPRASIDONE MESYLATE 20 MG IM SOLR: 20.0000 mg | Freq: Two times a day (BID) | INTRAMUSCULAR | Status: DC | PRN

## 2017-10-18 MED ORDER — LORAZEPAM 2 MG/ML IJ SOLN: 2.0000 mg | Freq: Four times a day (QID) | INTRAMUSCULAR | Status: DC | PRN

## 2017-10-18 MED ORDER — OLANZAPINE 10 MG PO TABS
10.0000 mg | ORAL_TABLET | Freq: Every day | ORAL | Status: DC
  Administered 2017-10-18 – 2017-10-21 (×4): 10 mg via ORAL
  Filled 2017-10-18 (×6): qty 1

## 2017-10-18 MED ORDER — OLANZAPINE 5 MG PO TBDP
5.0000 mg | ORAL_TABLET | Freq: Three times a day (TID) | ORAL | Status: DC | PRN
  Administered 2017-10-25: 5 mg via ORAL
  Filled 2017-10-18: qty 1

## 2017-10-18 MED ORDER — LORAZEPAM 1 MG PO TABS
2.0000 mg | ORAL_TABLET | Freq: Four times a day (QID) | ORAL | Status: DC | PRN
  Administered 2017-10-21 – 2017-10-25 (×3): 2 mg via ORAL
  Filled 2017-10-18 (×3): qty 2

## 2017-10-18 MED ORDER — IBUPROFEN 600 MG PO TABS
600.0000 mg | ORAL_TABLET | Freq: Four times a day (QID) | ORAL | Status: DC | PRN
  Administered 2017-10-23: 600 mg via ORAL
  Filled 2017-10-18: qty 1

## 2017-10-18 NOTE — BHH Counselor (Signed)
Adult Comprehensive Assessment  Patient ID: Marc [name], male   DOB: 1955/04/04, 62 y.o.   MRN: [id number]  Information Source: Information source: Patient  Current Stressors:  Patient states their primary concerns and needs for treatment are:: "nothing.  I'm just here" Patient states their goals for this hospitilization and ongoing recovery are:: "They sent me over here, I didn't ask to come." Housing / Lack of housing: Worried about a place to live.  Living/Environment/Situation:  Living Arrangements: Alone Living conditions (as described by patient or guardian): Pt reports he moved out of his apartment one week ago because it had been sold.  Who else lives in the home?: none How long has patient lived in current situation?: Pt reports he has been in a motel for the past week. What is atmosphere in current home: Comfortable, Temporary  Family History:  Marital status: Separated Separated, when?: since 2015 Does patient have children?: Yes How many children?: 2 How is patient's relationship with their children?: son and daughter, good relationships  Childhood History:  Additional childhood history information: Pt was adopted prior to [place].  Bio mother gave pt up voluntarily, pt adopted by family member.  Description of patient's relationship with caregiver when they were a child: Good relationships Patient's description of current relationship with people who raised him/her: Both adoptive parents deceased.  Bio mother still alive: good relationship How were you disciplined when you got in trouble as a child/adolescent?: appropriate physical discipline Does patient have siblings?: Yes Number of Siblings: 3 Description of patient's current relationship with siblings: 3 sisters.  All still living.  Good relationships. Did patient suffer any verbal/emotional/physical/sexual abuse as a child?: No Did patient suffer from severe childhood neglect?: No Has patient ever been  sexually abused/assaulted/raped as an adolescent or adult?: No Was the patient ever a victim of a crime or a disaster?: No Witnessed domestic violence?: No Has patient been effected by domestic violence as an adult?: No  Education:  Highest grade of school patient has completed: HS diploma, 2 years college Currently a Consulting civil engineer?: No Learning disability?: No  Employment/Work Situation:   Employment situation: On disability(money from [place] as well) Why is patient on disability: mental health How long has patient been on disability: 2011 Patient's job has been impacted by current illness: (na) What is the longest time patient has a held a job?: 6.5 years 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   If attempted suicide, did drugs/alcohol play a role in this?: No Alcohol/Substance Abuse Treatment Hx: Denies past history Has alcohol/substance abuse ever caused legal problems?: No  Social Support System:   Patient's Community Support System: Production assistant, radio System: wife, kids Type of faith/religion: Environmental consultant:   Leisure and Hobbies: watch TV  Strengths/Needs:   What is the patient's perception of their strengths?: minding my own business Patient states they can use these personal strengths during their treatment to contribute to their recovery: pt unable to answer Patient states these barriers may affect their return to the community: Pt reports he has his own car  Discharge Plan:  Currently receiving community mental  health services: (Pt has been seen by [facility] in past.) Patient states concerns and preferences for aftercare planning are: No preference Patient states they will know when they are safe and ready for discharge when: "that's up to y'all.  I'm no Doctor" Does patient have access to transportation?: Yes Does patient have financial barriers related to discharge medications?: Yes Patient description of barriers related to discharge medications: no insurance Plan for living situation after discharge: Pt planning to stay with his son upon discharge. Will patient be returning to same living situation after discharge?: No  Summary/Recommendations:   Summary and Recommendations (to be completed by the evaluator): Pt is 63 year old male from [place].  Pt is diagnosed with schizophrenia and was admitted due to paranoia and delusions.  Recommendations for pt include crisis stabilization, therapeutic milieu, attend and participate in groups, medication management, and development of comprehensive mental welllness plan.  Marc [name]. 10/18/2017
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
## 2017-10-18 NOTE — Progress Notes (Signed)
Recreation Therapy Notes  Date: 10.10.19 Time: 1000 Location: 500 Hall Dayroom  Group Topic: Coping Skills  Goal Area(s) Addresses:  Patient will identify positive coping skills. Patient will identify benefits of coping skills.  Behavioral Response: Engaged  Intervention: Worksheet  Activity: Pharmacologist.  Patients were given a worksheet to identify instances in which coping skills would be needed.  Patients were to then come up with coping skills to deal with that situation.  Education: Pharmacologist, Building control surveyor.   Education Outcome: Acknowledges understanding/In group clarification offered/Needs additional education.   Clinical Observations/Feedback: Pt stated getting upset was a situation in which coping skills would be needed.  Pt identified his coping skills as think about what got him upset, communicate with someone about how he's feeling and learn from the actions of others.  Pt also identified depression and finances as areas where coping skills could be used.  Pt coping skills for these were forget out about what made you depressed, cry about it, speak to a financial advisor, budget your money and live within your means.  Pt also made it a point to say "black people shouldn't be stressed about nothing".    Caroll Rancher, LRT/CTRS     Caroll Rancher A 10/18/2017 10:32 AM

## 2017-10-18 NOTE — [facility] Suicide Risk Assessment (Signed)
[facility] INPATIENT:  Family/Significant Other Suicide Prevention Education  Suicide Prevention Education:  Contact Attempts: Elwin Tsou, ex wife, [phone], has been identified by the patient as the family member/significant other with whom the patient will be residing, and identified as the person(s) who will aid the patient in the event of a mental health crisis.  With written consent from the patient, two attempts were made to provide suicide prevention education, prior to and/or following the patient's discharge.  We were unsuccessful in providing suicide prevention education.  A suicide education pamphlet was given to the patient to share with family/significant other.  Date and time of first attempt:10/18/17, 1045 Date and time of second attempt:  [name], LCSW 10/18/2017, 10:44 AM

## 2017-10-18 NOTE — Plan of Care (Signed)
Progress Note  D: pt found in bed; compliant with medication administration. Pt states he slept well last night. Pt rates his depression/hopelessness/anxiety all 0/10. Pt denies any physical symptoms and rates his pain at a 0/10. Pt states his goal for today is to live but failed to state how he would achieve this. Pt denies any si/hi/ah/vh and verbally agrees to approach staff if these become apparent or before harming himself while at [facility]. A: pt provided support and encouragement. Pt given medications per protocol and standing orders. Q37m safety checks implemented and continued. R: pt safe on the unit. Will continue to monitor.   Pt progressing in the following metrics  Problem: Education: Goal: Knowledge of [place] [facility] Education information/materials will improve Outcome: Progressing Goal: Emotional status will improve Outcome: Progressing Goal: Mental status will improve Outcome: Progressing Goal: Verbalization of understanding the information provided will improve Outcome: Progressing   Problem: Activity: Goal: Interest or engagement in activities will improve Outcome: Progressing Goal: Sleeping patterns will improve Outcome: Progressing
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
## 2017-10-18 NOTE — Progress Notes (Signed)
Recreation Therapy Notes  INPATIENT RECREATION THERAPY ASSESSMENT  Patient Details Name: Marc [name] MRN: [id number] DOB: 04-19-1955 Today's Date: 10/18/2017       Information Obtained From: Patient  Able to Participate in Assessment/Interview: Yes  Patient Presentation: Alert  Reason for Admission (Per Patient): Other (Comments)(Pt stated "nothing")  Patient Stressors: (Pt identified no stresssors)  Coping Skills:   TV, Sports, Music, Talk, Prayer, Hot Bath/Shower  Leisure Interests (2+):  Individual - Other (Comment), Nature - Other (Comment)("mind my own business", "sit on the porch", watch "football, baseball & basketball")  Frequency of Recreation/Participation: Other (Comment)(Daily)  Awareness of Community Resources:  No  Expressed Interest in [employer] Information: No  County of Residence:  [place]  Patient Main Form of Transportation: Car  Patient Strengths:  Family; Sports  Patient Identified Areas of Improvement:  "Nothing"  Patient Goal for Hospitalization:  "Get out"  Current SI (including self-harm):  No  Current HI:  No  Current AVH: No  Staff Intervention Plan: Group Attendance, Collaborate with Interdisciplinary Treatment Team  Consent to Intern Participation: N/A   Caroll Rancher, LRT/CTRS  Caroll Rancher A 10/18/2017, 11:42 AM

## 2017-10-18 NOTE — [facility] Group Notes (Signed)
[facility] Group Notes:  (Nursing/MHT/Case Management/Adjunct)  Date:  10/18/2017  Time:  4:00 PM  Type of Therapy:  Nurse Education  Participation Level:  Active  Participation Quality:  Appropriate and Attentive  Affect:  Appropriate  Cognitive:  Alert and Appropriate  Insight:  Appropriate  Engagement in Group:  Engaged  Modes of Intervention:  Discussion and Education  Summary of Progress/Problems: pt's discussed positive and negative coping mechanisms they can use when adversity arises.   Marc [name] 10/18/2017, 4:30 PM

## 2017-10-18 NOTE — Progress Notes (Signed)
Adult Psychoeducational Group Note  Date:  10/18/2017 Time:  8:29 PM  Group Topic/Focus:  Wrap-Up Group:   The focus of this group is to help patients review their daily goal of treatment and discuss progress on daily workbooks.  Participation Level:  Active  Participation Quality:  Appropriate  Affect:  Appropriate  Cognitive:  Alert  Insight: Appropriate  Engagement in Group:  Engaged  Modes of Intervention:  Discussion  Additional Comments:  Patient stated having a great day. Patient's goal for today was to get a flu shot. Patient met goal.  Abdullah Rizzi L Aamari Strawderman 10/18/2017, 8:29 PM

## 2017-10-18 NOTE — [facility] Group Notes (Signed)
LCSW Group Therapy Note  10/18/2017 1:15pm  Type of Therapy/Topic:  Group Therapy:  Balance in Life  Participation Level:  Active  Description of Group:    This group will address the concept of balance and how it feels and looks when one is unbalanced. Patients will be encouraged to process areas in their lives that are out of balance and identify reasons for remaining unbalanced. Facilitators will guide patients in utilizing problem-solving interventions to address and correct the stressor making their life unbalanced. Understanding and applying boundaries will be explored and addressed for obtaining and maintaining a balanced life. Patients will be encouraged to explore ways to assertively make their unbalanced needs known to significant others in their lives, using other group members and facilitator for support and feedback.  Therapeutic Goals: 1. Patient will identify two or more emotions or situations they have that consume much of in their lives. 2. Patient will identify signs/triggers that life has become out of balance:  3. Patient will identify two ways to set boundaries in order to achieve balance in their lives:  4. Patient will demonstrate ability to communicate their needs through discussion and/or role plays  Summary of Patient Progress:  Stayed the entire time, engaged throughout. Somewhat intrusive, but redirectable.  Grounded in reality-no signs nor symptoms of psychosis.  Talked about his grandmother who was his role model.    Therapeutic Modalities:   Cognitive Behavioral Therapy Solution-Focused Therapy Assertiveness Training  Ida Rogue, [place] 10/18/2017 2:54 PM

## 2017-10-18 NOTE — [facility] Suicide Risk Assessment (Signed)
[facility] Admission Suicide Risk Assessment   Nursing information obtained from:  Patient Demographic factors:  Male, Living alone, Unemployed Current Mental Status:  NA Loss Factors:  NA Historical Factors:  Family history of mental illness or substance abuse Risk Reduction Factors:  Positive coping skills or problem solving skills  Total Time spent with patient: 1 hour Principal Problem: Schizophrenia (HCC) Diagnosis:   Patient Active Problem List   Diagnosis Date Noted  . Schizophrenia (HCC) [F20.9]   . EPIDURAL ABSCESS [G06.2] 01/01/2007  . DM [E11.9] 12/17/2006  . DYSPHAGIA UNSPECIFIED [R13.10] 12/17/2006  . HYPERTENSION NEC [IMO0002] 12/17/2006   Subjective Data: see H&P  Continued Clinical Symptoms:  Alcohol Use Disorder Identification Test Final Score (AUDIT): 1 The "Alcohol Use Disorders Identification Test", Guidelines for Use in Primary Care, Second Edition.  World Science writer [facility]). Score between 0-7:  no or low risk or alcohol related problems. Score between 8-15:  moderate risk of alcohol related problems. Score between 16-19:  high risk of alcohol related problems. Score 20 or above:  warrants further diagnostic evaluation for alcohol dependence and treatment.     Psychiatric Specialty Exam: Physical Exam  Nursing note and vitals reviewed.     Blood pressure (!) 115/91, pulse (!) 108, temperature 99.8 F (37.7 C), temperature source Oral, resp. rate 20, height 5\' 10"  (1.778 m), weight 106.6 kg.Body mass index is 33.72 kg/m.     COGNITIVE FEATURES THAT CONTRIBUTE TO RISK:  Closed-mindedness, Polarized thinking and Thought constriction (tunnel vision)    SUICIDE RISK:   Minimal: No identifiable suicidal ideation.  Patients presenting with no risk factors but with morbid ruminations; may be classified as minimal risk based on the severity of the depressive symptoms  PLAN OF CARE: see H&P  I certify that inpatient services furnished can reasonably be  expected to improve the patient's condition.   Micheal Likens, MD 10/18/2017, 3:35 PM

## 2017-10-18 NOTE — [facility] Suicide Risk Assessment (Signed)
[facility] INPATIENT:  Family/Significant Other Suicide Prevention Education  Suicide Prevention Education:  Contact Attempts: Elsie Sakuma, son, [phone], has been identified by the patient as the family member/significant other with whom the patient will be residing, and identified as the person(s) who will aid the patient in the event of a mental health crisis.  With written consent from the patient, two attempts were made to provide suicide prevention education, prior to and/or following the patient's discharge.  We were unsuccessful in providing suicide prevention education.  A suicide education pamphlet was given to the patient to share with family/significant other.  Date and time of first attempt:10/18/17, 1046 Date and time of second attempt:  [name], LCSW 10/18/2017, 10:46 AM

## 2017-10-19 DIAGNOSIS — F419 Anxiety disorder, unspecified: Secondary | ICD-10-CM

## 2017-10-19 DIAGNOSIS — G4700 Insomnia, unspecified: Secondary | ICD-10-CM

## 2017-10-19 DIAGNOSIS — R451 Restlessness and agitation: Secondary | ICD-10-CM

## 2017-10-19 MED ORDER — HYDROXYZINE HCL 50 MG PO TABS
50.0000 mg | ORAL_TABLET | Freq: Four times a day (QID) | ORAL | Status: DC | PRN
  Administered 2017-10-19 – 2017-10-25 (×6): 50 mg via ORAL
  Filled 2017-10-19: qty 10
  Filled 2017-10-19 (×6): qty 1

## 2017-10-19 NOTE — Progress Notes (Addendum)
Nursing note 7p-7a  Pt observed interacting with peers on unit this shift. Displayed a flat affect and flat/irritable mood upon interaction with this Clinical research associate. Pt denies pain ,denies SI/HI, and also denies any audio or visual hallucinations at this time. Complains of anxiety see MAR for prn medication administration. Goal:" to talk to the Dr." Pt is now resting in bed with eyes closed, with no signs or symptoms of pain or distress noted. Pt continues to remain safe on the unit and is observed by rounding every 15 min. RN will continue to monitor.

## 2017-10-19 NOTE — Plan of Care (Signed)
  Problem: Activity: Goal: Sleeping patterns will improve Outcome: Progressing   Problem: Coping: Goal: Ability to demonstrate self-control will improve Outcome: Progressing   

## 2017-10-19 NOTE — [facility] Group Notes (Signed)
Group Therapy: Chaplain-Led Processing Group  Participation Level:  Active  Participation Quality:  Attentive  Affect:  Flat  Cognitive:  Oriented  Insight:  Limited  Engagement in Therapy:  Limited  Modes of Intervention:  Discussion, Socialization  Summary of Progress/Problems:  Chaplain was here to lead a group on themes of hope and courage. Marc [name] attended the entire session. He was engaged and participated in the discussion. He described "grandkids" as a source of hope for himself. He stated that his granddaughter makes him feel hopeful. "I was the baby..." He shared that hope is to "never give up".  Marian Sorrow MSW Intern 10/19/2017 1:20 PM

## 2017-10-19 NOTE — Plan of Care (Signed)
Problem: Education: Goal: Knowledge of [facility] Education information/materials will improve Outcome: Progressing Goal: Emotional status will improve Outcome: Progressing Goal: Mental status will improve Outcome: Progressing Goal: Verbalization of understanding the information provided will improve Outcome: Progressing   Problem: Activity: Goal: Interest or engagement in activities will improve Outcome: Progressing Goal: Sleeping patterns will improve Outcome: Progressing   Problem: Coping: Goal: Ability to verbalize frustrations and anger appropriately will improve Outcome: Progressing Goal: Ability to demonstrate self-control will improve Outcome: Progressing   Problem: Health Behavior/Discharge Planning: Goal: Identification of resources available to assist in meeting health care needs will improve Outcome: Progressing Goal: Compliance with treatment plan for underlying cause of condition will improve Outcome: Progressing   Problem: Physical Regulation: Goal: Ability to maintain clinical measurements within normal limits will improve Outcome: Progressing   Problem: Safety: Goal: Periods of time without injury will increase Outcome: Progressing   Problem: Education: Goal: Knowledge of [facility] Education information/materials will improve Outcome: Progressing Goal: Emotional status will improve Outcome: Progressing Goal: Mental status will improve Outcome: Progressing Goal: Verbalization of understanding the information provided will improve Outcome: Progressing   Problem: Activity: Goal: Interest or engagement in activities will improve Outcome: Progressing Goal: Sleeping patterns will improve Outcome: Progressing   Problem: Coping: Goal: Ability to verbalize frustrations and anger appropriately will improve Outcome: Progressing Goal: Ability to demonstrate self-control will improve Outcome: Progressing   Problem: Health  Behavior/Discharge Planning: Goal: Identification of resources available to assist in meeting health care needs will improve Outcome: Progressing Goal: Compliance with treatment plan for underlying cause of condition will improve Outcome: Progressing   Problem: Physical Regulation: Goal: Ability to maintain clinical measurements within normal limits will improve Outcome: Progressing   Problem: Safety: Goal: Periods of time without injury will increase Outcome: Progressing   Problem: Activity: Goal: Will verbalize the importance of balancing activity with adequate rest periods Outcome: Progressing   Problem: Education: Goal: Will be free of psychotic symptoms Outcome: Progressing Goal: Knowledge of the prescribed therapeutic regimen will improve Outcome: Progressing   Problem: Coping: Goal: Coping ability will improve Outcome: Progressing Goal: Will verbalize feelings Outcome: Progressing   Problem: Health Behavior/Discharge Planning: Goal: Compliance with prescribed medication regimen will improve Outcome: Progressing   Problem: Nutritional: Goal: Ability to achieve adequate nutritional intake will improve Outcome: Progressing   Problem: Role Relationship: Goal: Ability to communicate needs accurately will improve Outcome: Progressing Goal: Ability to interact with others will improve Outcome: Progressing   Problem: Safety: Goal: Ability to redirect hostility and anger into socially appropriate behaviors will improve Outcome: Progressing Goal: Ability to remain free from injury will improve Outcome: Progressing   Problem: Self-Care: Goal: Ability to participate in self-care as condition permits will improve Outcome: Progressing   Problem: Self-Concept: Goal: Will verbalize positive feelings about self Outcome: Progressing   Problem: Education: Goal: Utilization of techniques to improve thought processes will improve Outcome: Progressing Goal: Knowledge of  the prescribed therapeutic regimen will improve Outcome: Progressing   Problem: Activity: Goal: Interest or engagement in leisure activities will improve Outcome: Progressing Goal: Imbalance in normal sleep/wake cycle will improve Outcome: Progressing   Problem: Coping: Goal: Coping ability will improve Outcome: Progressing Goal: Will verbalize feelings Outcome: Progressing   Problem: Health Behavior/Discharge Planning: Goal: Ability to make decisions will improve Outcome: Progressing Goal: Compliance with therapeutic regimen will improve Outcome: Progressing   Problem: Role Relationship: Goal: Will demonstrate positive changes in social behaviors and relationships Outcome: Progressing   Problem: Safety: Goal:  Ability to disclose and discuss suicidal ideas will improve Outcome: Progressing Goal: Ability to identify and utilize support systems that promote safety will improve Outcome: Progressing   Problem: Self-Concept: Goal: Will verbalize positive feelings about self Outcome: Progressing Goal: Level of anxiety will decrease Outcome: Progressing

## 2017-10-19 NOTE — Progress Notes (Signed)
[facility] MD Progress Note  10/19/2017 1:34 PM CLAY SOLUM  MRN:  [id number] Subjective:    Marc [name] is a 62 y/o M with history of schizophrenia who was admitted from [facility] on IVC placed in ED with worsening symptoms of psychosis, paranoia, irritability, and delusion that he is suffering back pain due to previous employer in [place]. Pt also has stressor of recent homelessness, and poor social support.  He had not been taking any medications outside the hospital. He was medically cleared and then transferred to [facility] for additional treatment and stabilization. He was started on trial of zyprexa.  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 Pt reports that his symptoms are unchanged, but they are not overly bothersome or intrusive. Pt was reminded about availability of PRN ibuprofen, and he verbalizes that he feels his pain is manageable without medication. He denies SI/HI/AH/VH. He has poor hygiene and he is malodorous; pt was encouraged to focus on his hygiene during his stay. He reports that he slept well last night, and he feels that he is tolerating his medications well. He is in agreement to continue his current regimen without changes. He had no further questions, comments, or concerns.    Principal Problem: Schizophrenia (HCC) Diagnosis:   Patient Active Problem List   Diagnosis Date Noted  . Schizophrenia (HCC) [F20.9]   . EPIDURAL ABSCESS [G06.2] 01/01/2007  . DM [E11.9] 12/17/2006  . DYSPHAGIA UNSPECIFIED [R13.10] 12/17/2006  . HYPERTENSION NEC [IMO0002] 12/17/2006   Total Time spent with patient: 30 minutes  Past Psychiatric History: see H&P  Past Medical History:  Past Medical History:  Diagnosis Date  . Bipolar 1 disorder (HCC)   . Diabetes  mellitus without complication (HCC)   . Hypertension   . Schizophrenic disorder (HCC)    History reviewed. No pertinent surgical history. Family History: History reviewed. No pertinent family history. Family Psychiatric  History: see H&P Social History:  Social History   Substance and Sexual Activity  Alcohol Use Yes   Comment: very little     Social History   Substance and Sexual Activity  Drug Use Never    Social History   Socioeconomic History  . Marital status: Married    Spouse name: Not on file  . Number of children: Not on file  . Years of education: Not on file  . Highest education level: Not on file  Occupational History  . Not on file  Social Needs  . Financial resource strain: Not on file  . Food insecurity:    Worry: Not on file    Inability: Not on file  . Transportation needs:    Medical: Not on file    Non-medical: Not on file  Tobacco Use  . Smoking status: Current Every Day Smoker    Packs/day: 2.00  . Smokeless tobacco: Never Used  Substance and Sexual Activity  . Alcohol use: Yes    Comment: very little  . Drug use: Never  . Sexual activity: Not Currently  Lifestyle  . Physical activity:    Days per week: Not on file    Minutes per session: Not on file  . Stress: Not on file  Relationships  . Social connections:    Talks on phone: Not on file    Gets together: Not  on file    Attends religious service: Not on file    Active member of club or organization: Not on file    Attends meetings of clubs or organizations: Not on file    Relationship status: Not on file  Other Topics Concern  . Not on file  Social History Narrative  . Not on file   Additional Social History:                         Sleep: Good  Appetite:  Good  Current Medications: Current Facility-Administered Medications  Medication Dose Route Frequency Provider Last Rate Last Dose  . hydrOXYzine (ATARAX/VISTARIL) tablet 25 mg  25 mg Oral TID PRN Nira Conn A, NP   25 mg at 10/18/17 2111  . ibuprofen (ADVIL,MOTRIN) tablet 600 mg  600 mg Oral Q6H PRN Micheal Likens, MD      . LORazepam (ATIVAN) tablet 2 mg  2 mg Oral Q6H PRN Micheal Likens, MD       Or  . LORazepam (ATIVAN) injection 2 mg  2 mg Intramuscular Q6H PRN Micheal Likens, MD      . OLANZapine (ZYPREXA) tablet 10 mg  10 mg Oral QHS Micheal Likens, MD   10 mg at 10/18/17 2113  . OLANZapine zydis (ZYPREXA) disintegrating tablet 5 mg  5 mg Oral Q8H PRN Micheal Likens, MD       Or  . ziprasidone (GEODON) injection 20 mg  20 mg Intramuscular Q12H PRN Micheal Likens, MD      . traZODone (DESYREL) tablet 50 mg  50 mg Oral QHS,MR X 1 Jackelyn Poling, NP   Stopped at 10/19/17 0201    Lab Results: No results found for this or any previous visit (from the past 48 hour(s)).  Blood Alcohol level:  Lab Results  Component Value Date   ETH <10 10/16/2017   ETH <11 05/08/2013    Metabolic Disorder Labs: Lab Results  Component Value Date   HGBA1C (H) 08/09/2007    6.5 (NOTE)   The ADA recommends the following therapeutic goal for glycemic   control related to Hgb A1C measurement:   Goal of Therapy:   < 7.0% Hgb A1C   Reference: American Diabetes Association: Clinical Practice   Recommendations 2008, Diabetes Care,  2008, 31:(Suppl 1).   MPG 154 08/09/2007   MPG 343 10/16/2006   No results found for: PROLACTIN No results found for: CHOL, TRIG, HDL, CHOLHDL, VLDL, LDLCALC  Physical Findings: AIMS: Facial and Oral Movements Muscles of Facial Expression: None, normal Lips and Perioral Area: None, normal Jaw: None, normal Tongue: None, normal,Extremity Movements Upper (arms, wrists, hands, fingers): None, normal Lower (legs, knees, ankles, toes): None, normal, Trunk Movements Neck, shoulders, hips: None, normal, Overall Severity Severity of abnormal movements (highest score from questions above): None, normal Incapacitation  due to abnormal movements: None, normal Patient's awareness of abnormal movements (rate only patient's report): No Awareness, Dental Status Current problems with teeth and/or dentures?: No Does patient usually wear dentures?: No  CIWA:    COWS:     Musculoskeletal: Strength & Muscle Tone: within normal limits Gait & Station: normal Patient leans: N/A  Psychiatric Specialty Exam: Physical Exam  Nursing note and vitals reviewed.   Review of Systems  Constitutional: Negative for chills and fever.  Respiratory: Negative for cough and shortness of breath.   Cardiovascular: Negative for chest pain.  Gastrointestinal: Negative for abdominal pain,  heartburn, nausea and vomiting.  Psychiatric/Behavioral: Negative for depression, hallucinations and suicidal ideas. The patient is not nervous/anxious and does not have insomnia.     Blood pressure 125/71, pulse (!) 116, temperature 99.2 F (37.3 C), temperature source Oral, resp. rate 16, height 5\' 10"  (1.778 m), weight 106.6 kg.Body mass index is 33.72 kg/m.  General Appearance: Casual and Disheveled  Eye Contact:  Good  Speech:  Clear and Coherent and Normal Rate  Volume:  Normal  Mood:  Euthymic  Affect:  Blunt and Congruent  Thought Process:  Coherent and Goal Directed  Orientation:  Full (Time, Place, and Person)  Thought Content:  Delusions, Ideas of Reference:   Paranoia Delusions and Paranoid Ideation  Suicidal Thoughts:  No  Homicidal Thoughts:  No  Memory:  Immediate;   Fair Recent;   Fair Remote;   Fair  Judgement:  Poor  Insight:  Lacking  Psychomotor Activity:  Normal  Concentration:  Concentration: Good  Recall:  Good  Fund of Knowledge:  Fair  Language:  Fair  Akathisia:  No  Handed:    AIMS (if indicated):     Assets:  Resilience Social Support  ADL's:  Intact  Cognition:  WNL  Sleep:  Number of Hours: 6.25     Treatment Plan Summary: Daily contact with patient to assess and evaluate symptoms and  progress in treatment and Medication management   -Continue inpatient hospitalization  -Schizophrenia  -Continue zyprexa 10mg  po qhs  -anxiety  -Continue vistaril 50mg  po q6h prn anxiety  -agitation     -Continue ativan 2mg  po/IM q6h prn anxiety   -Continue zydis 5mg  po q8h prn agitation   -Continue geodon 20mg  IM q12h prn severe agitation  -insomnia  -Continue trazodone 50mg  po qhs prn insomnia  -Encourage participation in groups and therapeutic milieu  -disposition planning will be ongoing  Micheal Likens, MD 10/19/2017, 1:34 PM

## 2017-10-19 NOTE — Plan of Care (Signed)
Progress note  D: pt found in bed; allowed to rest since he didn't have any morning meds. Pt states he slept well upon awakening. Pt denies any depression/hopelessness/anxiety, rating this all 0/10. Pt denies any physical symptoms and denies any physical pain, rating this a 0/10. Pt states his goal for today is to be helpful. Pt denies any si/hi/ah/vh and verbally agrees to approach staff if these become apparent or before harming himself while at [facility]. A: pt provided support and encouragement. Pt given medications per protocol and standing orders. Q53m safety checks implemented and continued.  R: pt safe on the unit. Will continue to monitor.   Pt progressing in the following metrics  Problem: Health Behavior/Discharge Planning: Goal: Identification of resources available to assist in meeting health care needs will improve Outcome: Progressing Goal: Compliance with treatment plan for underlying cause of condition will improve Outcome: Progressing   Problem: Physical Regulation: Goal: Ability to maintain clinical measurements within normal limits will improve Outcome: Progressing   Problem: Safety: Goal: Periods of time without injury will increase Outcome: Progressing   Problem: Education: Goal: Knowledge of [facility] Education information/materials will improve Outcome: Progressing Goal: Emotional status will improve Outcome: Progressing

## 2017-10-19 NOTE — Tx Team (Signed)
Interdisciplinary Treatment and Diagnostic Plan Update  10/19/2017 Time of Session: 9:13 AM  BRAIN HONEYCUTT MRN: [id number]  Principal Diagnosis: Schizophrenia [facility])  Secondary Diagnoses: Principal Problem:   Schizophrenia ([place])   Current Medications:  Current Facility-Administered Medications  Medication Dose Route Frequency Provider Last Rate Last Dose  . hydrOXYzine (ATARAX/VISTARIL) tablet 25 mg  25 mg Oral TID PRN Lindon Romp A, NP   25 mg at 10/18/17 2111  . ibuprofen (ADVIL,MOTRIN) tablet 600 mg  600 mg Oral Q6H PRN Pennelope Bracken, MD      . LORazepam (ATIVAN) tablet 2 mg  2 mg Oral Q6H PRN Pennelope Bracken, MD       Or  . LORazepam (ATIVAN) injection 2 mg  2 mg Intramuscular Q6H PRN Pennelope Bracken, MD      . OLANZapine (ZYPREXA) tablet 10 mg  10 mg Oral QHS Pennelope Bracken, MD   10 mg at 10/18/17 2113  . OLANZapine zydis (ZYPREXA) disintegrating tablet 5 mg  5 mg Oral Q8H PRN Pennelope Bracken, MD       Or  . ziprasidone (GEODON) injection 20 mg  20 mg Intramuscular Q12H PRN Pennelope Bracken, MD      . traZODone (DESYREL) tablet 50 mg  50 mg Oral QHS,MR X 1 Rozetta Nunnery, NP   Stopped at 10/19/17 0201    PTA Medications: Medications Prior to Admission  Medication Sig Dispense Refill Last Dose  . naproxen (NAPROSYN) 375 MG tablet Take 1 tablet (375 mg total) by mouth 2 (two) times daily. (Patient not taking: Reported on 10/10/2017) 20 tablet 0 Not Taking at Unknown time  . predniSONE (DELTASONE) 50 MG tablet Take 1 tablet (50 mg total) by mouth daily. (Patient not taking: Reported on 10/16/2017) 5 tablet 0 Not Taking at Unknown time  . traMADol (ULTRAM) 50 MG tablet Take 1 tablet (50 mg total) by mouth every 6 (six) hours as needed. (Patient not taking: Reported on 10/16/2017) 15 tablet 0 Not Taking at Unknown time    Patient Stressors: Marital or family conflict Medication change or noncompliance  Patient Strengths:  Ability for insight Average or above average intelligence Capable of independent living General fund of knowledge  Treatment Modalities: Medication Management, Group therapy, Case management,  1 to 1 session with clinician, Psychoeducation, Recreational therapy.   Physician Treatment Plan for Primary Diagnosis: Schizophrenia ([place]) Long Term Goal(s): Improvement in symptoms so as ready for discharge  Short Term Goals: Ability to identify and develop effective coping behaviors will improve Ability to maintain clinical measurements within normal limits will improve  Medication Management: Evaluate patient's response, side effects, and tolerance of medication regimen.  Therapeutic Interventions: 1 to 1 sessions, Unit Group sessions and Medication administration.  Evaluation of Outcomes: Progressing  Physician Treatment Plan for Secondary Diagnosis: Principal Problem:   Schizophrenia ([place])   Long Term Goal(s): Improvement in symptoms so as ready for discharge  Short Term Goals: Ability to identify and develop effective coping behaviors will improve Ability to maintain clinical measurements within normal limits will improve  Medication Management: Evaluate patient's response, side effects, and tolerance of medication regimen.  Therapeutic Interventions: 1 to 1 sessions, Unit Group sessions and Medication administration.  Evaluation of Outcomes: Progressing   RN Treatment Plan for Primary Diagnosis: Schizophrenia ([place]) Long Term Goal(s): Knowledge of disease and therapeutic regimen to maintain health will improve  Short Term Goals: Ability to identify and develop effective coping behaviors will improve and Compliance with prescribed  medications will improve  Medication Management: RN will administer medications as ordered by provider, will assess and evaluate patient's response and provide education to patient for prescribed medication. RN will report any adverse and/or side  effects to prescribing provider.  Therapeutic Interventions: 1 on 1 counseling sessions, Psychoeducation, Medication administration, Evaluate responses to treatment, Monitor vital signs and CBGs as ordered, Perform/monitor CIWA, COWS, AIMS and Fall Risk screenings as ordered, Perform wound care treatments as ordered.  Evaluation of Outcomes: Progressing   LCSW Treatment Plan for Primary Diagnosis: Schizophrenia ([place]) Long Term Goal(s): Safe transition to appropriate next level of care at discharge, Engage patient in therapeutic group addressing interpersonal concerns.  Short Term Goals: Engage patient in aftercare planning with referrals and resources  Therapeutic Interventions: Assess for all discharge needs, 1 to 1 time with Social worker, Explore available resources and support systems, Assess for adequacy in community support network, Educate family and significant other(s) on suicide prevention, Complete Psychosocial Assessment, Interpersonal group therapy.  Evaluation of Outcomes: Met Stay with son, follow up [facility]   Progress in Treatment: Attending groups: Yes Participating in groups: Yes Taking medication as prescribed: Yes Toleration medication: Yes, no side effects reported at this time Family/Significant other contact made:  Patient understands diagnosis: Yes AEB Discussing patient identified problems/goals with staff: Yes Medical problems stabilized or resolved: Yes Denies suicidal/homicidal ideation: Yes Issues/concerns per patient self-inventory: None Other: N/A  New problem(s) identified: None identified at this time.   New Short Term/Long Term Goal(s): "Get better."  Discharge Plan or Barriers:   Reason for Continuation of Hospitalization: Paranoia Hallucinations  Medication stabilization   Estimated Length of Stay: 10/16  Attendees: Patient: Marc [name] 10/19/2017  9:13 AM  Physician: Maris Berger, MD 10/19/2017  9:13 AM  Nursing: Jeanie Cooks  RN 10/19/2017  9:13 AM  RN Care Manager: Lars Pinks, RN 10/19/2017  9:13 AM  Social Worker: Ripley Fraise 10/19/2017  9:13 AM  Recreational Therapist: Winfield Cunas 10/19/2017  9:13 AM  Other: Norberto Sorenson 10/19/2017  9:13 AM  Other:  10/19/2017  9:13 AM    Scribe for Treatment Team:  Roque Lias LCSW 10/19/2017 9:13 AM

## 2017-10-19 NOTE — Progress Notes (Signed)
Recreation Therapy Notes  Date: 10.11.19 Time: 1000 Location: [address] Dayroom   Group Topic: Communication, Team Building, Problem Solving  Goal Area(s) Addresses:  Patient will effectively work with peer towards shared goal.  Patient will identify skills used to make activity successful.  Patient will identify how skills used during activity can be used to reach post d/c goals.   Intervention: STEM Activity, Music  Activity: Landing Pad. In teams patients were given 12 plastic drinking straws and a length of masking tape. Using the materials provided patients were asked to build a landing pad to catch a golf ball dropped from approximately 6 feet in the air.   Education: Pharmacist, community, Discharge Planning   Education Outcome: Acknowledges education/In group clarification offered/Needs additional education.   Clinical Observations/Feedback: Pt did not attend group.    Caroll Rancher, LRT/CTRS         Lillia Abed, Hindy Perrault A 10/19/2017 11:30 AM

## 2017-10-19 NOTE — Progress Notes (Addendum)
D: Patient observed up and around milieu. Intrusive at times. Frequently up at the nurses' station. Patient jovial, talkative. Patient's affect animated, mood pleasant.  Denies pain, physical complaints.   A: Medicated per orders, no prns requested or needed. Medication education provided. Level III obs in place for safety. Emotional support offered. Patient encouraged to attend and participate in unit programming.  Fall prevention plan in place and reviewed with patient.   R: Patient verbalizes understanding of POC, falls prevention education.  Patient denies SI/HI/AVH and remains safe on level III obs. Will continue to monitor throughout the night.

## 2017-10-20 LAB — CBC WITH DIFFERENTIAL/PLATELET
Abs Immature Granulocytes: 0.02 10*3/uL (ref 0.00–0.07)
BASOS ABS: 0 10*3/uL (ref 0.0–0.1)
Basophils Relative: 0 %
Eosinophils Absolute: 0.1 10*3/uL (ref 0.0–0.5)
Eosinophils Relative: 2 %
HEMATOCRIT: 46.8 % (ref 39.0–52.0)
Hemoglobin: 14 g/dL (ref 13.0–17.0)
Immature Granulocytes: 0 %
LYMPHS ABS: 2.1 10*3/uL (ref 0.7–4.0)
LYMPHS PCT: 38 %
MCH: 25.7 pg — ABNORMAL LOW (ref 26.0–34.0)
MCHC: 29.9 g/dL — ABNORMAL LOW (ref 30.0–36.0)
MCV: 86 fL (ref 80.0–100.0)
MONOS PCT: 11 %
Monocytes Absolute: 0.6 10*3/uL (ref 0.1–1.0)
NEUTROS PCT: 49 %
NRBC: 0 % (ref 0.0–0.2)
Neutro Abs: 2.6 10*3/uL (ref 1.7–7.7)
Platelets: 166 10*3/uL (ref 150–400)
RBC: 5.44 MIL/uL (ref 4.22–5.81)
RDW: 15.4 % (ref 11.5–15.5)
WBC: 5.5 10*3/uL (ref 4.0–10.5)

## 2017-10-20 LAB — URINALYSIS, COMPLETE (UACMP) WITH MICROSCOPIC
Bacteria, UA: NONE SEEN
Bilirubin Urine: NEGATIVE
GLUCOSE, UA: NEGATIVE mg/dL
Hgb urine dipstick: NEGATIVE
Ketones, ur: NEGATIVE mg/dL
Leukocytes, UA: NEGATIVE
Nitrite: NEGATIVE
PH: 7 (ref 5.0–8.0)
Protein, ur: NEGATIVE mg/dL
SPECIFIC GRAVITY, URINE: 1.014 (ref 1.005–1.030)

## 2017-10-20 NOTE — [facility] Group Notes (Signed)
[facility] Group Notes:  (Nursing)  Date:  10/20/2017  Time:1000 Type of Therapy:  Nurse Education  Participation Level:  Did Not Attend   Shela Nevin 10/20/2017, 5:24 PM

## 2017-10-20 NOTE — Progress Notes (Signed)
D. Pt observed in the milieu , smiling, interacting well with peers and staff- watching a baseball game on tv. Pt currently denies SI/HI and AV hallucinations. A. Labs and vitals monitored. Pt compliant with medications. Pt supported emotionally and encouraged to express concerns and ask questions.   R. Pt remains safe with 15 minute checks. Will continue POC.

## 2017-10-20 NOTE — Progress Notes (Signed)
Adult Psychoeducational Group Note  Date:  10/20/2017 Time:  9:48 PM  Group Topic/Focus:  Wrap-Up Group:   The focus of this group is to help patients review their daily goal of treatment and discuss progress on daily workbooks.  Participation Level:  Active  Participation Quality:  Appropriate  Affect:  Appropriate  Cognitive:  Appropriate  Insight: Appropriate  Engagement in Group:  Engaged  Modes of Intervention:  Discussion  Additional Comments: The patient expressed that he rates today a 10.The patient also said that he attended  all groups.  [name] 10/20/2017, 9:48 PM

## 2017-10-20 NOTE — Progress Notes (Signed)
D. Pt presents with flat affect- calm cooperative behavior- observed interacting appropriately in the milieu with peers and staff. Pt reports having slept well last night. Per pt's self inventory, pt rates his depression, hopelessness and anxiety all 0's. Pt writes that his most important goal today is "be here" and "living good".  Pt currently denies paint, SI/HI and AV hallucinations. A. Labs and vitals monitored. Pt supported emotionally and encouraged to express concerns and ask questions.   R. Pt remains safe with 15 minute checks. Will continue POC.

## 2017-10-20 NOTE — [facility] Group Notes (Signed)
  [facility] LCSW Group Therapy Note  Date/Time:  10/20/2017 11:15AM-12:00PM  Type of Therapy and Topic:  Group Therapy:  Reasons for Hospitalizations  Participation Level:  Active   Description of Group This process group involved patients discussing their thoughts about why people, in their experiences, may need to be hospitalized or may be seen by family members/people in the community as needing to be hospitalized.   Feelings related to these reasons were discussed and group members were allowed to vent their positive and negative reactions.  The group then brainstormed specific ways in which they could take care of themselves outside of the hospital in order to avoid rehospitalization.  CSW ensured that this list included such items as staying on medications, going to aftercare appointments, taking care of sleep hygiene, using a pillbox, open communication with support systems, and truthfully acknowledging symptoms to doctors, therapists, and supporters.  Therapeutic Goals 1. Patient will identify a variety of reasons that exist for people to become psychiatrically hospitalized, and will discuss why it may be necessary even though it is not desired/liked. 2. Patient will verbalize benefits of hospitalization to those who need it and ways in which those benefits can be replicated in an outpatient setting. 3. Patients will brainstorm together ways they can take care of themselves after discharge in order to avoid rehospitalization.  Summary of Patient Progress:  The patient expressed that he is in the hospital for "observation" and that people can come in the hospital to "get well."  In response to another patient's question, CSW provided psychoeducation about the diagnoses of Bipolar disorder, Schizophrenia, and Schizoaffective disorder.  This patient then stated he has never heard an explanation of mental illnesses before, and was encouraged by CSW to seek such from his providers both here in the  hospital and when he gets out of the hospital and follows up.  He left group early with pain.  Therapeutic Modalities Cognitive Behavioral Therapy Motivational Interviewing    Ambrose Mantle, LCSW 10/20/2017, 8:46 AM

## 2017-10-20 NOTE — Progress Notes (Signed)
[facility] MD Progress Note  10/20/2017 12:06 PM Marc [name]  MRN:  [id number] Subjective:  Marc [name] is a 62 y/o M with history of schizophrenia who was admitted from [facility] on IVC placed in ED with worsening symptoms of psychosis, paranoia, irritability, and delusion that he is suffering back pain due to previous employer in [place]. Pt also has stressor of recent homelessness, and poor social support. He had not been taking any medications outside the hospital. He was medically cleared and then transferred to [facility] for additional treatment and stabilization. He was started on trial of zyprexa.  Patient is seen and examined.  Patient is a 62 year old male with a past psychiatric history significant for schizophrenia who is seen in follow-up.  Nursing notes reflect from last p.m. that he had some irritability.  That seems to be improved today.  He is pleasant.  He will not sit down during the interview, but he stated that was because of his back pain.  He did not discuss his previous delusion that an employer in [place] had caused his back pain.  He denied any suicidal or homicidal ideation.  He denied any side effects to his current medications.  He remains on Zyprexa 10 mg p.o. nightly and trazodone 50 mg p.o. nightly.  He also remains on ibuprofen 600 mg p.o. every 6 hours as needed pain.  His blood pressure and respiratory rate are normal.  He has a mildly elevated temperature at 99.2.  His pulse is mildly elevated at 116.  His blood sugar was last checked on admission.  It was 155.  His white blood cell count on admission was normal.  Urinalysis on admission showed trace leukocyte Estrace positive, negative nitrate.  There were white blood cells present, but 0-5 epithelial cells.  Chest x-ray was negative.  Principal Problem: Schizophrenia (HCC) Diagnosis:   Patient Active Problem List   Diagnosis Date Noted  . Schizophrenia (HCC) [F20.9]   . EPIDURAL ABSCESS [G06.2] 01/01/2007  . DM [E11.9]  12/17/2006  . DYSPHAGIA UNSPECIFIED [R13.10] 12/17/2006  . HYPERTENSION NEC [IMO0002] 12/17/2006   Total Time spent with patient: 20 minutes  Past Psychiatric History: See admission H&P  Past Medical History:  Past Medical History:  Diagnosis Date  . Bipolar 1 disorder (HCC)   . Diabetes mellitus without complication (HCC)   . Hypertension   . Schizophrenic disorder (HCC)    History reviewed. No pertinent surgical history. Family History: History reviewed. No pertinent family history. Family Psychiatric  History: See admission H&P Social History:  Social History   Substance and Sexual Activity  Alcohol Use Yes   Comment: very little     Social History   Substance and Sexual Activity  Drug Use Never    Social History   Socioeconomic History  . Marital status: Married    Spouse name: Not on file  . Number of children: Not on file  . Years of education: Not on file  . Highest education level: Not on file  Occupational History  . Not on file  Social Needs  . Financial resource strain: Not on file  . Food insecurity:    Worry: Not on file    Inability: Not on file  . Transportation needs:    Medical: Not on file    Non-medical: Not on file  Tobacco Use  . Smoking status: Current Every Day Smoker    Packs/day: 2.00  . Smokeless tobacco: Never Used  Substance and Sexual Activity  . Alcohol use:  Yes    Comment: very little  . Drug use: Never  . Sexual activity: Not Currently  Lifestyle  . Physical activity:    Days per week: Not on file    Minutes per session: Not on file  . Stress: Not on file  Relationships  . Social connections:    Talks on phone: Not on file    Gets together: Not on file    Attends religious service: Not on file    Active member of club or organization: Not on file    Attends meetings of clubs or organizations: Not on file    Relationship status: Not on file  Other Topics Concern  . Not on file  Social History Narrative  . Not on  file   Additional Social History:                         Sleep: Good  Appetite:  Good  Current Medications: Current Facility-Administered Medications  Medication Dose Route Frequency Provider Last Rate Last Dose  . hydrOXYzine (ATARAX/VISTARIL) tablet 50 mg  50 mg Oral Q6H PRN Micheal Likens, MD   50 mg at 10/19/17 2202  . ibuprofen (ADVIL,MOTRIN) tablet 600 mg  600 mg Oral Q6H PRN Micheal Likens, MD      . LORazepam (ATIVAN) tablet 2 mg  2 mg Oral Q6H PRN Micheal Likens, MD       Or  . LORazepam (ATIVAN) injection 2 mg  2 mg Intramuscular Q6H PRN Micheal Likens, MD      . OLANZapine (ZYPREXA) tablet 10 mg  10 mg Oral QHS Micheal Likens, MD   10 mg at 10/19/17 2201  . OLANZapine zydis (ZYPREXA) disintegrating tablet 5 mg  5 mg Oral Q8H PRN Micheal Likens, MD       Or  . ziprasidone (GEODON) injection 20 mg  20 mg Intramuscular Q12H PRN Micheal Likens, MD      . traZODone (DESYREL) tablet 50 mg  50 mg Oral QHS,MR X 1 Nira Conn A, NP   50 mg at 10/19/17 2302    Lab Results: No results found for this or any previous visit (from the past 48 hour(s)).  Blood Alcohol level:  Lab Results  Component Value Date   ETH <10 10/16/2017   ETH <11 05/08/2013    Metabolic Disorder Labs: Lab Results  Component Value Date   HGBA1C (H) 08/09/2007    6.5 (NOTE)   The ADA recommends the following therapeutic goal for glycemic   control related to Hgb A1C measurement:   Goal of Therapy:   < 7.0% Hgb A1C   Reference: American Diabetes Association: Clinical Practice   Recommendations 2008, Diabetes Care,  2008, 31:(Suppl 1).   MPG 154 08/09/2007   MPG 343 10/16/2006   No results found for: PROLACTIN No results found for: CHOL, TRIG, HDL, CHOLHDL, VLDL, LDLCALC  Physical Findings: AIMS: Facial and Oral Movements Muscles of Facial Expression: None, normal Lips and Perioral Area: None, normal Jaw: None,  normal Tongue: None, normal,Extremity Movements Upper (arms, wrists, hands, fingers): None, normal Lower (legs, knees, ankles, toes): None, normal, Trunk Movements Neck, shoulders, hips: None, normal, Overall Severity Severity of abnormal movements (highest score from questions above): None, normal Incapacitation due to abnormal movements: None, normal Patient's awareness of abnormal movements (rate only patient's report): No Awareness, Dental Status Current problems with teeth and/or dentures?: No Does patient usually wear dentures?: No  CIWA:    COWS:     Musculoskeletal: Strength & Muscle Tone: within normal limits Gait & Station: normal Patient leans: N/A  Psychiatric Specialty Exam: Physical Exam  Nursing note and vitals reviewed. Constitutional: He is oriented to person, place, and time. He appears well-developed and well-nourished.  HENT:  Head: Normocephalic and atraumatic.  Respiratory: Effort normal.  Neurological: He is alert and oriented to person, place, and time.    ROS  Blood pressure 125/71, pulse (!) 116, temperature 99.2 F (37.3 C), temperature source Oral, resp. rate 16, height 5\' 10"  (1.778 m), weight 106.6 kg.Body mass index is 33.72 kg/m.  General Appearance: Casual  Eye Contact:  Fair  Speech:  Normal Rate  Volume:  Normal  Mood:  Irritable  Affect:  Congruent  Thought Process:  Coherent and Descriptions of Associations: Intact  Orientation:  Full (Time, Place, and Person)  Thought Content:  Logical  Suicidal Thoughts:  No  Homicidal Thoughts:  No  Memory:  Immediate;   Fair Recent;   Fair Remote;   Fair  Judgement:  Intact  Insight:  Fair  Psychomotor Activity:  Normal  Concentration:  Concentration: Fair and Attention Span: Fair  Recall:  [employer] of Knowledge:  Fair  Language:  Fair  Akathisia:  Negative  Handed:  Right  AIMS (if indicated):     Assets:  Desire for Improvement Housing Physical Health Resilience Social Support   ADL's:  Intact  Cognition:  WNL  Sleep:  Number of Hours: 5.5     Treatment Plan Summary: Daily contact with patient to assess and evaluate symptoms and progress in treatment, Medication management and Plan : Patient is seen and examined.  Patient is a 62 year old male with the above-stated past psychiatric history seen in follow-up.  #1 schizophrenia-continue Zyprexa 10 mg p.o. nightly.  #2 anxiety-continue Vistaril 50 mg p.o. every 6 hours as needed anxiety.  #3 agitation-continue PRN's of Ativan, Zyprexa Zydis, and Geodon on a as needed basis.  #4 insomnia-continue trazodone 50 mg p.o. nightly as needed insomnia.  #5 low-grade fever/abnormal urinalysis on admission/tachycardia-on admission he did have white blood cells in his urine, nitrate was positive for leukocyte Estrace was negative.  I am going to repeat his urinalysis just to make sure that he is not developing any form of infection that was developing on admission.  #6 disposition planning-in progress.  Antonieta Pert, MD 10/20/2017, 12:06 PM

## 2017-10-21 NOTE — Plan of Care (Signed)
Progress Note  D: pt found in bed; compliant with assessment. Pt states he slept well. Pt denies any depression/hopelessness/anxiety, rating these all 0/10. Pt denies any physical symptoms or pain, rating this a 0/10. Pt states his goal for today is nothing and will achieve this by talking. Pt denies any si/hi/ah/vh and verbally agrees to approach staff if these become apparent or before harming himself while at [facility]. A: pt provided support and encouragement. Pt given prune juice for constipation. Q21m safety checks implemented and continued.  R: pt safe on the unit. Will continue to monitor.   Pt progressing in the following metrics  Problem: Education: Goal: Knowledge of the prescribed therapeutic regimen will improve Outcome: Progressing   Problem: Activity: Goal: Interest or engagement in leisure activities will improve Outcome: Progressing Goal: Imbalance in normal sleep/wake cycle will improve Outcome: Progressing   Problem: Coping: Goal: Coping ability will improve Outcome: Progressing Goal: Will verbalize feelings Outcome: Progressing   Problem: Health Behavior/Discharge Planning: Goal: Ability to make decisions will improve Outcome: Progressing Goal: Compliance with therapeutic regimen will improve Outcome: Progressing

## 2017-10-21 NOTE — [facility] Group Notes (Signed)
[facility] LCSW Group Therapy Note  Date/Time:  10/21/2017  11:00AM-12:00PM  Type of Therapy and Topic:  Group Therapy:  Music and Mood  Participation Level:  Minimal   Description of Group: In this process group, members listened to a variety of genres of music and identified that different types of music evoke different responses.  Patients were encouraged to identify music that was soothing for them and music that was energizing for them.  Patients discussed how this knowledge can help with wellness and recovery in various ways including managing depression and anxiety as well as encouraging healthy sleep habits.    Therapeutic Goals: 1. Patients will explore the impact of different varieties of music on mood 2. Patients will verbalize the thoughts they have when listening to different types of music 3. Patients will identify music that is soothing to them as well as music that is energizing to them 4. Patients will discuss how to use this knowledge to assist in maintaining wellness and recovery 5. Patients will explore the use of music as a coping skill  Summary of Patient Progress:  At the beginning of group, patient expressed that he was feeling fine, and he participated well while present, said he enjoyed various songs that made him feel happy or relaxed.  However, he was in and out of the room frequently.  Therapeutic Modalities: Solution Focused Brief Therapy Activity   Marc Mantle, LCSW

## 2017-10-21 NOTE — Progress Notes (Signed)
Patient is resting in bed with eyes closed. Respirations even and non labored. No distress noted. Monitoring continues. 

## 2017-10-21 NOTE — Progress Notes (Signed)
D: Pt denies SI/HI/AVH. Pt is pleasant and cooperative. Pt stated he was doing about the same, pt visible on the unit this evening .   A: Pt was offered support and encouragement. Pt was given scheduled medications. Pt was encourage to attend groups. Q 15 minute checks were done for safety.   R:Pt attends groups and interacts well with peers and staff. Pt is taking medication. Pt has no complaints.Pt receptive to treatment and safety maintained on unit.   Problem: Education: Goal: Emotional status will improve Outcome: Progressing   Problem: Education: Goal: Mental status will improve Outcome: Progressing   Problem: Activity: Goal: Interest or engagement in activities will improve Outcome: Progressing   Problem: Activity: Goal: Sleeping patterns will improve Outcome: Progressing

## 2017-10-21 NOTE — [facility] Group Notes (Signed)
Pt was invited but did not attend orientation/goals group. 

## 2017-10-21 NOTE — Progress Notes (Signed)
[facility] MD Progress Note  10/21/2017 11:57 AM Marc [name]  MRN:  [id number] Subjective:  Marc [name] is a 62 y/o M with history of schizophrenia who was admitted from [facility] on IVC placed in ED with worsening symptoms of psychosis, paranoia, irritability, and delusion that he is suffering back pain due to previous employer in [place]. Pt also has stressor of recent homelessness, and poor social support. He had not been taking any medications outside the hospital. He was medically cleared and then transferred to [facility] for additional treatment and stabilization.He was started on trial of zyprexa.  Objective: Patient is seen and examined.  Patient is a 62 year old male with a past psychiatric history significant for schizophrenia who is seen in follow-up.  He is doing a little bit better this morning.  He is not as irritable.  He seems to have an improved mood.  He is pleasant.  His back pain continues.  He is not focused on his previous delusion about his employer and his back.  He denied any side effects to his current medications.  He continues on Zyprexa 10 mg p.o. nightly.  His repeat urinalysis was negative.  His repeat EKG was a normal sinus rhythm with normal QTC.  He denied any auditory or visual hallucinations.  He denied any suicidal or homicidal ideation.  He slept approximately 6 hours last night.  His blood pressure and temperature are stable.  His EKG this a.m. showed a normal sinus rhythm. Principal Problem: Schizophrenia (HCC) Diagnosis:   Patient Active Problem List   Diagnosis Date Noted  . Schizophrenia (HCC) [F20.9]   . EPIDURAL ABSCESS [G06.2] 01/01/2007  . DM [E11.9] 12/17/2006  . DYSPHAGIA UNSPECIFIED [R13.10] 12/17/2006  . HYPERTENSION NEC [IMO0002] 12/17/2006   Total Time spent with patient: 15 minutes  Past Psychiatric History: See admission H&P  Past Medical History:  Past Medical History:  Diagnosis Date  . Bipolar 1 disorder (HCC)   . Diabetes mellitus without  complication (HCC)   . Hypertension   . Schizophrenic disorder (HCC)    History reviewed. No pertinent surgical history. Family History: History reviewed. No pertinent family history. Family Psychiatric  History: See admission H&P Social History:  Social History   Substance and Sexual Activity  Alcohol Use Yes   Comment: very little     Social History   Substance and Sexual Activity  Drug Use Never    Social History   Socioeconomic History  . Marital status: Married    Spouse name: Not on file  . Number of children: Not on file  . Years of education: Not on file  . Highest education level: Not on file  Occupational History  . Not on file  Social Needs  . Financial resource strain: Not on file  . Food insecurity:    Worry: Not on file    Inability: Not on file  . Transportation needs:    Medical: Not on file    Non-medical: Not on file  Tobacco Use  . Smoking status: Current Every Day Smoker    Packs/day: 2.00  . Smokeless tobacco: Never Used  Substance and Sexual Activity  . Alcohol use: Yes    Comment: very little  . Drug use: Never  . Sexual activity: Not Currently  Lifestyle  . Physical activity:    Days per week: Not on file    Minutes per session: Not on file  . Stress: Not on file  Relationships  . Social connections:    Talks  on phone: Not on file    Gets together: Not on file    Attends religious service: Not on file    Active member of club or organization: Not on file    Attends meetings of clubs or organizations: Not on file    Relationship status: Not on file  Other Topics Concern  . Not on file  Social History Narrative  . Not on file   Additional Social History:                         Sleep: Fair  Appetite:  Fair  Current Medications: Current Facility-Administered Medications  Medication Dose Route Frequency Provider Last Rate Last Dose  . hydrOXYzine (ATARAX/VISTARIL) tablet 50 mg  50 mg Oral Q6H PRN Micheal Likens, MD   50 mg at 10/19/17 2202  . ibuprofen (ADVIL,MOTRIN) tablet 600 mg  600 mg Oral Q6H PRN Micheal Likens, MD      . LORazepam (ATIVAN) tablet 2 mg  2 mg Oral Q6H PRN Micheal Likens, MD       Or  . LORazepam (ATIVAN) injection 2 mg  2 mg Intramuscular Q6H PRN Micheal Likens, MD      . OLANZapine (ZYPREXA) tablet 10 mg  10 mg Oral QHS Micheal Likens, MD   10 mg at 10/20/17 2201  . OLANZapine zydis (ZYPREXA) disintegrating tablet 5 mg  5 mg Oral Q8H PRN Micheal Likens, MD       Or  . ziprasidone (GEODON) injection 20 mg  20 mg Intramuscular Q12H PRN Micheal Likens, MD      . traZODone (DESYREL) tablet 50 mg  50 mg Oral QHS,MR X 1 Nira Conn A, NP   50 mg at 10/20/17 2201    Lab Results:  Results for orders placed or performed during the hospital encounter of 10/17/17 (from the past 48 hour(s))  Urinalysis, Complete w Microscopic     Status: None   Collection Time: 10/20/17 12:15 PM  Result Value Ref Range   Color, Urine YELLOW YELLOW   APPearance CLEAR CLEAR   Specific Gravity, Urine 1.014 1.005 - 1.030   pH 7.0 5.0 - 8.0   Glucose, UA NEGATIVE NEGATIVE mg/dL   Hgb urine dipstick NEGATIVE NEGATIVE   Bilirubin Urine NEGATIVE NEGATIVE   Ketones, ur NEGATIVE NEGATIVE mg/dL   Protein, ur NEGATIVE NEGATIVE mg/dL   Nitrite NEGATIVE NEGATIVE   Leukocytes, UA NEGATIVE NEGATIVE   RBC / HPF 0-5 0 - 5 RBC/hpf   WBC, UA 0-5 0 - 5 WBC/hpf   Bacteria, UA NONE SEEN NONE SEEN   Squamous Epithelial / LPF 0-5 0 - 5   Mucus PRESENT     Comment: Performed at [facility], [address], [place], [place] [address]  CBC with Differential/Platelet     Status: Abnormal   Collection Time: 10/20/17  6:20 PM  Result Value Ref Range   WBC 5.5 4.0 - 10.5 K/uL   RBC 5.44 4.22 - 5.81 MIL/uL   Hemoglobin 14.0 13.0 - 17.0 g/dL   HCT 11.9 14.7 - 82.9 %   MCV 86.0 80.0 - 100.0 fL   MCH 25.7 (L) 26.0 - 34.0 pg    MCHC 29.9 (L) 30.0 - 36.0 g/dL   RDW 56.2 13.0 - 86.5 %   Platelets 166 150 - 400 K/uL   nRBC 0.0 0.0 - 0.2 %   Neutrophils Relative % 49 %  Neutro Abs 2.6 1.7 - 7.7 K/uL   Lymphocytes Relative 38 %   Lymphs Abs 2.1 0.7 - 4.0 K/uL   Monocytes Relative 11 %   Monocytes Absolute 0.6 0.1 - 1.0 K/uL   Eosinophils Relative 2 %   Eosinophils Absolute 0.1 0.0 - 0.5 K/uL   Basophils Relative 0 %   Basophils Absolute 0.0 0.0 - 0.1 K/uL   Immature Granulocytes 0 %   Abs Immature Granulocytes 0.02 0.00 - 0.07 K/uL    Comment: Performed at [facility], [address], [place], [place] [address]    Blood Alcohol level:  Lab Results  Component Value Date   [facility] <10 10/16/2017   ETH <11 05/08/2013    Metabolic Disorder Labs: Lab Results  Component Value Date   HGBA1C (H) 08/09/2007    6.5 (NOTE)   The ADA recommends the following therapeutic goal for glycemic   control related to Hgb A1C measurement:   Goal of Therapy:   < 7.0% Hgb A1C   Reference: American Diabetes Association: Clinical Practice   Recommendations 2008, Diabetes Care,  2008, 31:(Suppl 1).   MPG 154 08/09/2007   MPG 343 10/16/2006   No results found for: PROLACTIN No results found for: CHOL, TRIG, HDL, CHOLHDL, VLDL, LDLCALC  Physical Findings: AIMS: Facial and Oral Movements Muscles of Facial Expression: None, normal Lips and Perioral Area: None, normal Jaw: None, normal Tongue: None, normal,Extremity Movements Upper (arms, wrists, hands, fingers): None, normal Lower (legs, knees, ankles, toes): None, normal, Trunk Movements Neck, shoulders, hips: None, normal, Overall Severity Severity of abnormal movements (highest score from questions above): None, normal Incapacitation due to abnormal movements: None, normal Patient's awareness of abnormal movements (rate only patient's report): No Awareness, Dental Status Current problems with teeth and/or dentures?: No Does patient usually wear  dentures?: No  CIWA:    COWS:     Musculoskeletal: Strength & Muscle Tone: within normal limits Gait & Station: normal Patient leans: N/A  Psychiatric Specialty Exam: Physical Exam  Nursing note and vitals reviewed. Constitutional: He is oriented to person, place, and time. He appears well-developed and well-nourished.  HENT:  Head: Normocephalic and atraumatic.  Respiratory: Effort normal.  Neurological: He is alert and oriented to person, place, and time.    ROS  Blood pressure 125/71, pulse (!) 116, temperature 99.2 F (37.3 C), temperature source Oral, resp. rate 16, height 5\' 10"  (1.778 m), weight 106.6 kg.Body mass index is 33.72 kg/m.  General Appearance: Casual  Eye Contact:  Good  Speech:  Normal Rate  Volume:  Normal  Mood:  Euthymic  Affect:  Congruent  Thought Process:  Coherent and Descriptions of Associations: Intact  Orientation:  Full (Time, Place, and Person)  Thought Content:  Logical  Suicidal Thoughts:  No  Homicidal Thoughts:  No  Memory:  Immediate;   Fair Recent;   Fair Remote;   Fair  Judgement:  Intact  Insight:  Fair  Psychomotor Activity:  Increased  Concentration:  Concentration: Fair and Attention Span: Fair  Recall:  [employer] of Knowledge:  Fair  Language:  Fair  Akathisia:  Negative  Handed:  Right  AIMS (if indicated):     Assets:  Communication Skills Desire for Improvement Financial Resources/Insurance Housing Physical Health Resilience Social Support  ADL's:  Intact  Cognition:  WNL  Sleep:  Number of Hours: 5.5     Treatment Plan Summary: Daily contact with patient to assess and evaluate symptoms and progress in treatment,  Medication management and Plan : Patient is seen and examined.  Patient is a 62 year old male with the above-stated past psychiatric history who is seen in follow-up.  #1 schizophrenia-continue Zyprexa 10 mg p.o. nightly.  #2 anxiety-continue Vistaril 50 mg p.o. every 6 hours as needed anxiety.  #3  agitation-continue PRN's of Ativan, Zyprexa Zydis and Geodon as needed.  #4 insomnia-continue trazodone 50 mg p.o. nightly as needed insomnia.  #5 low-grade fever/abnormal urinalysis-repeat CBC was normal.  Urinalysis was normal.  #6 tachycardia-repeat EKG showed a normal sinus rhythm.  #7 disposition-in progress.  Antonieta Pert, MD 10/21/2017, 11:57 AM

## 2017-10-22 DIAGNOSIS — F17210 Nicotine dependence, cigarettes, uncomplicated: Secondary | ICD-10-CM

## 2017-10-22 MED ORDER — PALIPERIDONE ER 6 MG PO TB24
6.0000 mg | ORAL_TABLET | Freq: Every day | ORAL | Status: DC
  Administered 2017-10-22 – 2017-10-26 (×5): 6 mg via ORAL
  Filled 2017-10-22 (×8): qty 1
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
## 2017-10-22 NOTE — Progress Notes (Signed)
Recreation Therapy Notes  Date: 10.14.19 Time: 1000 Location: 500 Hall Dayroom  Group Topic: Communication  Goal Area(s) Addresses:  Patient will identify triggers. Patient will identify ways to avoid triggers. Patient will identify ways to deal with triggers head on.  Intervention: Worksheet, pencils  Activity: Triggers.  Patients were to identify their 3 biggest triggers, strategies to avoid those triggers and strategies to address triggers head on.  Education: Communication, Discharge Planning  Education Outcome: Acknowledges understanding/In group clarification offered/Needs additional education.   Clinical Observations/Feedback: Pt did not attend group.    Jurnie Garritano, LRT/CTRS         Nyriah Coote A 10/22/2017 11:34 AM 

## 2017-10-22 NOTE — Progress Notes (Signed)
[facility] MD Progress Note  10/22/2017 2:46 PM Marc [name]  MRN:  [id number] Subjective:    Marc [name] is a 62 y/o M with history of schizophrenia who was [facility] from [facility] on IVC placed in ED with worsening symptoms of psychosis, paranoia, irritability, and delusion that he is suffering back pain due to previous employer in [place]. Pt also has stressor of recent homelessness, and poor social support. He had not been taking any medications outside the hospital. He was medically cleared and then transferred to [facility] for additional treatment and stabilization. He was started on trial of zyprexa, and he had some incremental improvement of his symptoms.  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 He states, "Look at this, doc. Would I sit this way if there wasn't someone doing this to me?" Pt appears mildly leaning to left and slightly bent forward at the waist while seated, but he does not appear to be in any acute distress, and he is seen seated normally during other times of the interview. Pt reports that his symptoms are unchanged, but they do not impede on his ability to participate in the therapeutic milieu. He denies SI/HI/AH/VH. He reports that he slept well last night, and he feels that he is tolerating his medications well. Collateral information was provided by pt's son, [name], via telephone. Pt plans to stay with his son after discharge, and pt's son is in agreement with that plan, but he has concern that pt has poor adherence to his medications after discharge in the in past. We discussed option of trial of long-acting injectable medication to help with adherence, and pt's son feels that would be helpful. Pt is in agreement to trial of invega with plan to transition to  long-acting invega sustenna if pt has good tolerability and efficacy. He had no further questions, comments, or concerns.   Principal Problem: Schizophrenia (HCC) Diagnosis:   Patient Active Problem List   Diagnosis Date Noted  . Schizophrenia (HCC) [F20.9]   . EPIDURAL ABSCESS [G06.2] 01/01/2007  . DM [E11.9] 12/17/2006  . DYSPHAGIA UNSPECIFIED [R13.10] 12/17/2006  . HYPERTENSION NEC [IMO0002] 12/17/2006   Total Time spent with patient: 30 minutes  Past Psychiatric History: see H&P  Past Medical History:  Past Medical History:  Diagnosis Date  . Bipolar 1 disorder (HCC)   . Diabetes mellitus without complication (HCC)   . Hypertension   . Schizophrenic disorder (HCC)    History reviewed. No pertinent surgical history. Family History: History reviewed. No pertinent family history. Family Psychiatric  History: see H&P Social History:  Social History   Substance and Sexual Activity  Alcohol Use Yes   Comment: very little     Social History   Substance and Sexual Activity  Drug Use Never    Social History   Socioeconomic History  . Marital status: Married    Spouse name: Not on file  . Number of children: Not on file  . Years of education: Not on file  . Highest education level: Not on file  Occupational History  . Not on file  Social Needs  . Financial resource strain: Not on file  . Food insecurity:    Worry: Not on file    Inability: Not on file  . Transportation needs:    Medical: Not  on file    Non-medical: Not on file  Tobacco Use  . Smoking status: Current Every Day Smoker    Packs/day: 2.00  . Smokeless tobacco: Never Used  Substance and Sexual Activity  . Alcohol use: Yes    Comment: very little  . Drug use: Never  . Sexual activity: Not Currently  Lifestyle  . Physical activity:    Days per week: Not on file    Minutes per session: Not on file  . Stress: Not on file  Relationships  . Social connections:    Talks on phone: Not on file     Gets together: Not on file    Attends religious service: Not on file    Active member of club or organization: Not on file    Attends meetings of clubs or organizations: Not on file    Relationship status: Not on file  Other Topics Concern  . Not on file  Social History Narrative  . Not on file   Additional Social History:                         Sleep: Good  Appetite:  Good  Current Medications: Current Facility-Administered Medications  Medication Dose Route Frequency Provider Last Rate Last Dose  . hydrOXYzine (ATARAX/VISTARIL) tablet 50 mg  50 mg Oral Q6H PRN Micheal Likens, MD   50 mg at 10/21/17 2129  . ibuprofen (ADVIL,MOTRIN) tablet 600 mg  600 mg Oral Q6H PRN Micheal Likens, MD      . LORazepam (ATIVAN) tablet 2 mg  2 mg Oral Q6H PRN Micheal Likens, MD   2 mg at 10/21/17 2325   Or  . LORazepam (ATIVAN) injection 2 mg  2 mg Intramuscular Q6H PRN Micheal Likens, MD      . OLANZapine zydis (ZYPREXA) disintegrating tablet 5 mg  5 mg Oral Q8H PRN Micheal Likens, MD       Or  . ziprasidone (GEODON) injection 20 mg  20 mg Intramuscular Q12H PRN Micheal Likens, MD      . paliperidone (INVEGA) 24 hr tablet 6 mg  6 mg Oral Daily Micheal Likens, MD   6 mg at 10/22/17 1203  . traZODone (DESYREL) tablet 50 mg  50 mg Oral QHS,MR X 1 Nira Conn A, NP   50 mg at 10/21/17 2203    Lab Results:  Results for orders placed or performed during the hospital encounter of 10/17/17 (from the past 48 hour(s))  CBC with Differential/Platelet     Status: Abnormal   Collection Time: 10/20/17  6:20 PM  Result Value Ref Range   WBC 5.5 4.0 - 10.5 K/uL   RBC 5.44 4.22 - 5.81 MIL/uL   Hemoglobin 14.0 13.0 - 17.0 g/dL   HCT 19.1 47.8 - 29.5 %   MCV 86.0 80.0 - 100.0 fL   MCH 25.7 (L) 26.0 - 34.0 pg   MCHC 29.9 (L) 30.0 - 36.0 g/dL   RDW 62.1 30.8 - 65.7 %   Platelets 166 150 - 400 K/uL   nRBC 0.0 0.0 - 0.2 %    Neutrophils Relative % 49 %   Neutro Abs 2.6 1.7 - 7.7 K/uL   Lymphocytes Relative 38 %   Lymphs Abs 2.1 0.7 - 4.0 K/uL   Monocytes Relative 11 %   Monocytes Absolute 0.6 0.1 - 1.0 K/uL   Eosinophils Relative 2 %   Eosinophils Absolute 0.1 0.0 -  0.5 K/uL   Basophils Relative 0 %   Basophils Absolute 0.0 0.0 - 0.1 K/uL   Immature Granulocytes 0 %   Abs Immature Granulocytes 0.02 0.00 - 0.07 K/uL    Comment: Performed at [facility], [address], [place], [place] [address]    Blood Alcohol level:  Lab Results  Component Value Date   [facility] <10 10/16/2017   ETH <11 05/08/2013    Metabolic Disorder Labs: Lab Results  Component Value Date   HGBA1C (H) 08/09/2007    6.5 (NOTE)   The ADA recommends the following therapeutic goal for glycemic   control related to Hgb A1C measurement:   Goal of Therapy:   < 7.0% Hgb A1C   Reference: American Diabetes Association: Clinical Practice   Recommendations 2008, Diabetes Care,  2008, 31:(Suppl 1).   MPG 154 08/09/2007   MPG 343 10/16/2006   No results found for: PROLACTIN No results found for: CHOL, TRIG, HDL, CHOLHDL, VLDL, LDLCALC  Physical Findings: AIMS: Facial and Oral Movements Muscles of Facial Expression: None, normal Lips and Perioral Area: None, normal Jaw: None, normal Tongue: None, normal,Extremity Movements Upper (arms, wrists, hands, fingers): None, normal Lower (legs, knees, ankles, toes): None, normal, Trunk Movements Neck, shoulders, hips: None, normal, Overall Severity Severity of abnormal movements (highest score from questions above): None, normal Incapacitation due to abnormal movements: None, normal Patient's awareness of abnormal movements (rate only patient's report): No Awareness, Dental Status Current problems with teeth and/or dentures?: No Does patient usually wear dentures?: No  CIWA:    COWS:     Musculoskeletal: Strength & Muscle Tone: within normal limits Gait & Station:  normal Patient leans: N/A  Psychiatric Specialty Exam: Physical Exam  Nursing note and vitals reviewed.   Review of Systems  Constitutional: Negative for chills and fever.  Respiratory: Negative for cough and shortness of breath.   Cardiovascular: Negative for chest pain.  Gastrointestinal: Negative for abdominal pain, heartburn, nausea and vomiting.  Psychiatric/Behavioral: Negative for depression, hallucinations and suicidal ideas. The patient is not nervous/anxious and does not have insomnia.     Blood pressure 125/71, pulse (!) 116, temperature 99.2 F (37.3 C), temperature source Oral, resp. rate 16, height 5\' 10"  (1.778 m), weight 106.6 kg.Body mass index is 33.72 kg/m.  General Appearance: Casual and Disheveled  Eye Contact:  Good  Speech:  Clear and Coherent and Normal Rate  Volume:  Normal  Mood:  Euthymic and Irritable  Affect:  Appropriate, Blunt and Congruent  Thought Process:  Coherent, Goal Directed and Descriptions of Associations: Loose  Orientation:  Full (Time, Place, and Person)  Thought Content:  Delusions, Ideas of Reference:   Paranoia Delusions and Paranoid Ideation  Suicidal Thoughts:  No  Homicidal Thoughts:  No  Memory:  Immediate;   Fair Recent;   Fair Remote;   Fair  Judgement:  Poor  Insight:  Lacking  Psychomotor Activity:  Normal  Concentration:  Concentration: Fair  Recall:  [employer] of Knowledge:  Fair  Language:  Fair  Akathisia:  No  Handed:    AIMS (if indicated):     Assets:  Communication Skills Desire for Improvement Housing Resilience Social Support  ADL's:  Intact  Cognition:  WNL  Sleep:  Number of Hours: 4.75     Treatment Plan Summary: Daily contact with patient to assess and evaluate symptoms and progress in treatment and Medication management    -Continue inpatient hospitalization  -Schizophrenia             -  DC  zyprexa   -Start Invega 6mg  po qDay  -anxiety             -Continue vistaril 50mg  po q6h  prn anxiety  -agitation                      -Continue ativan 2mg  po/IM q6h prn anxiety             -Continue zydis 5mg  po q8h prn agitation              -Continue geodon 20mg  IM q12h prn severe agitation  -insomnia             -Continue trazodone 50mg  po qhs prn insomnia  -Encourage participation in groups and therapeutic milieu  -disposition planning will be ongoing    Micheal Likens, MD 10/22/2017, 2:46 PM

## 2017-10-22 NOTE — Progress Notes (Signed)
CSW spoke to pt son, Marc [name], who reports he is hesitant to take pt into his home due to past problems.  Marc [name] has been by to see pt and has been speaking to him on the phone and reports pt does seem to be doing better, however, this has happened in the past and once out of the hospital, pt stops taking his meds and things fall apart.  He had tried to get pt set up in an apartment before pt moved out of his last place and pt changed his mind at the last minute and wouldn't go.  CSW talked with him that pt is improved here at [facility] and ready for discharge from psychiatric standpoint.  Marc [name] to call his mother, pt ex-wife, and discuss what they can do.  CSW spoke to [facility] [place].  Pt has been seen in their mental health clinic most recently February 2019.  Today is holiday and no one is in the scheduling office.  Will need to call back tomorrow to make appt. Garner Nash, MSW, LCSW Clinical Social Worker 10/22/2017 9:15 AM

## 2017-10-22 NOTE — Progress Notes (Signed)
D: Pt denies SI/HI/AVH. Pt is pleasant and cooperative. Pt visible on the dayroom , pt seen on the unit interacting with peers sometimes, pt has flat affect, but will brighten up when talking about things in his past.   A: Pt was offered support and encouragement. Pt was given scheduled medications. Pt was encourage to attend groups. Q 15 minute checks were done for safety.   R:Pt attends groups and interacts well with peers and staff. Pt is taking medication. Pt has no complaints.Pt receptive to treatment and safety maintained on unit.  Problem: Education: Goal: Mental status will improve Outcome: Progressing   Problem: Education: Goal: Verbalization of understanding the information provided will improve Outcome: Progressing   Problem: Activity: Goal: Interest or engagement in activities will improve Outcome: Progressing   Problem: Activity: Goal: Sleeping patterns will improve Outcome: Progressing   Problem: Health Behavior/Discharge Planning: Goal: Compliance with treatment plan for underlying cause of condition will improve Outcome: Progressing

## 2017-10-23 MED ORDER — PALIPERIDONE PALMITATE ER 156 MG/ML IM SUSY
156.0000 mg | PREFILLED_SYRINGE | INTRAMUSCULAR | Status: DC
  Administered 2017-10-26: 156 mg via INTRAMUSCULAR
  Filled 2017-10-23: qty 1

## 2017-10-23 MED ORDER — PALIPERIDONE PALMITATE ER 234 MG/1.5ML IM SUSY
234.0000 mg | PREFILLED_SYRINGE | Freq: Once | INTRAMUSCULAR | Status: AC
  Administered 2017-10-23: 234 mg via INTRAMUSCULAR
  Filled 2017-10-23: qty 1.5

## 2017-10-23 NOTE — Progress Notes (Signed)
Recreation Therapy Notes  Date: 10.15.19 Time: 1000 Location: 500 Hall Dayroom  Group Topic: Wellness  Goal Area(s) Addresses:  Patient will define components of whole wellness. Patient will verbalize benefit of whole wellness.  Intervention: Music, Exercise  Activity: Exercise.  LRT led patients in a series of stretches to get them warmed up and loose.  Each patient would then lead the group in an exercise of their choice.  The group will complete 3 to 4 rounds of exercise.  Patients could take water breaks as needed.  Education: Wellness, Building control surveyor.   Education Outcome: Acknowledges education/In group clarification offered/Needs additional education.   Clinical Observations/Feedback: Pt did not attend group.    Caroll Rancher, LRT/CTRS         Lillia Abed, Bitania Shankland A 10/23/2017 11:35 AM

## 2017-10-23 NOTE — Progress Notes (Signed)
D: Pt denies SI/HI/AVH. Pt is pleasant and cooperative. Pt stated he planned to stay with son on D/C . Pt visible on unit interacting with peers appropriately this evening A: Pt was offered support and encouragement. Pt was given scheduled medications. Pt was encourage to attend groups. Q 15 minute checks were done for safety.  R:Pt attends groups and interacts well with peers and staff. Pt is taking medication. Pt has no complaints.Pt receptive to treatment and safety maintained on unit.  Problem: Education: Goal: Emotional status will improve Outcome: Progressing   Problem: Education: Goal: Mental status will improve Outcome: Progressing   Problem: Education: Goal: Verbalization of understanding the information provided will improve Outcome: Progressing   Problem: Activity: Goal: Interest or engagement in activities will improve Outcome: Progressing   Problem: Coping: Goal: Ability to verbalize frustrations and anger appropriately will improve Outcome: Progressing   Problem: Coping: Goal: Ability to demonstrate self-control will improve Outcome: Progressing   Problem: Safety: Goal: Periods of time without injury will increase Outcome: Progressing

## 2017-10-23 NOTE — Progress Notes (Signed)
Adult Psychoeducational Group Note  Date:  10/23/2017 Time:  8:39 PM  Group Topic/Focus:  Wrap-Up Group:   The focus of this group is to help patients review their daily goal of treatment and discuss progress on daily workbooks.  Participation Level:  Active  Participation Quality:  Appropriate  Affect:  Appropriate  Cognitive:  Appropriate  Insight: Appropriate  Engagement in Group:  Engaged  Modes of Intervention:  Discussion  Additional Comments:  Patient attended group and said his day was a 9.  His goal for today was to discuss his discharge plan and he did.   Patient said he will be discharge tomorrow.  Eliceo Gladu W Ayrabella Labombard 10/23/2017, 8:39 PM

## 2017-10-23 NOTE — Plan of Care (Signed)
  Problem: Activity: Goal: Interest or engagement in activities will improve Outcome: Progressing   Problem: Safety: Goal: Periods of time without injury will increase Outcome: Progressing  DAR NOTE: Patient presents with anxious affect and mood.  Denies suicidal thoughts, auditory and visual hallucinations.  Described energy level as normal and concentration as good.  Rates depression at 0, hopelessness at 0, and anxiety at 0.  Maintained on routine safety checks.  Medications given as prescribed.  Support and encouragement offered as needed.  Attended group and participated.  Patient observed socializing with peers in the dayroom.  Offered no complaint.  Requested and received Motrin 600 mg for complain of back pain with good effect.  Patient is safe on and off the unit.

## 2017-10-23 NOTE — Progress Notes (Signed)
[facility] MD Progress Note  10/23/2017 2:03 PM Marc [name]  MRN:  [id number] Subjective:    Marc [name] is a 62 y/o M with history of schizophrenia who was admitted from [facility] on IVC placed in ED with worsening symptoms of psychosis, paranoia, irritability, and delusion that he is suffering back pain due to previous employer in [place]. Pt also has stressor of recent homelessness, and poor social support. He had not been taking any medications outside the hospital. He was medically cleared and then transferred to [facility] for additional treatment and stabilization.He was started on trial of zyprexa, and he had some incremental improvement of his symptoms, but he continued to report paranoia and delusions, and his son had expressed concerns about pt's previous history of poor adherence after discharge, so pt was transitioned to [place] oral form with tentative plan to transition to long-acting injectable form of [place].  Today upon evaluation, pt shares, "I'm okay. 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 Pt denies other concerns today. He is sleeping well. His appetite is good. He denies SI/HI/AH/VH. He is tolerating Invega without difficulty or side effects, and we discussed transitioning to long-acting injectable form as per our tentative plan, and pt was in agreement. Pt had no further questions, comments, or concerns.  Principal Problem: Schizophrenia (HCC) Diagnosis:   Patient Active Problem List   Diagnosis Date Noted  . Schizophrenia (HCC) [F20.9]   . EPIDURAL ABSCESS [G06.2] 01/01/2007  . DM [E11.9] 12/17/2006  . DYSPHAGIA UNSPECIFIED [R13.10] 12/17/2006  . HYPERTENSION NEC [IMO0002] 12/17/2006   Total Time spent with patient: 30 minutes  Past Psychiatric History: see H&P  Past Medical History:  Past Medical  History:  Diagnosis Date  . Bipolar 1 disorder (HCC)   . Diabetes mellitus without complication (HCC)   . Hypertension   . Schizophrenic disorder (HCC)    History reviewed. No pertinent surgical history. Family History: History reviewed. No pertinent family history. Family Psychiatric  History: see H&P Social History:  Social History   Substance and Sexual Activity  Alcohol Use Yes   Comment: very little     Social History   Substance and Sexual Activity  Drug Use Never    Social History   Socioeconomic History  . Marital status: Married    Spouse name: Not on file  . Number of children: Not on file  . Years of education: Not on file  . Highest education level: Not on file  Occupational History  . Not on file  Social Needs  . Financial resource strain: Not on file  . Food insecurity:    Worry: Not on file    Inability: Not on file  . Transportation needs:    Medical: Not on file    Non-medical: Not on file  Tobacco Use  . Smoking status: Current Every Day Smoker    Packs/day: 2.00  . Smokeless tobacco: Never Used  Substance and Sexual Activity  . Alcohol use: Yes    Comment: very little  . Drug use: Never  . Sexual activity: Not Currently  Lifestyle  . Physical activity:    Days per week: Not on file    Minutes per session: Not on file  . Stress: Not on file  Relationships  . Social connections:    Talks on phone: Not on file    Gets together:  Not on file    Attends religious service: Not on file    Active member of club or organization: Not on file    Attends meetings of clubs or organizations: Not on file    Relationship status: Not on file  Other Topics Concern  . Not on file  Social History Narrative  . Not on file   Additional Social History:                         Sleep: Good  Appetite:  Good  Current Medications: Current Facility-Administered Medications  Medication Dose Route Frequency Provider Last Rate Last Dose  .  hydrOXYzine (ATARAX/VISTARIL) tablet 50 mg  50 mg Oral Q6H PRN Micheal Likens, MD   50 mg at 10/22/17 2218  . ibuprofen (ADVIL,MOTRIN) tablet 600 mg  600 mg Oral Q6H PRN Micheal Likens, MD   600 mg at 10/23/17 1051  . LORazepam (ATIVAN) tablet 2 mg  2 mg Oral Q6H PRN Micheal Likens, MD   2 mg at 10/21/17 2325   Or  . LORazepam (ATIVAN) injection 2 mg  2 mg Intramuscular Q6H PRN Micheal Likens, MD      . OLANZapine zydis (ZYPREXA) disintegrating tablet 5 mg  5 mg Oral Q8H PRN Micheal Likens, MD       Or  . ziprasidone (GEODON) injection 20 mg  20 mg Intramuscular Q12H PRN Micheal Likens, MD      . Melene Muller ON 10/26/2017] paliperidone (INVEGA SUSTENNA) injection 156 mg  156 mg Intramuscular Q30 days Jolyne Loa T, MD      . paliperidone (INVEGA) 24 hr tablet 6 mg  6 mg Oral Daily Micheal Likens, MD   6 mg at 10/23/17 0801  . traZODone (DESYREL) tablet 50 mg  50 mg Oral QHS,MR X 1 Nira Conn A, NP   50 mg at 10/22/17 2218    Lab Results: No results found for this or any previous visit (from the past 48 hour(s)).  Blood Alcohol level:  Lab Results  Component Value Date   ETH <10 10/16/2017   ETH <11 05/08/2013    Metabolic Disorder Labs: Lab Results  Component Value Date   HGBA1C (H) 08/09/2007    6.5 (NOTE)   The ADA recommends the following therapeutic goal for glycemic   control related to Hgb A1C measurement:   Goal of Therapy:   < 7.0% Hgb A1C   Reference: American Diabetes Association: Clinical Practice   Recommendations 2008, Diabetes Care,  2008, 31:(Suppl 1).   MPG 154 08/09/2007   MPG 343 10/16/2006   No results found for: PROLACTIN No results found for: CHOL, TRIG, HDL, CHOLHDL, VLDL, LDLCALC  Physical Findings: AIMS: Facial and Oral Movements Muscles of Facial Expression: None, normal Lips and Perioral Area: None, normal Jaw: None, normal Tongue: None, normal,Extremity Movements Upper  (arms, wrists, hands, fingers): None, normal Lower (legs, knees, ankles, toes): None, normal, Trunk Movements Neck, shoulders, hips: None, normal, Overall Severity Severity of abnormal movements (highest score from questions above): None, normal Incapacitation due to abnormal movements: None, normal Patient's awareness of abnormal movements (rate only patient's report): No Awareness, Dental Status Current problems with teeth and/or dentures?: No Does patient usually wear dentures?: No  CIWA:    COWS:     Musculoskeletal: Strength & Muscle Tone: within normal limits Gait & Station: normal Patient leans: N/A  Psychiatric Specialty Exam: Physical Exam  Nursing note and vitals  reviewed.   Review of Systems  Constitutional: Negative for chills and fever.  Respiratory: Negative for cough and shortness of breath.   Cardiovascular: Negative for chest pain.  Gastrointestinal: Negative for abdominal pain, heartburn, nausea and vomiting.  Psychiatric/Behavioral: Negative for depression, hallucinations and suicidal ideas. The patient is not nervous/anxious and does not have insomnia.     Blood pressure (!) 139/91, pulse (!) 105, temperature 99.3 F (37.4 C), temperature source Oral, resp. rate 20, height 5\' 10"  (1.778 m), weight 106.6 kg.Body mass index is 33.72 kg/m.  General Appearance: Casual and Fairly Groomed  Eye Contact:  Good  Speech:  Clear and Coherent and Normal Rate  Volume:  Increased  Mood:  Euthymic  Affect:  Appropriate and Congruent  Thought Process:  Coherent and Goal Directed  Orientation:  Full (Time, Place, and Person)  Thought Content:  Delusions and Paranoid Ideation  Suicidal Thoughts:  No  Homicidal Thoughts:  No  Memory:  Immediate;   Fair Recent;   Fair Remote;   Fair  Judgement:  Fair  Insight:  Lacking  Psychomotor Activity:  Normal  Concentration:  Concentration: Fair  Recall:  [employer] of Knowledge:  Fair  Language:  Fair  Akathisia:  No   Handed:    AIMS (if indicated):     Assets:  Resilience Social Support  ADL's:  Intact  Cognition:  WNL  Sleep:  Number of Hours: 6   Treatment Plan Summary: Daily contact with patient to assess and evaluate symptoms and progress in treatment and Medication management   -Continue inpatient hospitalization  -Schizophrenia -Start [place]. Administer Sustenna 234mg  IM once today followed by [place] 156mg  IM q30 days starting on 10/26/17             -Continue Invega 6mg  po qDay  -anxiety -Continue vistaril 50mg  po q6h prn anxiety  -agitation  -Continue ativan 2mg  po/IM q6h prn anxiety -Continue zydis 5mg  po q8h prn agitation  -Continue geodon 20mg  IM q12h prn severe agitation  -insomnia -Continue trazodone 50mg  po qhs prn insomnia  -Encourage participation in groups and therapeutic milieu  -disposition planning will be ongoing  Micheal Likens, MD 10/23/2017, 2:03 PM

## 2017-10-24 DIAGNOSIS — F200 Paranoid schizophrenia: Secondary | ICD-10-CM

## 2017-10-24 NOTE — Progress Notes (Signed)
Recreation Therapy Notes  Date: 10.16.19 Time: 1000 Location: 500 Hall Dayroom  Group Topic: Communication  Goal Area(s) Addresses:  Patient will effectively communicate with peers in group.  Patient will verbalize benefit of healthy communication. Patient will verbalize positive effect of healthy communication on post d/c goals.   Behavioral Response: Engaged  Intervention: Merchandiser, retail, pencils, blank paper  Activity: Back to Back Drawings.  Patients were paired off into groups of two. Patients are seated back to back.  One person in each group was given a picture to describe to their partner.  The partner is to draw the picture as it is described to them.  The person drawing could not ask any questions of the person giving the instructions.  Once finished, partners will compare pictures and then switch roles.  LRT will then give each group a different picture.  Education: Communication, Discharge Planning  Education Outcome: Acknowledges understanding/In group clarification offered/Needs additional education.   Clinical Observations/Feedback:  Pt was engaged, social and comical.  Pt stated he had a hard time describing the picture to his partner.  Pt expressed he could have done a better job if he had studied the picture more.  Pt explained that his partner was "way better than I was" at giving instructions.  Pt also admitted he was not good at following instructions and that he does what he wants to do.    Caroll Rancher, LRT/CTRS    Caroll Rancher A 10/24/2017 12:10 PM

## 2017-10-24 NOTE — Progress Notes (Signed)
Adult Psychoeducational Group Note  Date:  10/24/2017 Time:  8:53 PM  Group Topic/Focus:  Wrap-Up Group:   The focus of this group is to help patients review their daily goal of treatment and discuss progress on daily workbooks.  Participation Level:  Active  Participation Quality:  Appropriate  Affect:  Appropriate  Cognitive:  Appropriate  Insight: Appropriate  Engagement in Group:  Engaged  Modes of Intervention:  Discussion  Additional Comments: The patient expressed that he rates today a 10.The patient also said that he attended group.  [name] 10/24/2017, 8:53 PM

## 2017-10-24 NOTE — Progress Notes (Signed)
[facility] MD Progress Note  10/24/2017 1:12 PM Marc [name]  MRN:  [id number]  Subjective: Marc [name] reports, "I have been in this hospital x 1 week. Nothing serious was going when I came here & nothing serious is going on now. I'm doing great".   Marc [name] is a 62 y/o M with history of schizophrenia who was admitted from [facility] on IVC placed in ED with worsening symptoms of psychosis, paranoia, irritability, and delusion that he is suffering back pain due to previous employer in [place]. Pt also has stressor of recent homelessness, and poor social support. He had not been taking any medications outside the hospital. He was medically cleared and then transferred to [facility] for additional treatment and stabilization.He was started on trial of zyprexa, and he had some incremental improvement of his symptoms, but he continued to report paranoia and delusions, and his son had expressed concerns about pt's previous history of poor adherence after discharge, so pt was transitioned to [place] oral form with tentative plan to transition to long-acting injectable form of [place].  Marc [name] is seen, chart reviewed. The chart findings discussed with the treatment team. Today upon evaluation, Marc [name] presents alert. He is lying down in his bed. He is making good eye contact. He says he is doing great today. He denies any specific complaints. 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 Pt denies any other concerns today. He is sleeping well. His appetite is good. He denies SI/HI/AH/VH. He is tolerating Invega without difficulty or side effects. He had a discussion with the attending psychiatrist yesterday about transitioning to long-acting injectable form as per our tentative plan, z and pt was in agreement. He received his first dose of [place] IM yesterday, the next dose is due on 10-26-17. He has  agreed to continue current plan of care as already in progress.  Pt had no further questions, comments, or concerns.  Principal Problem: Schizophrenia (HCC)  Diagnosis:   Patient Active Problem List   Diagnosis Date Noted  . Schizophrenia (HCC) [F20.9]   . EPIDURAL ABSCESS [G06.2] 01/01/2007  . DM [E11.9] 12/17/2006  . DYSPHAGIA UNSPECIFIED [R13.10] 12/17/2006  . HYPERTENSION NEC [IMO0002] 12/17/2006   Total Time spent with patient: 15 minutes  Past Psychiatric History: See H&P  Past Medical History:  Past Medical History:  Diagnosis Date  . Bipolar 1 disorder (HCC)   . Diabetes mellitus without complication (HCC)   . Hypertension   . Schizophrenic disorder (HCC)    History reviewed. No pertinent surgical history.  Family History: History reviewed. No pertinent family history.  Family Psychiatric  History: See H&P  Social History:  Social History   Substance and Sexual Activity  Alcohol Use Yes   Comment: very little     Social History   Substance and Sexual Activity  Drug Use Never    Social History   Socioeconomic History  . Marital status: Married    Spouse name: Not on file  . Number of children: Not on file  . Years of education: Not on file  . Highest education level: Not on file  Occupational History  . Not on file  Social Needs  . Financial resource strain: Not on file  . Food insecurity:    Worry: Not on file    Inability: Not on file  . Transportation needs:    Medical: Not on file    Non-medical: Not on  file  Tobacco Use  . Smoking status: Current Every Day Smoker    Packs/day: 2.00  . Smokeless tobacco: Never Used  Substance and Sexual Activity  . Alcohol use: Yes    Comment: very little  . Drug use: Never  . Sexual activity: Not Currently  Lifestyle  . Physical activity:    Days per week: Not on file    Minutes per session: Not on file  . Stress: Not on file  Relationships  . Social connections:    Talks on phone: Not on file     Gets together: Not on file    Attends religious service: Not on file    Active member of club or organization: Not on file    Attends meetings of clubs or organizations: Not on file    Relationship status: Not on file  Other Topics Concern  . Not on file  Social History Narrative  . Not on file   Additional Social History:   Sleep: Good  Appetite:  Good  Current Medications: Current Facility-Administered Medications  Medication Dose Route Frequency Provider Last Rate Last Dose  . hydrOXYzine (ATARAX/VISTARIL) tablet 50 mg  50 mg Oral Q6H PRN Micheal Likens, MD   50 mg at 10/23/17 2052  . ibuprofen (ADVIL,MOTRIN) tablet 600 mg  600 mg Oral Q6H PRN Micheal Likens, MD   600 mg at 10/23/17 1051  . LORazepam (ATIVAN) tablet 2 mg  2 mg Oral Q6H PRN Micheal Likens, MD   2 mg at 10/23/17 2203   Or  . LORazepam (ATIVAN) injection 2 mg  2 mg Intramuscular Q6H PRN Micheal Likens, MD      . OLANZapine zydis (ZYPREXA) disintegrating tablet 5 mg  5 mg Oral Q8H PRN Micheal Likens, MD       Or  . ziprasidone (GEODON) injection 20 mg  20 mg Intramuscular Q12H PRN Micheal Likens, MD      . Melene Muller ON 10/26/2017] paliperidone (INVEGA SUSTENNA) injection 156 mg  156 mg Intramuscular Q30 days Jolyne Loa T, MD      . paliperidone (INVEGA) 24 hr tablet 6 mg  6 mg Oral Daily Micheal Likens, MD   6 mg at 10/24/17 0720  . traZODone (DESYREL) tablet 50 mg  50 mg Oral QHS,MR X 1 Nira Conn A, NP   50 mg at 10/23/17 2203   Lab Results: No results found for this or any previous visit (from the past 48 hour(s)).  Blood Alcohol level:  Lab Results  Component Value Date   ETH <10 10/16/2017   ETH <11 05/08/2013   Metabolic Disorder Labs: Lab Results  Component Value Date   HGBA1C (H) 08/09/2007    6.5 (NOTE)   The ADA recommends the following therapeutic goal for glycemic   control related to Hgb A1C measurement:   Goal of  Therapy:   < 7.0% Hgb A1C   Reference: American Diabetes Association: Clinical Practice   Recommendations 2008, Diabetes Care,  2008, 31:(Suppl 1).   MPG 154 08/09/2007   MPG 343 10/16/2006   No results found for: PROLACTIN No results found for: CHOL, TRIG, HDL, CHOLHDL, VLDL, LDLCALC  Physical Findings: AIMS: Facial and Oral Movements Muscles of Facial Expression: None, normal Lips and Perioral Area: None, normal Jaw: None, normal Tongue: None, normal,Extremity Movements Upper (arms, wrists, hands, fingers): None, normal Lower (legs, knees, ankles, toes): None, normal, Trunk Movements Neck, shoulders, hips: None, normal, Overall Severity Severity of abnormal movements (  highest score from questions above): None, normal Incapacitation due to abnormal movements: None, normal Patient's awareness of abnormal movements (rate only patient's report): No Awareness, Dental Status Current problems with teeth and/or dentures?: No Does patient usually wear dentures?: No  CIWA:    COWS:     Musculoskeletal: Strength & Muscle Tone: within normal limits Gait & Station: normal Patient leans: N/A  Psychiatric Specialty Exam: Physical Exam  Nursing note and vitals reviewed.   Review of Systems  Constitutional: Negative for chills and fever.  Respiratory: Negative for cough and shortness of breath.   Cardiovascular: Negative for chest pain.  Gastrointestinal: Negative for abdominal pain, heartburn, nausea and vomiting.  Psychiatric/Behavioral: Negative for depression, hallucinations and suicidal ideas. The patient is not nervous/anxious and does not have insomnia.     Blood pressure (!) 158/97, pulse (!) 106, temperature 98.2 F (36.8 C), temperature source Oral, resp. rate 20, height 5\' 10"  (1.778 m), weight 106.6 kg.Body mass index is 33.72 kg/m.  General Appearance: Casual and Fairly Groomed  Eye Contact:  Good  Speech:  Clear and Coherent and Normal Rate  Volume:  Increased  Mood:   Euthymic  Affect:  Appropriate and Congruent  Thought Process:  Coherent and Goal Directed  Orientation:  Full (Time, Place, and Person)  Thought Content:  Delusions and Paranoid Ideation  Suicidal Thoughts:  No  Homicidal Thoughts:  No  Memory:  Immediate;   Fair Recent;   Fair Remote;   Fair  Judgement:  Fair  Insight:  Lacking  Psychomotor Activity:  Normal  Concentration:  Concentration: Fair  Recall:  [employer] of Knowledge:  Fair  Language:  Fair  Akathisia:  No  Handed:    AIMS (if indicated):     Assets:  Resilience Social Support  ADL's:  Intact  Cognition:  WNL  Sleep:  Number of Hours: 6   Treatment Plan Summary: Daily contact with patient to assess and evaluate symptoms and progress in treatment and Medication management   -Continue inpatient hospitalization.  -Will continue today 10/24/2017 plan as below except where it is noted.  -Schizophrenia -Continue Gean [place]. Administered Sustenna 234 mg IM once yesterday followed by Lorelei Pont 156mg  IM q30 days starting on 10/26/17             -Continue Invega 6mg  po qDay  -anxiety -Continue vistaril 50mg  po q6h prn anxiety  -agitation  -Continue ativan 2mg  po/IM q6h prn anxiety -Continue zydis 5mg  po q8h prn agitation  -Continue geodon 20mg  IM q12h prn severe agitation  -insomnia -Continue trazodone 50mg  po qhs prn insomnia  -Encourage participation in groups and therapeutic milieu  -disposition planning will be ongoing  Armandina Stammer, NP, PMHNP, FNP-BC. 10/24/2017, 1:12 PMPatient ID: Susy Manor, male   DOB: March 30, 1955, 62 y.o.   MRN: [id number]

## 2017-10-24 NOTE — [facility] Group Notes (Addendum)
LCSW Group Therapy Note   10/24/2017 1:15pm   Type of Therapy and Topic:  Group Therapy:  Positive Affirmations   Participation Level:  Minimal  Description of Group: This group addressed positive affirmation toward self and others. Patients went around the room and identified two positive things about themselves and two positive things about a peer in the room. Patients reflected on how it felt to share something positive with others, to identify positive things about themselves, and to hear positive things from others. Patients were encouraged to have a daily reflection of positive characteristics or circumstances.  Therapeutic Goals 1. Patient will verbalize two of their positive qualities 2. Patient will demonstrate empathy for others by stating two positive qualities about a peer in the group 3. Patient will verbalize their feelings when voicing positive self affirmations and when voicing positive affirmations of others 4. Patients will discuss the potential positive impact on their wellness/recovery of focusing on positive traits of self and others. Summary of Patient Progress:  "I don't let things stress me out.  I guess I listen to a lot of music.  Stress is just another owrd."  Wandered in and out several times.  Difficult time sitting still.  "I don't believe the Dr will send me out on Friday.  You watch it turn into the weekend." Shared his   Therapeutic Modalities Cognitive Behavioral Therapy Motivational Interviewing  Ida Rogue, [place] 10/24/2017 11:13 AM

## 2017-10-25 NOTE — Progress Notes (Signed)
Recreation Therapy Notes  Date: 10.17.19 Time: 1000 Location: 500 Hall Dayroom  Group Topic: Stress Management  Goal Area(s) Addresses:  Patient will verbalize importance of using healthy stress management.  Patient will identify positive emotions associated with healthy stress management.   Behavioral Response: Engaged  Intervention: Stress Management  Activity :  Deep Breathing & Guided Imagery.  LRT introduced the stress management techniques of deep breathing and guided imagery.  LRT led patients in the proper way to practice deep breathing.  LRT then played a guided imagery that allowed patients to further relax on the beach at sunset.  Education:  Stress Management, Discharge Planning.   Education Outcome: Acknowledges edcuation/In group clarification offered/Needs additional education  Clinical Observations/Feedback: Pt expressed stress can be caused by lack of finances or not having a place to stay.  Pt also stated stress can show in the body as pain.  During the activity, pt actively participated.  Pt and engaged in the deep breathing.  Pt also appeared very relaxed during the guided imagery.  Pt appeared to fall asleep but unable to get completely comfortable.      Caroll Rancher, LRT/CTRS     Lillia Abed, Talbert Trembath A 10/25/2017 10:39 AM

## 2017-10-25 NOTE — Progress Notes (Signed)
D    Pt is cooperative and pleasant on approach   He tends to isolate and did not attend group this evening but did come get his HS medication without prompting A   Verbal support and encouragement given   Medications administered and effectiveness monitored   Q 15 min checks R   Pt is safe at this time

## 2017-10-25 NOTE — Plan of Care (Signed)
  Problem: Activity: Goal: Interest or engagement in activities will improve Outcome: Progressing   Problem: Safety: Goal: Periods of time without injury will increase Outcome: Progressing  DAR NOTE: Patient presents with anxious affect and depressed mood.  Denies suicidal thoughts, pain, auditory and visual hallucinations.  Described energy level as normal and concentration as good.  Rates depression at 0, hopelessness at 0, and anxiety at 0.  Maintained on routine safety checks.  Medications given as prescribed.  Support and encouragement offered as needed.  Attended group and participated.  States goal for today is "discharge."  Patient observed socializing with peers in the dayroom.  Offered no complaint.

## 2017-10-25 NOTE — [facility] Group Notes (Signed)
LCSW Group Therapy Note  10/25/2017 3:22 PM  Type of Therapy/Topic:  Group Therapy:  Emotion Regulation  Participation Level:  Minimal   Description of Group:   The purpose of this group is to assist patients in learning to regulate negative emotions and experience positive emotions. Patients will be guided to discuss ways in which they have been vulnerable to their negative emotions. These vulnerabilities will be juxtaposed with experiences of positive emotions or situations, and patients will be challenged to use positive emotions to combat negative ones. Special emphasis will be placed on coping with negative emotions in conflict situations, and patients will process healthy conflict resolution skills.  Therapeutic Goals: 1. Patient will identify two positive emotions or experiences to reflect on in order to balance out   negative emotions 2. Patient will label two or more emotions that they find the most difficult to experience 3. Patient will demonstrate positive conflict resolution skills through discussion and/or role plays  Summary of Patient Progress:  Stayed the entire time, appeared to be sleeping for much of the time but always responded appropriately when I asked him a question.  "I get irritated when I don't want to communicate, and other people want me to."      Therapeutic Modalities:   Cognitive Behavioral Therapy Feelings Identification Dialectical Behavioral Therapy   Marian Sorrow MSW Intern 10/25/2017 3:22 PM

## 2017-10-25 NOTE — Progress Notes (Signed)
Pt having trouble falling asleep this evening   Pt is agitated and making paranoid statements   Saying everything is after him   Messing with his hip and his bowels and his peepee   His statements were disorganized and didn't make any sense at all   He did take his medications and took extra medications for problems sleeping

## 2017-10-25 NOTE — Progress Notes (Signed)
Patient ID: Marc [name], male   DOB: 26-Jul-1955, 62 y.o.   MRN: [id number]   D: Patient pleasant on approach tonight. Reports that he had a good day today. 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 Currently denies any SI, HI, or a/v hallucinations. Reports some anxiety tonight A: Staff will continue to monitor on q 15 minute checks, follow treatment plan, and give medications as ordered. R: Cooperative on the unit.

## 2017-10-25 NOTE — Progress Notes (Signed)
Did not attend groups 

## 2017-10-25 NOTE — Progress Notes (Signed)
[facility] MD Progress Note  10/25/2017 3:05 PM Marc [name]  MRN:  [id number]  Subjective: Marc [name] reports, "I'm doing okay. I'm just trying to sleep. My mood is pretty good. I'm never in a bad mood. I'm here because my son wanted you guys to check me for drugs or something. I'm 62 years old now, too old for that kind of stuff. I got some shots to my arm one day this week, it made my arm sore. The only problem I have is when I look in the mirror, I don't look like myself. I see a person smaller, shorter or something with a different face".  Marc [name] is a 62 y/o M with history of schizophrenia who was admitted from [facility] on IVC placed in ED with worsening symptoms of psychosis, paranoia, irritability, and delusion that he is suffering back pain due to previous employer in [place]. Pt also has stressor of recent homelessness, and poor social support. He had not been taking any medications outside the hospital. He was medically cleared and then transferred to [facility] for additional treatment and stabilization.He was started on trial of zyprexa, and he had some incremental improvement of his symptoms, but he continued to report paranoia and delusions, and his son had expressed concerns about pt's previous history of poor adherence after discharge, so pt was transitioned to [place] oral form with tentative plan to transition to long-acting injectable form of [place].  Marc [name] is seen, chart reviewed. The chart findings discussed with the treatment team. Today upon evaluation, Marc [name] presents alert. He is lying down in his bed. He is making good eye contact. He says, "I'm doing okay. I'm just trying to sleep. My mood is pretty good. I'm never in a bad mood. I'm here because my son wanted you guys to check me for drugs or something. I'm 62 years old now, too old for that kind of stuff. I got some shots to my arm one day this week, it made my arm sore. The only problem I have is when I look in the  mirror, I don't look like myself. I see a person smaller, shorter or something with a different face".  He denies any specific complaints. 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 Pt denies any other concerns today. He is sleeping well. His appetite is good. He denies SI/HI/AH/VH other than the delusional thinking as stated above. He is tolerating Invega without difficulty or side effects. He complained of sore arm at the injection site. This site was inspected & assessed by this provider, no bruises or swellings noted. He received his first dose of [place] IM 2 days ago, the next dose is due on 10-26-17. He has agreed to continue current plan of care as already in progress.  Pt had no further questions, comments, or concerns.  Principal Problem: Schizophrenia (HCC)  Diagnosis:   Patient Active Problem List   Diagnosis Date Noted  . Schizophrenia (HCC) [F20.9]   . EPIDURAL ABSCESS [G06.2] 01/01/2007  . DM [E11.9] 12/17/2006  . DYSPHAGIA UNSPECIFIED [R13.10] 12/17/2006  . HYPERTENSION NEC [IMO0002] 12/17/2006   Total Time spent with patient: 15 minutes  Past Psychiatric History: See H&P  Past Medical History:  Past Medical History:  Diagnosis Date  . Bipolar 1 disorder (HCC)   . Diabetes mellitus without complication (HCC)   . Hypertension   . Schizophrenic disorder (HCC)    History reviewed. No pertinent surgical  history.  Family History: History reviewed. No pertinent family history.  Family Psychiatric  History: See H&P  Social History:  Social History   Substance and Sexual Activity  Alcohol Use Yes   Comment: very little     Social History   Substance and Sexual Activity  Drug Use Never    Social History   Socioeconomic History  . Marital status: Married    Spouse name: Not on file  . Number of children: Not on file  . Years of education: Not on file  . Highest  education level: Not on file  Occupational History  . Not on file  Social Needs  . Financial resource strain: Not on file  . Food insecurity:    Worry: Not on file    Inability: Not on file  . Transportation needs:    Medical: Not on file    Non-medical: Not on file  Tobacco Use  . Smoking status: Current Every Day Smoker    Packs/day: 2.00  . Smokeless tobacco: Never Used  Substance and Sexual Activity  . Alcohol use: Yes    Comment: very little  . Drug use: Never  . Sexual activity: Not Currently  Lifestyle  . Physical activity:    Days per week: Not on file    Minutes per session: Not on file  . Stress: Not on file  Relationships  . Social connections:    Talks on phone: Not on file    Gets together: Not on file    Attends religious service: Not on file    Active member of club or organization: Not on file    Attends meetings of clubs or organizations: Not on file    Relationship status: Not on file  Other Topics Concern  . Not on file  Social History Narrative  . Not on file   Additional Social History:   Sleep: Good  Appetite:  Good  Current Medications: Current Facility-Administered Medications  Medication Dose Route Frequency Provider Last Rate Last Dose  . hydrOXYzine (ATARAX/VISTARIL) tablet 50 mg  50 mg Oral Q6H PRN Micheal Likens, MD   50 mg at 10/24/17 2113  . ibuprofen (ADVIL,MOTRIN) tablet 600 mg  600 mg Oral Q6H PRN Micheal Likens, MD   600 mg at 10/23/17 1051  . LORazepam (ATIVAN) tablet 2 mg  2 mg Oral Q6H PRN Micheal Likens, MD   2 mg at 10/23/17 2203   Or  . LORazepam (ATIVAN) injection 2 mg  2 mg Intramuscular Q6H PRN Micheal Likens, MD      . OLANZapine zydis (ZYPREXA) disintegrating tablet 5 mg  5 mg Oral Q8H PRN Micheal Likens, MD       Or  . ziprasidone (GEODON) injection 20 mg  20 mg Intramuscular Q12H PRN Micheal Likens, MD      . Melene Muller ON 10/26/2017] paliperidone (INVEGA  SUSTENNA) injection 156 mg  156 mg Intramuscular Q30 days Jolyne Loa T, MD      . paliperidone (INVEGA) 24 hr tablet 6 mg  6 mg Oral Daily Micheal Likens, MD   6 mg at 10/25/17 0810  . traZODone (DESYREL) tablet 50 mg  50 mg Oral QHS,MR X 1 Nira Conn A, NP   50 mg at 10/24/17 2210   Lab Results: No results found for this or any previous visit (from the past 48 hour(s)).  Blood Alcohol level:  Lab Results  Component Value Date   ETH <10 10/16/2017  ETH <11 05/08/2013   Metabolic Disorder Labs: Lab Results  Component Value Date   HGBA1C (H) 08/09/2007    6.5 (NOTE)   The ADA recommends the following therapeutic goal for glycemic   control related to Hgb A1C measurement:   Goal of Therapy:   < 7.0% Hgb A1C   Reference: American Diabetes Association: Clinical Practice   Recommendations 2008, Diabetes Care,  2008, 31:(Suppl 1).   MPG 154 08/09/2007   MPG 343 10/16/2006   No results found for: PROLACTIN No results found for: CHOL, TRIG, HDL, CHOLHDL, VLDL, LDLCALC  Physical Findings: AIMS: Facial and Oral Movements Muscles of Facial Expression: None, normal Lips and Perioral Area: None, normal Jaw: None, normal Tongue: None, normal,Extremity Movements Upper (arms, wrists, hands, fingers): None, normal Lower (legs, knees, ankles, toes): None, normal, Trunk Movements Neck, shoulders, hips: None, normal, Overall Severity Severity of abnormal movements (highest score from questions above): None, normal Incapacitation due to abnormal movements: None, normal Patient's awareness of abnormal movements (rate only patient's report): No Awareness, Dental Status Current problems with teeth and/or dentures?: No Does patient usually wear dentures?: No  CIWA:    COWS:     Musculoskeletal: Strength & Muscle Tone: within normal limits Gait & Station: normal Patient leans: N/A  Psychiatric Specialty Exam: Physical Exam  Nursing note and vitals reviewed.   Review  of Systems  Constitutional: Negative for chills and fever.  Respiratory: Negative for cough and shortness of breath.   Cardiovascular: Negative for chest pain.  Gastrointestinal: Negative for abdominal pain, heartburn, nausea and vomiting.  Psychiatric/Behavioral: Negative for depression, hallucinations and suicidal ideas. The patient is not nervous/anxious and does not have insomnia.     Blood pressure (!) 198/120, pulse (!) 108, temperature 98.6 F (37 C), resp. rate 16, height 5\' 10"  (1.778 m), weight 106.6 kg.Body mass index is 33.72 kg/m.  General Appearance: Casual and Fairly Groomed  Eye Contact:  Good  Speech:  Clear and Coherent and Normal Rate  Volume:  Increased  Mood:  Euthymic  Affect:  Appropriate and Congruent  Thought Process:  Coherent and Goal Directed  Orientation:  Full (Time, Place, and Person)  Thought Content:  Delusions and Paranoid Ideation  Suicidal Thoughts:  No  Homicidal Thoughts:  No  Memory:  Immediate;   Fair Recent;   Fair Remote;   Fair  Judgement:  Fair  Insight:  Lacking  Psychomotor Activity:  Normal  Concentration:  Concentration: Fair  Recall:  [employer] of Knowledge:  Fair  Language:  Fair  Akathisia:  No  Handed:    AIMS (if indicated):     Assets:  Resilience Social Support  ADL's:  Intact  Cognition:  WNL  Sleep:  Number of Hours: 6.25   Treatment Plan Summary: Daily contact with patient to assess and evaluate symptoms and progress in treatment and Medication management   -Continue inpatient hospitalization.  -Will continue today 10/25/2017 plan as below except where it is noted.  -Schizophrenia -Continue Gean [place]chwood. Administered Sustenna 234 mg IM once 2 days ago followed by [place] 156mg  IM q30 days starting on 10/26/17             -Continue Invega 6mg  po qDay  -anxiety -Continue vistaril 50mg  po q6h prn anxiety  -agitation  -Continue ativan 2mg  po/IM q6h prn  anxiety -Continue zydis 5mg  po q8h prn agitation  -Continue geodon 20mg  IM q12h prn severe agitation  -insomnia -Continue trazodone 50mg  po qhs prn insomnia  -Encourage participation  in groups and therapeutic milieu  -disposition planning will be ongoing  Armandina Stammer, NP, PMHNP, FNP-BC. 10/25/2017, 3:05 PMPatient ID: Susy Manor, male   DOB: 1955/08/24, 62 y.o.   MRN: [id number]

## 2017-10-26 DIAGNOSIS — Z638 Other specified problems related to primary support group: Secondary | ICD-10-CM

## 2017-10-26 DIAGNOSIS — Z590 Homelessness: Secondary | ICD-10-CM

## 2017-10-26 MED ORDER — HYDROXYZINE HCL 50 MG PO TABS: 50.0000 mg | ORAL_TABLET | Freq: Four times a day (QID) | ORAL | 0 refills | Status: DC | PRN

## 2017-10-26 MED ORDER — PALIPERIDONE PALMITATE ER 156 MG/ML IM SUSY: 156.0000 mg | PREFILLED_SYRINGE | INTRAMUSCULAR | 0 refills | Status: DC

## 2017-10-26 NOTE — Progress Notes (Signed)
D: Pt alert and oriented on the unit.   A: Education, support, and encouragement provided. Discharge summary, medications and follow up appointments reviewed with pt. Suicide prevention resources provided, including "My 3 App." Pt's belongings in locker # 41 returned and belongings sheet signed.  R: Pt denies SI/HI, A/VH, pain, or any concerns at this time. Pt ambulatory on and off unit. Pt discharged to lobby.

## 2017-10-26 NOTE — Discharge Summary (Signed)
Physician Discharge Summary Note  Patient:  Marc [name] is an 62 y.o., male MRN:  [id number] DOB:  Dec 09, 1955 Patient phone:  [phone] (home)  Patient address:   [place] [address],  Total Time spent with patient: 30 minutes  Date of Admission:  10/17/2017 Date of Discharge: 10/26/2017  Reason for Admission:  Worsening psychosis/delusions  Principal Problem: Schizophrenia [facility]) Discharge Diagnoses: Patient Active Problem List   Diagnosis Date Noted  . Schizophrenia (HCC) [F20.9]   . EPIDURAL ABSCESS [G06.2] 01/01/2007  . DM [E11.9] 12/17/2006  . DYSPHAGIA UNSPECIFIED [R13.10] 12/17/2006  . HYPERTENSION NEC [IMO0002] 12/17/2006    Past Psychiatric History: see H&P  Past Medical History:  Past Medical History:  Diagnosis Date  . Bipolar 1 disorder (HCC)   . Diabetes mellitus without complication (HCC)   . Hypertension   . Schizophrenic disorder (HCC)    History reviewed. No pertinent surgical history. Family History: History reviewed. No pertinent family history. Family Psychiatric  History: see H&P  Social History:  Social History   Substance and Sexual Activity  Alcohol Use Yes   Comment: very little     Social History   Substance and Sexual Activity  Drug Use Never    Social History   Socioeconomic History  . Marital status: Married    Spouse name: Not on file  . Number of children: Not on file  . Years of education: Not on file  . Highest education level: Not on file  Occupational History  . Not on file  Social Needs  . Financial resource strain: Not on file  . Food insecurity:    Worry: Not on file    Inability: Not on file  . Transportation needs:    Medical: Not on file    Non-medical: Not on file  Tobacco Use  . Smoking status: Current Every Day Smoker    Packs/day: 2.00  . Smokeless tobacco: Never Used  Substance and Sexual Activity  . Alcohol use: Yes    Comment: very little  . Drug use: Never  . Sexual activity:  Not Currently  Lifestyle  . Physical activity:    Days per week: Not on file    Minutes per session: Not on file  . Stress: Not on file  Relationships  . Social connections:    Talks on phone: Not on file    Gets together: Not on file    Attends religious service: Not on file    Active member of club or organization: Not on file    Attends meetings of clubs or organizations: Not on file    Relationship status: Not on file  Other Topics Concern  . Not on file  Social History Narrative  . Not on file    Hospital Course:   Marc [name] is a 62 y/o M with history of schizophrenia who was [facility] from [facility] on IVC placed in ED with worsening symptoms of psychosis, paranoia, irritability, and delusion that he is suffering back pain due to previous employer in [place]. Pt also has stressor of recent homelessness, and poor social support. He had not been taking any medications outside the hospital. He was medically cleared and then transferred to [facility] for additional treatment and stabilization.He was started on trial of zyprexa, and he had some incremental improvement of his symptoms, but he continued to report paranoia and delusions, and his son had expressed concerns about pt's previous history of poor adherence after discharge, so pt was transitioned to [place] oral  form and then to long-acting injectable form of [place]. Pt has been tolerating his medications well, and he has been reporting improvement of his presenting symptoms.   Today upon evaluation, pt shares, "I'm doing good." He denies any specific concerns. He is sleeping well. His appetite is good. He denies other physical complaints. He denies SI/HI/AH/VH. In regards to his back discomfort which he associates with delusion of his previous company causing him discomfort, he states, "It's doing better today." His symptoms are not intrusive or bothersome at this point. He is tolerating his medications well - including the  Sustenna injection, and he is in agreement to continue his current regimen without changes. He will receive booster injection of Sustenna today prior to discharge. He plans to have follow up at the [facility] and [place]. He will be staying with his son after discharge. He was able to engage in safety planning including plan to return to [facility] or contact emergency services if he feels unable to maintain his own safety or the safety of others. Pt had no further questions, comments, or concerns.  Physical Findings: AIMS: Facial and Oral Movements Muscles of Facial Expression: None, normal Lips and Perioral Area: None, normal Jaw: None, normal Tongue: None, normal,Extremity Movements Upper (arms, wrists, hands, fingers): None, normal Lower (legs, knees, ankles, toes): None, normal, Trunk Movements Neck, shoulders, hips: None, normal, Overall Severity Severity of abnormal movements (highest score from questions above): None, normal Incapacitation due to abnormal movements: None, normal Patient's awareness of abnormal movements (rate only patient's report): No Awareness, Dental Status Current problems with teeth and/or dentures?: No Does patient usually wear dentures?: No  CIWA:    COWS:     Musculoskeletal: Strength & Muscle Tone: within normal limits Gait & Station: normal Patient leans: N/A  Psychiatric Specialty Exam: Physical Exam  Nursing note and vitals reviewed.   Review of Systems  Constitutional: Negative for chills and fever.  Respiratory: Negative for cough and shortness of breath.   Cardiovascular: Negative for chest pain.  Gastrointestinal: Negative for abdominal pain, heartburn, nausea and vomiting.  Psychiatric/Behavioral: Negative for depression, hallucinations and suicidal ideas. The patient is not nervous/anxious and does not have insomnia.     Blood pressure (!) 159/95, pulse 94, temperature 97.7 F (36.5 C), temperature source Oral, resp. rate 20, height 5\' 10"   (1.778 m), weight 106.6 kg.Body mass index is 33.72 kg/m.  General Appearance: Casual and Fairly Groomed  Eye Contact:  Good  Speech:  Clear and Coherent and Normal Rate  Volume:  Normal  Mood:  Euthymic  Affect:  Flat  Thought Process:  Coherent and Goal Directed  Orientation:  Full (Time, Place, and Person)  Thought Content:  Delusions  Suicidal Thoughts:  No  Homicidal Thoughts:  No  Memory:  Immediate;   Good Recent;   Good Remote;   Good  Judgement:  Good  Insight:  Fair  Psychomotor Activity:  Normal  Concentration:  Concentration: Good  Recall:  Good  Fund of Knowledge:  Good  Language:  Fair  Akathisia:  No  Handed:    AIMS (if indicated):     Assets:  Resilience Social Support  ADL's:  Intact  Cognition:  WNL  Sleep:  Number of Hours: 5.25     Have you used any form of tobacco in the last 30 days? (Cigarettes, Smokeless Tobacco, Cigars, and/or Pipes): Yes  Has this patient used any form of tobacco in the last 30 days? (Cigarettes, Smokeless Tobacco, Cigars, and/or  Pipes) Yes, Yes, A prescription for an FDA-approved tobacco cessation medication was offered at discharge and the patient refused  Blood Alcohol level:  Lab Results  Component Value Date   [facility] <10 10/16/2017   ETH <11 05/08/2013    Metabolic Disorder Labs:  Lab Results  Component Value Date   HGBA1C (H) 08/09/2007    6.5 (NOTE)   The ADA recommends the following therapeutic goal for glycemic   control related to Hgb A1C measurement:   Goal of Therapy:   < 7.0% Hgb A1C   Reference: American Diabetes Association: Clinical Practice   Recommendations 2008, Diabetes Care,  2008, 31:(Suppl 1).   MPG 154 08/09/2007   MPG 343 10/16/2006   No results found for: PROLACTIN No results found for: CHOL, TRIG, HDL, CHOLHDL, VLDL, LDLCALC  See Psychiatric Specialty Exam and Suicide Risk Assessment completed by Attending Physician prior to discharge.  Discharge destination:  Home  Is patient on multiple  antipsychotic therapies at discharge:  No   Has Patient had three or more failed trials of antipsychotic monotherapy by history:  No  Recommended Plan for Multiple Antipsychotic Therapies: NA   Allergies as of 10/26/2017   No Known Allergies     Medication List    STOP taking these medications   naproxen 375 MG tablet Commonly known as:  NAPROSYN   predniSONE 50 MG tablet Commonly known as:  DELTASONE   traMADol 50 MG tablet Commonly known as:  ULTRAM     TAKE these medications     Indication  hydrOXYzine 50 MG tablet Commonly known as:  ATARAX/VISTARIL Take 1 tablet (50 mg total) by mouth every 6 (six) hours as needed for anxiety.  Indication:  Feeling Anxious   paliperidone 156 MG/ML Susy injection Commonly known as:  INVEGA SUSTENNA Inject 1 mL (156 mg total) into the muscle every 30 (thirty) days. Administer next on 11/23/17 (last administered on 10/26/17). Start taking on:  11/25/2017  Indication:  Schizophrenia      Follow-up Information    [facility] Follow up.   Why:  In 9order to be seen here for services. you will need to walk in and ask to speak to the person in Eligibility.  They will then give you an appointment with a primary care physician, who would then refer you to the psychiatrist Bring your military ID Contact information: [address]   [place] [address] [phone] FAX:        [facility] Follow up on 10/29/2017.   Why:  Monday at 8:30 with Tresa Endo for your hospital follow up appointment.  Bring your ID, insurance card and your hospital d/c papserwork.  This is where you will go for services if you decide not to pursue services at the [facility] information: [address] [place] [address] [phone]          Follow-up recommendations:  Activity:  as tolerated Diet:  normal Tests:  NA Other:  see above for DC plan  Comments:    Signed: Micheal Likens, MD 10/26/2017, 12:42 PM

## 2017-10-26 NOTE — BHH Suicide Risk Assessment (Signed)
[facility] Discharge Suicide Risk Assessment   Principal Problem: Schizophrenia [facility]) Discharge Diagnoses:  Patient Active Problem List   Diagnosis Date Noted  . Schizophrenia (HCC) [F20.9]   . EPIDURAL ABSCESS [G06.2] 01/01/2007  . DM [E11.9] 12/17/2006  . DYSPHAGIA UNSPECIFIED [R13.10] 12/17/2006  . HYPERTENSION NEC [IMO0002] 12/17/2006    Total Time spent with patient: 30 minutes  Musculoskeletal: Strength & Muscle Tone: within normal limits Gait & Station: normal Patient leans: N/A  Psychiatric Specialty Exam: Review of Systems  Constitutional: Negative for chills and fever.  Respiratory: Negative for cough and shortness of breath.   Cardiovascular: Negative for chest pain.  Gastrointestinal: Negative for abdominal pain, heartburn, nausea and vomiting.  Psychiatric/Behavioral: Negative for depression, hallucinations and suicidal ideas. The patient is not nervous/anxious and does not have insomnia.     Blood pressure (!) 159/95, pulse 94, temperature 97.7 F (36.5 C), temperature source Oral, resp. rate 20, height 5\' 10"  (1.778 m), weight 106.6 kg.Body mass index is 33.72 kg/m.  General Appearance: Casual and Fairly Groomed  Patent attorney::  Good  Speech:  Clear and Coherent and Normal Rate  Volume:  Normal  Mood:  Euthymic  Affect:  Flat  Thought Process:  Coherent and Goal Directed  Orientation:  Full (Time, Place, and Person)  Thought Content:  Delusions  Suicidal Thoughts:  No  Homicidal Thoughts:  No  Memory:  Immediate;   Fair Recent;   Fair Remote;   Fair  Judgement:  Fair  Insight:  Lacking  Psychomotor Activity:  Normal  Concentration:  Fair  Recall:  Good  Fund of Knowledge:Fair  Language: Good  Akathisia:  No  Handed:    AIMS (if indicated):     Assets:  Physical Health Resilience  Sleep:  Number of Hours: 5.25  Cognition: WNL  ADL's:  Intact   Mental Status Per Nursing Assessment::   On Admission:  NA  Demographic Factors:  Male, Divorced or  widowed, Low socioeconomic status and Unemployed  Loss Factors: Financial problems/change in socioeconomic status  Historical Factors: Impulsivity  Risk Reduction Factors:   Sense of responsibility to family, Living with another person, especially a relative, Positive social support, Positive therapeutic relationship and Positive coping skills or problem solving skills  Continued Clinical Symptoms:  Schizophrenia:   Paranoid or undifferentiated type  Cognitive Features That Contribute To Risk:  None    Suicide Risk:  Minimal: No identifiable suicidal ideation.  Patients presenting with no risk factors but with morbid ruminations; may be classified as minimal risk based on the severity of the depressive symptoms  Follow-up Information    [facility] Follow up.   Why:  In 9order to be seen here for services. you will need to walk in and ask to speak to the person in Eligibility.  They will then give you an appointment with a primary care physician, who would then refer you to the psychiatrist Bring your military ID Contact information: [address]   [place] [address] [phone] FAX:        [facility] Follow up on 10/29/2017.   Why:  Monday at 8:30 with Tresa Endo for your hospital follow up appointment.  Bring your ID, insurance card and your hospital d/c papserwork.  This is where you will go for services if you decide not to pursue services at the [facility] information: [address] [place] [address] [phone]         Subjective Data:  Marc [name] is a  61 y/o M with history of schizophrenia who was admitted from [facility] on IVC placed in ED with worsening symptoms of psychosis, paranoia, irritability, and delusion that he is suffering back pain due to previous employer in [place]. Pt also has stressor of recent homelessness, and poor social support. He had not been taking any medications outside the hospital. He was medically  cleared and then transferred to [facility] for additional treatment and stabilization.He was started on trial of zyprexa, and he had some incremental improvement of his symptoms, but he continued to report paranoia and delusions, and his son had expressed concerns about pt's previous history of poor adherence after discharge, so pt was transitioned to [place] oral form and then to long-acting injectable form of [place]. Pt has been tolerating his medications well, and he has been reporting improvement of his presenting symptoms.   Today upon evaluation, pt shares, "I'm doing good." He denies any specific concerns. He is sleeping well. His appetite is good. He denies other physical complaints. He denies SI/HI/AH/VH. In regards to his back discomfort which he associates with delusion of his previous company causing him discomfort, he states, "It's doing better today." His symptoms are not intrusive or bothersome at this point. He is tolerating his medications well - including the Sustenna injection, and he is in agreement to continue his current regimen without changes. He will receive booster injection of Sustenna today prior to discharge. He plans to have follow up at the [facility] and [place]. He will be staying with his son after discharge. He was able to engage in safety planning including plan to return to [facility] or contact emergency services if he feels unable to maintain his own safety or the safety of others. Pt had no further questions, comments, or concerns.   Plan Of Care/Follow-up recommendations:   -Discharge to outpatient level of care  -Schizophrenia -Continue Gean Birchwood. Sustenna 234mg  IM given once on 10/23/17. Continue Sustenna 156mg  IM q30 days (started on 10/26/17) -Continue Invega 6mg  po qDay  -anxiety -Continue vistaril 50mg  po q6h prn anxiety  -insomnia -Continue trazodone 50mg  po qhs prn insomnia  Activity:  as  tolerated Diet:  normal Tests:  NA Other:  see above for DC plan  Micheal Likens, MD 10/26/2017, 12:35 PM

## 2017-10-26 NOTE — Progress Notes (Signed)
Recreation Therapy Notes  Date: 10.18.19 Time: 1000 Location: 500 Hall Dayroom  Group Topic: Communication, Team Building, Problem Solving  Goal Area(s) Addresses:  Patient will effectively work with peer towards shared goal.  Patient will identify skill used to make activity successful.  Patient will identify how skills used during activity can be used to reach post d/c goals.   Intervention: STEM Activity   Activity: Pipe Cleaner Tower. In teams, patients were asked to build the tallest freestanding tower possible out of 15 pipe cleaners. Systematically resources were removed, for example patient ability to use both hands and patient ability to verbally communicate.    Education:Social Skills, Discharge Planning.   Education Outcome: Acknowledges education/In group clarification offered/Needs additional education.   Clinical Observations/Feedback:  Pt did not attend group.    Ferdie Bakken, LRT/CTRS         Gayatri Teasdale A 10/26/2017 12:02 PM 

## 2017-10-26 NOTE — Progress Notes (Signed)
Recreation Therapy Notes  INPATIENT RECREATION TR PLAN  Patient Details Name: Marc [name] MRN: [id number] DOB: 03-08-1955 Today's Date: 10/26/2017  Rec Therapy Plan Is patient appropriate for Therapeutic Recreation?: Yes Treatment times per week: about 3 days Estimated Length of Stay: 5-7 days TR Treatment/Interventions: Group participation (Comment)  Discharge Criteria Pt will be discharged from therapy if:: Discharged Treatment plan/goals/alternatives discussed and agreed upon by:: Patient/family  Discharge Summary Short term goals set: See patient care plan. Short term goals met: Complete Progress toward goals comments: Groups attended Which groups?: Communication, Stress management, Coping skills Reason goals not met: None Therapeutic equipment acquired: N/A Reason patient discharged from therapy: Discharge from hospital Pt/family agrees with progress & goals achieved: Yes Date patient discharged from therapy: 10/26/17    Victorino Sparrow, LRT/CTRS  Ria Comment, Ceasar Decandia A 10/26/2017, 12:09 PM

## 2017-10-26 NOTE — Plan of Care (Signed)
Pt engaged in groups without prompting from LRT at completion of recreation therapy group sessions.    Demaryius Imran, LRT/CTRS 

## 2017-10-30 NOTE — ED Triage Notes (Signed)
Pt called from triage with no answer 

## 2017-10-31 ENCOUNTER — Emergency Department (HOSPITAL_COMMUNITY): Admission: EM | Admit: 2017-10-31 | Discharge: 2017-10-31 | Discharge status: Against Medical Advice | Payer: Self-pay

## 2017-10-31 NOTE — ED Triage Notes (Signed)
Pt called again from triage with no answer 

## 2018-11-15 ENCOUNTER — Emergency Department (HOSPITAL_COMMUNITY)
Admission: EM | Admit: 2018-11-15 | Discharge: 2018-11-15 | Disposition: A | Discharge status: Home / Self Care | Payer: Self-pay | Attending: Emergency Medicine | Admitting: Emergency Medicine

## 2018-11-15 ENCOUNTER — Encounter (HOSPITAL_COMMUNITY): Payer: Self-pay

## 2018-11-15 ENCOUNTER — Other Ambulatory Visit: Payer: Self-pay

## 2018-11-15 DIAGNOSIS — E119 Type 2 diabetes mellitus without complications: Secondary | ICD-10-CM | POA: Insufficient documentation

## 2018-11-15 DIAGNOSIS — I10 Essential (primary) hypertension: Secondary | ICD-10-CM | POA: Insufficient documentation

## 2018-11-15 DIAGNOSIS — F17200 Nicotine dependence, unspecified, uncomplicated: Secondary | ICD-10-CM | POA: Insufficient documentation

## 2018-11-15 DIAGNOSIS — Z79899 Other long term (current) drug therapy: Secondary | ICD-10-CM | POA: Insufficient documentation

## 2018-11-15 DIAGNOSIS — N390 Urinary tract infection, site not specified: Secondary | ICD-10-CM | POA: Insufficient documentation

## 2018-11-15 LAB — CBC
HCT: 51.9 % (ref 39.0–52.0)
Hemoglobin: 15.7 g/dL (ref 13.0–17.0)
MCH: 26.5 pg (ref 26.0–34.0)
MCHC: 30.3 g/dL (ref 30.0–36.0)
MCV: 87.7 fL (ref 80.0–100.0)
Platelets: 188 10*3/uL (ref 150–400)
RBC: 5.92 MIL/uL — ABNORMAL HIGH (ref 4.22–5.81)
RDW: 16.5 % — ABNORMAL HIGH (ref 11.5–15.5)
WBC: 5.6 10*3/uL (ref 4.0–10.5)
nRBC: 0 % (ref 0.0–0.2)

## 2018-11-15 LAB — URINALYSIS, ROUTINE W REFLEX MICROSCOPIC
Bacteria, UA: NONE SEEN
Bilirubin Urine: NEGATIVE
Glucose, UA: NEGATIVE mg/dL
Ketones, ur: NEGATIVE mg/dL
Nitrite: NEGATIVE
Protein, ur: 100 mg/dL — AB
RBC / HPF: >50 RBC/hpf — ABNORMAL HIGH (ref 0–5)
Specific Gravity, Urine: 1.023 (ref 1.005–1.030)
WBC, UA: >50 WBC/hpf — ABNORMAL HIGH (ref 0–5)
pH: 5 (ref 5.0–8.0)

## 2018-11-15 LAB — COMPREHENSIVE METABOLIC PANEL
ALT: 14 U/L (ref 0–44)
AST: 14 U/L — ABNORMAL LOW (ref 15–41)
Albumin: 4.1 g/dL (ref 3.5–5.0)
Alkaline Phosphatase: 67 U/L (ref 38–126)
Anion gap: 9 (ref 5–15)
BUN: 17 mg/dL (ref 8–23)
CO2: 25 mmol/L (ref 22–32)
Calcium: 8.8 mg/dL — ABNORMAL LOW (ref 8.9–10.3)
Chloride: 106 mmol/L (ref 98–111)
Creatinine, Ser: 0.81 mg/dL (ref 0.61–1.24)
GFR calc Af Amer: >60 mL/min (ref >60)
GFR calc non Af Amer: >60 mL/min (ref >60)
Glucose, Bld: 130 mg/dL — ABNORMAL HIGH (ref 70–99)
Potassium: 4 mmol/L (ref 3.5–5.1)
Sodium: 140 mmol/L (ref 135–145)
Total Bilirubin: 0.5 mg/dL (ref 0.3–1.2)
Total Protein: 6.9 g/dL (ref 6.5–8.1)

## 2018-11-15 MED ORDER — CEPHALEXIN 500 MG PO CAPS: 500.0000 mg | ORAL_CAPSULE | Freq: Four times a day (QID) | ORAL | 0 refills | Status: DC

## 2018-11-15 MED ORDER — CEPHALEXIN 500 MG PO CAPS
1000.0000 mg | ORAL_CAPSULE | Freq: Once | ORAL | Status: AC
  Administered 2018-11-15: 1000 mg via ORAL
  Filled 2018-11-15: qty 2

## 2018-11-15 NOTE — ED Notes (Signed)
Pt called out for restroom assistance. Pt found standing in the room looking at the monitor. PT was unhooked and told that when he finishes using the restroom, we'll take one more set of vital signs and then he can get dressed. Pt became upset, yelling "I know my body and my body is not whole. I want to be whole again.  Y'all don't get it. I can't take this, I can't take this." Pt was informed that he was accessed by the doctor and he has cleared him for discharge. Pt did not appear to be in any distress and pt ambulated with a steady gait.

## 2018-11-15 NOTE — ED Notes (Signed)
Pt's bladder was scanned and resulted 31mL x3.

## 2018-11-15 NOTE — Discharge Instructions (Signed)
It was our pleasure to provide your ER care today - we hope that you feel better.  The lab tests show that you have a urine infection - take antibiotic as prescribed.   Follow up with primary care doctor in 1 week.  Return to ER if worse, new symptoms, high fevers, new/worsening or severe pain, persistent vomiting, or other concern.

## 2018-11-15 NOTE — ED Triage Notes (Addendum)
Patient reports not being able to urinate X 6 years. Patient feels when he voids he is not completely emptying his bladder.   Patient report being constipated for several years.  Last BM yesterday that was darker than normal.   Patient reports "rash/dry skin" on legs.    Patient reports that someone is trying to make him sick from his work company.   Patient reports smoking a pack of cigarettes a day.   Denies pain at this time.  Denies having PCP.  Patient lives alone.  Patient denies taking any medication at home for hypertension or Diabetes.  Denies N/V/D   A/Ox4 Ambulatory in triage.   VH:[id number]

## 2018-11-15 NOTE — ED Provider Notes (Addendum)
[facility] DEPT Provider Note   CSN: [id number] Arrival date & time: 11/15/18  1605     History   Chief Complaint Chief Complaint  Patient presents with  . Multiple complaints    HPI Marc [name] is a 63 y.o. male.     Patient c/o trouble voiding for the past several years and feeling generally weak in past few weeks. Symptoms gradual onset, moderate, persistent, without acute or abrupt change today or this week. Patient denies any focal or unilateral weakness. No numbness. Denies headache. No neck or back pain. No chest pain or sob. Denies cough or uri symptoms. No known covid + exposure. No recent wt loss or gain. No fever or chills.   The history is provided by the patient.    Past Medical History:  Diagnosis Date  . Bipolar 1 disorder ([place])   . Diabetes mellitus without complication ([place])   . Hypertension   . Schizophrenic disorder [facility])     Patient Active Problem List   Diagnosis Date Noted  . Schizophrenia ([place])   . EPIDURAL ABSCESS 01/01/2007  . DM 12/17/2006  . DYSPHAGIA UNSPECIFIED 12/17/2006  . HYPERTENSION NEC 12/17/2006    History reviewed. No pertinent surgical history.      Home Medications    Prior to Admission medications   Medication Sig Start Date End Date Taking? Authorizing Provider  hydrOXYzine (ATARAX/VISTARIL) 50 MG tablet Take 1 tablet (50 mg total) by mouth every 6 (six) hours as needed for anxiety. 10/26/17   Pennelope Bracken, MD  paliperidone (INVEGA SUSTENNA) 156 MG/ML SUSY injection Inject 1 mL (156 mg total) into the muscle every 30 (thirty) days. Administer next on 11/23/17 (last administered on 10/26/17). 11/25/17   Pennelope Bracken, MD    Family History History reviewed. No pertinent family history.  Social History Social History   Tobacco Use  . Smoking status: Current Every Day Smoker    Packs/day: 2.00  . Smokeless tobacco: Never Used  Substance Use Topics  .  Alcohol use: Yes    Comment: very little  . Drug use: Never     Allergies   Patient has no known allergies.   Review of Systems Review of Systems  Constitutional: Negative for fever.  HENT: Negative for sore throat.   Eyes: Negative for redness.  Respiratory: Negative for cough and shortness of breath.   Cardiovascular: Negative for chest pain.  Gastrointestinal: Negative for abdominal pain.  Endocrine: Negative for polyuria.  Genitourinary: Negative for dysuria, flank pain and hematuria.  Musculoskeletal: Negative for back pain and neck pain.  Skin: Negative for rash.  Neurological: Negative for headaches.  Hematological: Does not bruise/bleed easily.  Psychiatric/Behavioral: Negative for confusion.     Physical Exam Updated Vital Signs BP 130/76 (BP Location: Right Arm)   Pulse 92   Temp 98.7 F (37.1 C) (Oral)   Resp 18   SpO2 97%   Physical Exam Vitals signs and nursing note reviewed.  Constitutional:      Appearance: Normal appearance. He is well-developed.  HENT:     Head: Atraumatic.     Nose: Nose normal.     Mouth/Throat:     Mouth: Mucous membranes are moist.     Pharynx: Oropharynx is clear.  Eyes:     General: No scleral icterus.    Conjunctiva/sclera: Conjunctivae normal.     Pupils: Pupils are equal, round, and reactive to light.  Neck:     Musculoskeletal: Normal range of  motion and neck supple. No neck rigidity.     Trachea: No tracheal deviation.  Cardiovascular:     Rate and Rhythm: Normal rate and regular rhythm.     Pulses: Normal pulses.     Heart sounds: Normal heart sounds. No murmur. No friction rub. No gallop.   Pulmonary:     Effort: Pulmonary effort is normal. No accessory muscle usage or respiratory distress.     Breath sounds: Normal breath sounds.  Abdominal:     General: Bowel sounds are normal. There is no distension.     Palpations: Abdomen is soft. There is no mass.     Tenderness: There is no abdominal tenderness.  There is no guarding or rebound.     Hernia: No hernia is present.  Genitourinary:    Comments: No cva tenderness. Normal external gu exam.  Musculoskeletal:        General: No swelling or tenderness.     Right lower leg: No edema.     Left lower leg: No edema.     Comments: CTLS spine, non tender, aligned, no step off.   Skin:    General: Skin is warm and dry.     Findings: No rash.  Neurological:     Mental Status: He is alert.     Comments: Alert, speech clear. Motor/sens grossly intact bil. Steady gait.   Psychiatric:        Mood and Affect: Mood normal.      ED Treatments / Results  Labs (all labs ordered are listed, but only abnormal results are displayed) Results for orders placed or performed during the hospital encounter of 11/15/18  Comprehensive metabolic panel  Result Value Ref Range   Sodium 140 135 - 145 mmol/L   Potassium 4.0 3.5 - 5.1 mmol/L   Chloride 106 98 - 111 mmol/L   CO2 25 22 - 32 mmol/L   Glucose, Bld 130 (H) 70 - 99 mg/dL   BUN 17 8 - 23 mg/dL   Creatinine, Ser 1.610.81 0.61 - 1.24 mg/dL   Calcium 8.8 (L) 8.9 - 10.3 mg/dL   Total Protein 6.9 6.5 - 8.1 g/dL   Albumin 4.1 3.5 - 5.0 g/dL   AST 14 (L) 15 - 41 U/L   ALT 14 0 - 44 U/L   Alkaline Phosphatase 67 38 - 126 U/L   Total Bilirubin 0.5 0.3 - 1.2 mg/dL   GFR calc non Af Amer >60 >60 mL/min   GFR calc Af Amer >60 >60 mL/min   Anion gap 9 5 - 15  Urinalysis, Routine w reflex microscopic  Result Value Ref Range   Color, Urine YELLOW YELLOW   APPearance CLOUDY (A) CLEAR   Specific Gravity, Urine 1.023 1.005 - 1.030   pH 5.0 5.0 - 8.0   Glucose, UA NEGATIVE NEGATIVE mg/dL   Hgb urine dipstick MODERATE (A) NEGATIVE   Bilirubin Urine NEGATIVE NEGATIVE   Ketones, ur NEGATIVE NEGATIVE mg/dL   Protein, ur 096100 (A) NEGATIVE mg/dL   Nitrite NEGATIVE NEGATIVE   Leukocytes,Ua LARGE (A) NEGATIVE   RBC / HPF >50 (H) 0 - 5 RBC/hpf   WBC, UA >50 (H) 0 - 5 WBC/hpf   Bacteria, UA NONE SEEN NONE SEEN    Squamous Epithelial / LPF 0-5 0 - 5   Mucus PRESENT   CBC  Result Value Ref Range   WBC 5.6 4.0 - 10.5 K/uL   RBC 5.92 (H) 4.22 - 5.81 MIL/uL   Hemoglobin 15.7 13.0 -  17.0 g/dL   HCT 30.8 65.7 - 84.6 %   MCV 87.7 80.0 - 100.0 fL   MCH 26.5 26.0 - 34.0 pg   MCHC 30.3 30.0 - 36.0 g/dL   RDW 96.2 (H) 95.2 - 84.1 %   Platelets 188 150 - 400 K/uL   nRBC 0.0 0.0 - 0.2 %    EKG None  Radiology No results found.  Procedures Procedures (including critical care time)  Medications Ordered in ED Medications - No data to display   Initial Impression / Assessment and Plan / ED Course  I have reviewed the triage vital signs and the nursing notes.  Pertinent labs & imaging results that were available during my care of the patient were reviewed by me and considered in my medical decision making (see chart for details).  Labs sent.   Reviewed nursing notes and prior charts for additional history.   Labs reviewed/interpreted by me - c/w uti. No penile drip or discharge. No scrotal or testicular pain or tenderness. abd soft nt.   Confirmed nkda w pt.   Bladder scan - post void residual 0.   Keflex po. rx for home.   Patient is eating and drinking. Appears well hydrated. Pt currently appears stable for d/c.   rec pcp f/u.  Return precautions provided.     Final Clinical Impressions(s) / ED Diagnoses   Final diagnoses:  None    ED Discharge Orders    None           Cathren Laine, MD 11/15/18 2159

## 2018-11-15 NOTE — ED Notes (Signed)
PT STATES HE IS STEPPING OUTSIDE FOR SOME AIR. PT IS WITHOUT DISTRESS, CALM AND COOPERATIVE WITH CARE. UPDATED ON BED STATUS AND PLAN OF CARE.

## 2018-11-17 LAB — URINE CULTURE: Culture: <10000 — AB

## 2019-01-21 ENCOUNTER — Emergency Department (HOSPITAL_COMMUNITY): Payer: Medicare Other

## 2019-01-21 ENCOUNTER — Encounter (HOSPITAL_COMMUNITY): Payer: Self-pay | Admitting: Pulmonary Disease

## 2019-01-21 ENCOUNTER — Inpatient Hospital Stay (HOSPITAL_COMMUNITY)
Admission: EM | Admit: 2019-01-21 | Discharge: 2019-04-17 | DRG: 004 | Disposition: A | Discharge status: Skilled Nursing Facility | Payer: Medicare Other | Attending: Internal Medicine | Admitting: Internal Medicine

## 2019-01-21 DIAGNOSIS — S0083XA Contusion of other part of head, initial encounter: Secondary | ICD-10-CM | POA: Diagnosis present

## 2019-01-21 DIAGNOSIS — J9622 Acute and chronic respiratory failure with hypercapnia: Secondary | ICD-10-CM | POA: Diagnosis not present

## 2019-01-21 DIAGNOSIS — Z8614 Personal history of Methicillin resistant Staphylococcus aureus infection: Secondary | ICD-10-CM

## 2019-01-21 DIAGNOSIS — E8809 Other disorders of plasma-protein metabolism, not elsewhere classified: Secondary | ICD-10-CM | POA: Diagnosis not present

## 2019-01-21 DIAGNOSIS — Z4659 Encounter for fitting and adjustment of other gastrointestinal appliance and device: Secondary | ICD-10-CM

## 2019-01-21 DIAGNOSIS — Z781 Physical restraint status: Secondary | ICD-10-CM

## 2019-01-21 DIAGNOSIS — Z431 Encounter for attention to gastrostomy: Secondary | ICD-10-CM

## 2019-01-21 DIAGNOSIS — Z23 Encounter for immunization: Secondary | ICD-10-CM | POA: Diagnosis present

## 2019-01-21 DIAGNOSIS — J9690 Respiratory failure, unspecified, unspecified whether with hypoxia or hypercapnia: Secondary | ICD-10-CM

## 2019-01-21 DIAGNOSIS — R579 Shock, unspecified: Secondary | ICD-10-CM

## 2019-01-21 DIAGNOSIS — I330 Acute and subacute infective endocarditis: Secondary | ICD-10-CM | POA: Diagnosis present

## 2019-01-21 DIAGNOSIS — F319 Bipolar disorder, unspecified: Secondary | ICD-10-CM | POA: Diagnosis present

## 2019-01-21 DIAGNOSIS — L259 Unspecified contact dermatitis, unspecified cause: Secondary | ICD-10-CM | POA: Diagnosis present

## 2019-01-21 DIAGNOSIS — T68XXXA Hypothermia, initial encounter: Secondary | ICD-10-CM | POA: Diagnosis present

## 2019-01-21 DIAGNOSIS — R7881 Bacteremia: Secondary | ICD-10-CM | POA: Diagnosis not present

## 2019-01-21 DIAGNOSIS — R0902 Hypoxemia: Secondary | ICD-10-CM

## 2019-01-21 DIAGNOSIS — J9621 Acute and chronic respiratory failure with hypoxia: Secondary | ICD-10-CM | POA: Diagnosis present

## 2019-01-21 DIAGNOSIS — F05 Delirium due to known physiological condition: Secondary | ICD-10-CM | POA: Diagnosis present

## 2019-01-21 DIAGNOSIS — G92 Toxic encephalopathy: Secondary | ICD-10-CM | POA: Diagnosis present

## 2019-01-21 DIAGNOSIS — N179 Acute kidney failure, unspecified: Secondary | ICD-10-CM | POA: Diagnosis present

## 2019-01-21 DIAGNOSIS — I38 Endocarditis, valve unspecified: Secondary | ICD-10-CM | POA: Diagnosis present

## 2019-01-21 DIAGNOSIS — L89212 Pressure ulcer of right hip, stage 2: Secondary | ICD-10-CM | POA: Diagnosis not present

## 2019-01-21 DIAGNOSIS — B964 Proteus (mirabilis) (morganii) as the cause of diseases classified elsewhere: Secondary | ICD-10-CM | POA: Diagnosis not present

## 2019-01-21 DIAGNOSIS — E872 Acidosis: Secondary | ICD-10-CM | POA: Diagnosis present

## 2019-01-21 DIAGNOSIS — T17918A Gastric contents in respiratory tract, part unspecified causing other injury, initial encounter: Secondary | ICD-10-CM | POA: Diagnosis not present

## 2019-01-21 DIAGNOSIS — K439 Ventral hernia without obstruction or gangrene: Secondary | ICD-10-CM | POA: Diagnosis present

## 2019-01-21 DIAGNOSIS — E870 Hyperosmolality and hypernatremia: Secondary | ICD-10-CM | POA: Diagnosis present

## 2019-01-21 DIAGNOSIS — H5589 Other irregular eye movements: Secondary | ICD-10-CM | POA: Diagnosis present

## 2019-01-21 DIAGNOSIS — R451 Restlessness and agitation: Secondary | ICD-10-CM | POA: Diagnosis not present

## 2019-01-21 DIAGNOSIS — R1310 Dysphagia, unspecified: Secondary | ICD-10-CM | POA: Diagnosis present

## 2019-01-21 DIAGNOSIS — S0181XA Laceration without foreign body of other part of head, initial encounter: Secondary | ICD-10-CM | POA: Diagnosis not present

## 2019-01-21 DIAGNOSIS — Z9911 Dependence on respirator [ventilator] status: Secondary | ICD-10-CM | POA: Diagnosis not present

## 2019-01-21 DIAGNOSIS — Z930 Tracheostomy status: Secondary | ICD-10-CM

## 2019-01-21 DIAGNOSIS — L89152 Pressure ulcer of sacral region, stage 2: Secondary | ICD-10-CM | POA: Diagnosis not present

## 2019-01-21 DIAGNOSIS — N136 Pyonephrosis: Secondary | ICD-10-CM | POA: Diagnosis not present

## 2019-01-21 DIAGNOSIS — T17908A Unspecified foreign body in respiratory tract, part unspecified causing other injury, initial encounter: Secondary | ICD-10-CM | POA: Diagnosis not present

## 2019-01-21 DIAGNOSIS — J9600 Acute respiratory failure, unspecified whether with hypoxia or hypercapnia: Secondary | ICD-10-CM

## 2019-01-21 DIAGNOSIS — W07XXXA Fall from chair, initial encounter: Secondary | ICD-10-CM | POA: Diagnosis not present

## 2019-01-21 DIAGNOSIS — Z9981 Dependence on supplemental oxygen: Secondary | ICD-10-CM

## 2019-01-21 DIAGNOSIS — E860 Dehydration: Secondary | ICD-10-CM | POA: Diagnosis present

## 2019-01-21 DIAGNOSIS — Z9114 Patient's other noncompliance with medication regimen: Secondary | ICD-10-CM

## 2019-01-21 DIAGNOSIS — E8770 Fluid overload, unspecified: Secondary | ICD-10-CM | POA: Diagnosis not present

## 2019-01-21 DIAGNOSIS — R7401 Elevation of levels of liver transaminase levels: Secondary | ICD-10-CM | POA: Diagnosis present

## 2019-01-21 DIAGNOSIS — Z794 Long term (current) use of insulin: Secondary | ICD-10-CM

## 2019-01-21 DIAGNOSIS — J9601 Acute respiratory failure with hypoxia: Secondary | ICD-10-CM | POA: Diagnosis not present

## 2019-01-21 DIAGNOSIS — R040 Epistaxis: Secondary | ICD-10-CM | POA: Diagnosis not present

## 2019-01-21 DIAGNOSIS — M6250 Muscle wasting and atrophy, not elsewhere classified, unspecified site: Secondary | ICD-10-CM | POA: Diagnosis not present

## 2019-01-21 DIAGNOSIS — R0600 Dyspnea, unspecified: Secondary | ICD-10-CM

## 2019-01-21 DIAGNOSIS — R4182 Altered mental status, unspecified: Secondary | ICD-10-CM

## 2019-01-21 DIAGNOSIS — R059 Cough, unspecified: Secondary | ICD-10-CM

## 2019-01-21 DIAGNOSIS — R6521 Severe sepsis with septic shock: Secondary | ICD-10-CM | POA: Diagnosis present

## 2019-01-21 DIAGNOSIS — B3321 Viral endocarditis: Secondary | ICD-10-CM | POA: Diagnosis not present

## 2019-01-21 DIAGNOSIS — D6959 Other secondary thrombocytopenia: Secondary | ICD-10-CM | POA: Diagnosis present

## 2019-01-21 DIAGNOSIS — Z452 Encounter for adjustment and management of vascular access device: Secondary | ICD-10-CM

## 2019-01-21 DIAGNOSIS — R001 Bradycardia, unspecified: Secondary | ICD-10-CM | POA: Diagnosis present

## 2019-01-21 DIAGNOSIS — Z931 Gastrostomy status: Secondary | ICD-10-CM

## 2019-01-21 DIAGNOSIS — Z20822 Contact with and (suspected) exposure to covid-19: Secondary | ICD-10-CM | POA: Diagnosis present

## 2019-01-21 DIAGNOSIS — G9340 Encephalopathy, unspecified: Secondary | ICD-10-CM | POA: Diagnosis present

## 2019-01-21 DIAGNOSIS — Z9181 History of falling: Secondary | ICD-10-CM

## 2019-01-21 DIAGNOSIS — Z1611 Resistance to penicillins: Secondary | ICD-10-CM | POA: Diagnosis not present

## 2019-01-21 DIAGNOSIS — R404 Transient alteration of awareness: Secondary | ICD-10-CM | POA: Diagnosis present

## 2019-01-21 DIAGNOSIS — Z751 Person awaiting admission to adequate facility elsewhere: Secondary | ICD-10-CM

## 2019-01-21 DIAGNOSIS — R0602 Shortness of breath: Secondary | ICD-10-CM

## 2019-01-21 DIAGNOSIS — E875 Hyperkalemia: Secondary | ICD-10-CM | POA: Diagnosis not present

## 2019-01-21 DIAGNOSIS — A4101 Sepsis due to Methicillin susceptible Staphylococcus aureus: Secondary | ICD-10-CM | POA: Diagnosis present

## 2019-01-21 DIAGNOSIS — F259 Schizoaffective disorder, unspecified: Secondary | ICD-10-CM | POA: Diagnosis present

## 2019-01-21 DIAGNOSIS — R4587 Impulsiveness: Secondary | ICD-10-CM | POA: Diagnosis not present

## 2019-01-21 DIAGNOSIS — Z978 Presence of other specified devices: Secondary | ICD-10-CM | POA: Diagnosis not present

## 2019-01-21 DIAGNOSIS — B9561 Methicillin susceptible Staphylococcus aureus infection as the cause of diseases classified elsewhere: Secondary | ICD-10-CM | POA: Diagnosis not present

## 2019-01-21 DIAGNOSIS — Z0189 Encounter for other specified special examinations: Secondary | ICD-10-CM

## 2019-01-21 DIAGNOSIS — L089 Local infection of the skin and subcutaneous tissue, unspecified: Secondary | ICD-10-CM | POA: Diagnosis present

## 2019-01-21 DIAGNOSIS — L8990 Pressure ulcer of unspecified site, unspecified stage: Secondary | ICD-10-CM | POA: Diagnosis present

## 2019-01-21 DIAGNOSIS — R600 Localized edema: Secondary | ICD-10-CM | POA: Diagnosis not present

## 2019-01-21 DIAGNOSIS — L309 Dermatitis, unspecified: Secondary | ICD-10-CM | POA: Diagnosis not present

## 2019-01-21 DIAGNOSIS — I059 Rheumatic mitral valve disease, unspecified: Secondary | ICD-10-CM | POA: Diagnosis present

## 2019-01-21 DIAGNOSIS — R296 Repeated falls: Secondary | ICD-10-CM | POA: Diagnosis present

## 2019-01-21 DIAGNOSIS — D638 Anemia in other chronic diseases classified elsewhere: Secondary | ICD-10-CM | POA: Diagnosis present

## 2019-01-21 DIAGNOSIS — E11649 Type 2 diabetes mellitus with hypoglycemia without coma: Secondary | ICD-10-CM | POA: Diagnosis not present

## 2019-01-21 DIAGNOSIS — J9602 Acute respiratory failure with hypercapnia: Secondary | ICD-10-CM | POA: Diagnosis not present

## 2019-01-21 DIAGNOSIS — F17210 Nicotine dependence, cigarettes, uncomplicated: Secondary | ICD-10-CM | POA: Diagnosis present

## 2019-01-21 DIAGNOSIS — B9689 Other specified bacterial agents as the cause of diseases classified elsewhere: Secondary | ICD-10-CM | POA: Diagnosis not present

## 2019-01-21 DIAGNOSIS — R05 Cough: Secondary | ICD-10-CM

## 2019-01-21 DIAGNOSIS — E871 Hypo-osmolality and hyponatremia: Secondary | ICD-10-CM | POA: Diagnosis present

## 2019-01-21 DIAGNOSIS — Z888 Allergy status to other drugs, medicaments and biological substances status: Secondary | ICD-10-CM

## 2019-01-21 DIAGNOSIS — R569 Unspecified convulsions: Secondary | ICD-10-CM | POA: Diagnosis not present

## 2019-01-21 DIAGNOSIS — J439 Emphysema, unspecified: Secondary | ICD-10-CM | POA: Diagnosis present

## 2019-01-21 DIAGNOSIS — Z789 Other specified health status: Secondary | ICD-10-CM

## 2019-01-21 DIAGNOSIS — K4090 Unilateral inguinal hernia, without obstruction or gangrene, not specified as recurrent: Secondary | ICD-10-CM | POA: Diagnosis present

## 2019-01-21 HISTORY — DX: Bipolar disorder, unspecified: F31.9

## 2019-01-21 LAB — URINALYSIS, ROUTINE W REFLEX MICROSCOPIC
Bilirubin Urine: NEGATIVE
Glucose, UA: 50 mg/dL — AB
Ketones, ur: 20 mg/dL — AB
Nitrite: NEGATIVE
Protein, ur: 100 mg/dL — AB
RBC / HPF: >50 RBC/hpf — ABNORMAL HIGH (ref 0–5)
Specific Gravity, Urine: 1.017 (ref 1.005–1.030)
pH: 6 (ref 5.0–8.0)

## 2019-01-21 LAB — COMPREHENSIVE METABOLIC PANEL
ALT: 49 U/L — ABNORMAL HIGH (ref 0–44)
AST: 62 U/L — ABNORMAL HIGH (ref 15–41)
Albumin: 2 g/dL — ABNORMAL LOW (ref 3.5–5.0)
Alkaline Phosphatase: 96 U/L (ref 38–126)
Anion gap: 12 (ref 5–15)
BUN: 16 mg/dL (ref 8–23)
CO2: 24 mmol/L (ref 22–32)
Calcium: 8 mg/dL — ABNORMAL LOW (ref 8.9–10.3)
Chloride: 120 mmol/L — ABNORMAL HIGH (ref 98–111)
Creatinine, Ser: 0.96 mg/dL (ref 0.61–1.24)
GFR calc Af Amer: >60 mL/min (ref >60)
GFR calc non Af Amer: >60 mL/min (ref >60)
Glucose, Bld: 74 mg/dL (ref 70–99)
Potassium: 4.5 mmol/L (ref 3.5–5.1)
Sodium: 156 mmol/L — ABNORMAL HIGH (ref 135–145)
Total Bilirubin: 1.1 mg/dL (ref 0.3–1.2)
Total Protein: 5.3 g/dL — ABNORMAL LOW (ref 6.5–8.1)

## 2019-01-21 LAB — ETHANOL: Alcohol, Ethyl (B): <10 mg/dL (ref <10)

## 2019-01-21 LAB — AMMONIA: Ammonia: 38 umol/L — ABNORMAL HIGH (ref 9–35)

## 2019-01-21 LAB — LITHIUM LEVEL: Lithium Lvl: <0.06 mmol/L — ABNORMAL LOW (ref 0.60–1.20)

## 2019-01-21 LAB — CBC
HCT: 46.4 % (ref 39.0–52.0)
Hemoglobin: 13.4 g/dL (ref 13.0–17.0)
MCH: 26.4 pg (ref 26.0–34.0)
MCHC: 28.9 g/dL — ABNORMAL LOW (ref 30.0–36.0)
MCV: 91.5 fL (ref 80.0–100.0)
Platelets: 81 10*3/uL — ABNORMAL LOW (ref 150–400)
RBC: 5.07 MIL/uL (ref 4.22–5.81)
RDW: 18.2 % — ABNORMAL HIGH (ref 11.5–15.5)
WBC: 7.5 10*3/uL (ref 4.0–10.5)
nRBC: 2 % — ABNORMAL HIGH (ref 0.0–0.2)

## 2019-01-21 LAB — I-STAT CHEM 8, ED
BUN: 19 mg/dL (ref 8–23)
Calcium, Ion: 1.04 mmol/L — ABNORMAL LOW (ref 1.15–1.40)
Chloride: 119 mmol/L — ABNORMAL HIGH (ref 98–111)
Creatinine, Ser: 0.8 mg/dL (ref 0.61–1.24)
Glucose, Bld: 71 mg/dL (ref 70–99)
HCT: 44 % (ref 39.0–52.0)
Hemoglobin: 15 g/dL (ref 13.0–17.0)
Potassium: 5 mmol/L (ref 3.5–5.1)
Sodium: 156 mmol/L — ABNORMAL HIGH (ref 135–145)
TCO2: 27 mmol/L (ref 22–32)

## 2019-01-21 LAB — HIV ANTIBODY (ROUTINE TESTING W REFLEX): HIV Screen 4th Generation wRfx: NONREACTIVE

## 2019-01-21 LAB — TSH: TSH: 1.212 u[IU]/mL (ref 0.350–4.500)

## 2019-01-21 LAB — POCT I-STAT 7, (LYTES, BLD GAS, ICA,H+H)
Acid-Base Excess: 2 mmol/L (ref 0.0–2.0)
Bicarbonate: 28.1 mmol/L — ABNORMAL HIGH (ref 20.0–28.0)
Calcium, Ion: 1.18 mmol/L (ref 1.15–1.40)
HCT: 38 % — ABNORMAL LOW (ref 39.0–52.0)
Hemoglobin: 12.9 g/dL — ABNORMAL LOW (ref 13.0–17.0)
O2 Saturation: 100 %
Patient temperature: 83.9
Potassium: 3.2 mmol/L — ABNORMAL LOW (ref 3.5–5.1)
Sodium: 156 mmol/L — ABNORMAL HIGH (ref 135–145)
TCO2: 30 mmol/L (ref 22–32)
pCO2 arterial: 34.6 mmHg (ref 32.0–48.0)
pH, Arterial: 7.481 — ABNORMAL HIGH (ref 7.350–7.450)
pO2, Arterial: 532 mmHg — ABNORMAL HIGH (ref 83.0–108.0)

## 2019-01-21 LAB — RESPIRATORY PANEL BY RT PCR (FLU A&B, COVID)
Influenza A by PCR: NEGATIVE
Influenza B by PCR: NEGATIVE
SARS Coronavirus 2 by RT PCR: NEGATIVE

## 2019-01-21 LAB — SALICYLATE LEVEL: Salicylate Lvl: <7 mg/dL — ABNORMAL LOW (ref 7.0–30.0)

## 2019-01-21 LAB — MAGNESIUM: Magnesium: 2.9 mg/dL — ABNORMAL HIGH (ref 1.7–2.4)

## 2019-01-21 LAB — CBG MONITORING, ED: Glucose-Capillary: 98 mg/dL (ref 70–99)

## 2019-01-21 LAB — HEMOGLOBIN A1C
Hgb A1c MFr Bld: 6.3 % — ABNORMAL HIGH (ref 4.8–5.6)
Mean Plasma Glucose: 134.11 mg/dL

## 2019-01-21 LAB — PROCALCITONIN: Procalcitonin: <0.1 ng/mL

## 2019-01-21 LAB — PROTIME-INR
INR: 1.2 (ref 0.8–1.2)
Prothrombin Time: 14.9 seconds (ref 11.4–15.2)

## 2019-01-21 LAB — SAMPLE TO BLOOD BANK

## 2019-01-21 LAB — LACTIC ACID, PLASMA: Lactic Acid, Venous: 3.7 mmol/L (ref 0.5–1.9)

## 2019-01-21 LAB — TROPONIN I (HIGH SENSITIVITY): Troponin I (High Sensitivity): 12 ng/L (ref <18)

## 2019-01-21 LAB — ACETAMINOPHEN LEVEL: Acetaminophen (Tylenol), Serum: <10 ug/mL — ABNORMAL LOW (ref 10–30)

## 2019-01-21 LAB — CK: Total CK: 202 U/L (ref 49–397)

## 2019-01-21 MED ORDER — ROCURONIUM BROMIDE 50 MG/5ML IV SOLN
INTRAVENOUS | Status: AC | PRN
  Administered 2019-01-21: 100 mg via INTRAVENOUS

## 2019-01-21 MED ORDER — LACTATED RINGERS IV BOLUS
500.0000 mL | Freq: Once | INTRAVENOUS | Status: AC
  Administered 2019-01-22: 500 mL via INTRAVENOUS

## 2019-01-21 MED ORDER — FENTANYL CITRATE (PF) 100 MCG/2ML IJ SOLN
50.0000 ug | INTRAMUSCULAR | Status: DC | PRN
  Administered 2019-01-22 – 2019-01-24 (×3): 50 ug via INTRAVENOUS
  Administered 2019-01-27: 200 ug via INTRAVENOUS
  Administered 2019-01-27: 05:00:00 50 ug via INTRAVENOUS
  Filled 2019-01-21: qty 2
  Filled 2019-01-21: qty 4
  Filled 2019-01-21 (×3): qty 2

## 2019-01-21 MED ORDER — PANTOPRAZOLE SODIUM 40 MG PO PACK
40.0000 mg | PACK | Freq: Every day | ORAL | Status: DC
  Administered 2019-01-22 – 2019-02-12 (×22): 40 mg
  Filled 2019-01-21 (×24): qty 20

## 2019-01-21 MED ORDER — INSULIN ASPART 100 UNIT/ML ~~LOC~~ SOLN
2.0000 [IU] | SUBCUTANEOUS | Status: DC
  Administered 2019-01-22: 4 [IU] via SUBCUTANEOUS
  Administered 2019-01-23: 6 [IU] via SUBCUTANEOUS
  Administered 2019-01-23: 2 [IU] via SUBCUTANEOUS
  Administered 2019-01-23 (×4): 4 [IU] via SUBCUTANEOUS
  Administered 2019-01-24: 04:00:00 6 [IU] via SUBCUTANEOUS
  Administered 2019-01-24: 4 [IU] via SUBCUTANEOUS

## 2019-01-21 MED ORDER — SODIUM CHLORIDE 0.9 % IV SOLN: 2.0000 g | Freq: Three times a day (TID) | INTRAVENOUS | Status: DC

## 2019-01-21 MED ORDER — NOREPINEPHRINE 4 MG/250ML-% IV SOLN
0.0000 ug/min | INTRAVENOUS | Status: DC
  Administered 2019-01-22: 20 ug/min via INTRAVENOUS
  Administered 2019-01-22: 2 ug/min via INTRAVENOUS
  Filled 2019-01-21 (×3): qty 250

## 2019-01-21 MED ORDER — FREE WATER: 200.0000 mL | Freq: Three times a day (TID) | Status: DC

## 2019-01-21 MED ORDER — EPINEPHRINE 1 MG/10ML IJ SOSY
PREFILLED_SYRINGE | INTRAMUSCULAR | Status: AC | PRN
  Administered 2019-01-21: 0.2 mg via INTRAVENOUS
  Administered 2019-01-21: .05 mg via INTRAVENOUS

## 2019-01-21 MED ORDER — FENTANYL CITRATE (PF) 100 MCG/2ML IJ SOLN
INTRAMUSCULAR | Status: AC
  Filled 2019-01-21: qty 2

## 2019-01-21 MED ORDER — SODIUM CHLORIDE 0.9 % IV SOLN
2.0000 g | Freq: Once | INTRAVENOUS | Status: AC
  Administered 2019-01-22: 2 g via INTRAVENOUS
  Filled 2019-01-21: qty 2

## 2019-01-21 MED ORDER — VANCOMYCIN HCL 2000 MG/400ML IV SOLN
2000.0000 mg | Freq: Once | INTRAVENOUS | Status: DC
  Administered 2019-01-22: 2000 mg via INTRAVENOUS
  Filled 2019-01-21: qty 400

## 2019-01-21 MED ORDER — FENTANYL CITRATE (PF) 100 MCG/2ML IJ SOLN
50.0000 ug | INTRAMUSCULAR | Status: AC | PRN
  Administered 2019-01-25 – 2019-01-26 (×3): 50 ug via INTRAVENOUS
  Filled 2019-01-21 (×3): qty 2

## 2019-01-21 MED ORDER — ETOMIDATE 2 MG/ML IV SOLN
INTRAVENOUS | Status: AC | PRN
  Administered 2019-01-21: 30 mg via INTRAVENOUS

## 2019-01-21 MED ORDER — SODIUM CHLORIDE 0.9 % IV SOLN
INTRAVENOUS | Status: AC | PRN
  Administered 2019-01-21 (×2): 1000 mL via INTRAVENOUS

## 2019-01-21 MED ORDER — VANCOMYCIN HCL 1500 MG/300ML IV SOLN
1500.0000 mg | Freq: Two times a day (BID) | INTRAVENOUS | Status: DC
  Filled 2019-01-21: qty 300

## 2019-01-21 MED ORDER — EPINEPHRINE PF 1 MG/ML IJ SOLN
0.5000 ug/min | INTRAVENOUS | Status: DC
  Administered 2019-01-21: 5 ug/min via INTRAVENOUS
  Administered 2019-01-22: 10 ug/min via INTRAVENOUS
  Filled 2019-01-21 (×2): qty 4
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
## 2019-01-21 NOTE — ED Notes (Signed)
Called lab, they will start to process.

## 2019-01-21 NOTE — ED Notes (Signed)
Provider going to speak to son

## 2019-01-21 NOTE — ED Notes (Signed)
Per provider, pt is not stable to go to CT, wants to wait for critical care.
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
## 2019-01-21 NOTE — ED Notes (Signed)
Critical care bedside 
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
## 2019-01-21 NOTE — Progress Notes (Signed)
Orthopedic Tech Progress Note Patient Details:  Marc [name] Mar 02, 1955 [id number] Pt didn't need no assist at this time. Ortho Devices Type of Ortho Device: Other (comment)       Tawni Carnes [facility] 01/21/2019, 9:03 PM

## 2019-01-22 ENCOUNTER — Inpatient Hospital Stay (HOSPITAL_COMMUNITY): Payer: Medicare Other

## 2019-01-22 DIAGNOSIS — I9589 Other hypotension: Secondary | ICD-10-CM

## 2019-01-22 LAB — MAGNESIUM
Magnesium: 2.4 mg/dL (ref 1.7–2.4)
Magnesium: 2.4 mg/dL (ref 1.7–2.4)
Magnesium: 2.4 mg/dL (ref 1.7–2.4)

## 2019-01-22 LAB — CBC
HCT: 38.7 % — ABNORMAL LOW (ref 39.0–52.0)
Hemoglobin: 11.2 g/dL — ABNORMAL LOW (ref 13.0–17.0)
MCH: 26.2 pg (ref 26.0–34.0)
MCHC: 28.9 g/dL — ABNORMAL LOW (ref 30.0–36.0)
MCV: 90.4 fL (ref 80.0–100.0)
Platelets: 120 10*3/uL — ABNORMAL LOW (ref 150–400)
RBC: 4.28 MIL/uL (ref 4.22–5.81)
RDW: 17.5 % — ABNORMAL HIGH (ref 11.5–15.5)
WBC: 7.8 10*3/uL (ref 4.0–10.5)
nRBC: 2.9 % — ABNORMAL HIGH (ref 0.0–0.2)

## 2019-01-22 LAB — HEPATIC FUNCTION PANEL
ALT: 46 U/L — ABNORMAL HIGH (ref 0–44)
AST: 57 U/L — ABNORMAL HIGH (ref 15–41)
Albumin: 1.6 g/dL — ABNORMAL LOW (ref 3.5–5.0)
Alkaline Phosphatase: 83 U/L (ref 38–126)
Bilirubin, Direct: 0.1 mg/dL (ref 0.0–0.2)
Indirect Bilirubin: 1.1 mg/dL — ABNORMAL HIGH (ref 0.3–0.9)
Total Bilirubin: 1.2 mg/dL (ref 0.3–1.2)
Total Protein: 4.5 g/dL — ABNORMAL LOW (ref 6.5–8.1)

## 2019-01-22 LAB — GLUCOSE, CAPILLARY
Glucose-Capillary: 115 mg/dL — ABNORMAL HIGH (ref 70–99)
Glucose-Capillary: 122 mg/dL — ABNORMAL HIGH (ref 70–99)
Glucose-Capillary: 163 mg/dL — ABNORMAL HIGH (ref 70–99)
Glucose-Capillary: 197 mg/dL — ABNORMAL HIGH (ref 70–99)
Glucose-Capillary: 227 mg/dL — ABNORMAL HIGH (ref 70–99)
Glucose-Capillary: 35 mg/dL — CL (ref 70–99)
Glucose-Capillary: 42 mg/dL — CL (ref 70–99)

## 2019-01-22 LAB — CBG MONITORING, ED: Glucose-Capillary: 71 mg/dL (ref 70–99)

## 2019-01-22 LAB — RAPID URINE DRUG SCREEN, HOSP PERFORMED
Amphetamines: NOT DETECTED
Barbiturates: NOT DETECTED
Benzodiazepines: NOT DETECTED
Cocaine: NOT DETECTED
Opiates: NOT DETECTED
Tetrahydrocannabinol: NOT DETECTED

## 2019-01-22 LAB — RESPIRATORY PANEL BY RT PCR (FLU A&B, COVID)
Influenza A by PCR: NEGATIVE
Influenza B by PCR: NEGATIVE
SARS Coronavirus 2 by RT PCR: NEGATIVE

## 2019-01-22 LAB — PROTIME-INR
INR: 1.2 (ref 0.8–1.2)
Prothrombin Time: 14.8 seconds (ref 11.4–15.2)

## 2019-01-22 LAB — BASIC METABOLIC PANEL
Anion gap: 16 — ABNORMAL HIGH (ref 5–15)
Anion gap: 11 (ref 5–15)
BUN: 18 mg/dL (ref 8–23)
BUN: 19 mg/dL (ref 8–23)
CO2: 21 mmol/L — ABNORMAL LOW (ref 22–32)
CO2: 22 mmol/L (ref 22–32)
Calcium: 7.5 mg/dL — ABNORMAL LOW (ref 8.9–10.3)
Calcium: 7.5 mg/dL — ABNORMAL LOW (ref 8.9–10.3)
Chloride: 122 mmol/L — ABNORMAL HIGH (ref 98–111)
Chloride: 123 mmol/L — ABNORMAL HIGH (ref 98–111)
Creatinine, Ser: 1.43 mg/dL — ABNORMAL HIGH (ref 0.61–1.24)
Creatinine, Ser: 1.31 mg/dL — ABNORMAL HIGH (ref 0.61–1.24)
GFR calc Af Amer: 60 mL/min — ABNORMAL LOW (ref >60)
GFR calc Af Amer: >60 mL/min (ref >60)
GFR calc non Af Amer: 51 mL/min — ABNORMAL LOW (ref >60)
GFR calc non Af Amer: 57 mL/min — ABNORMAL LOW (ref >60)
Glucose, Bld: 121 mg/dL — ABNORMAL HIGH (ref 70–99)
Glucose, Bld: 190 mg/dL — ABNORMAL HIGH (ref 70–99)
Potassium: 4.1 mmol/L (ref 3.5–5.1)
Potassium: 4.2 mmol/L (ref 3.5–5.1)
Sodium: 159 mmol/L — ABNORMAL HIGH (ref 135–145)
Sodium: 156 mmol/L — ABNORMAL HIGH (ref 135–145)

## 2019-01-22 LAB — PHOSPHORUS
Phosphorus: 4.7 mg/dL — ABNORMAL HIGH (ref 2.5–4.6)
Phosphorus: 3.4 mg/dL (ref 2.5–4.6)
Phosphorus: 3 mg/dL (ref 2.5–4.6)

## 2019-01-22 LAB — CK TOTAL AND CKMB (NOT AT ARMC)
CK, MB: 16.6 ng/mL — ABNORMAL HIGH (ref 0.5–5.0)
Relative Index: 15.8 — ABNORMAL HIGH (ref 0.0–2.5)
Total CK: 105 U/L (ref 49–397)

## 2019-01-22 LAB — PROCALCITONIN: Procalcitonin: <0.1 ng/mL

## 2019-01-22 LAB — FIBRINOGEN: Fibrinogen: 632 mg/dL — ABNORMAL HIGH (ref 210–475)

## 2019-01-22 LAB — URINE CULTURE

## 2019-01-22 LAB — ECHOCARDIOGRAM COMPLETE
Height: 74 in
Weight: 3527.36 oz

## 2019-01-22 LAB — CK: Total CK: 182 U/L (ref 49–397)

## 2019-01-22 LAB — LACTIC ACID, PLASMA
Lactic Acid, Venous: 3.3 mmol/L (ref 0.5–1.9)
Lactic Acid, Venous: 1.9 mmol/L (ref 0.5–1.9)

## 2019-01-22 LAB — CDS SEROLOGY

## 2019-01-22 LAB — SAVE SMEAR(SSMR), FOR PROVIDER SLIDE REVIEW

## 2019-01-22 LAB — MRSA PCR SCREENING: MRSA by PCR: NEGATIVE

## 2019-01-22 LAB — TROPONIN I (HIGH SENSITIVITY): Troponin I (High Sensitivity): 38 ng/L — ABNORMAL HIGH (ref <18)

## 2019-01-22 LAB — LACTATE DEHYDROGENASE: LDH: 257 U/L — ABNORMAL HIGH (ref 98–192)

## 2019-01-22 MED ORDER — MIDAZOLAM HCL 2 MG/2ML IJ SOLN
INTRAMUSCULAR | Status: AC
  Filled 2019-01-22: qty 2

## 2019-01-22 MED ORDER — PERFLUTREN LIPID MICROSPHERE
1.0000 mL | INTRAVENOUS | Status: AC | PRN
  Administered 2019-01-22: 5 mL via INTRAVENOUS
  Filled 2019-01-22: qty 10

## 2019-01-22 MED ORDER — ALBUMIN HUMAN 25 % IV SOLN
25.0000 g | Freq: Four times a day (QID) | INTRAVENOUS | Status: AC
  Administered 2019-01-22 – 2019-01-23 (×4): 25 g via INTRAVENOUS
  Filled 2019-01-22 (×5): qty 50

## 2019-01-22 MED ORDER — PRO-STAT SUGAR FREE PO LIQD
30.0000 mL | Freq: Two times a day (BID) | ORAL | Status: DC
  Administered 2019-01-22: 30 mL
  Filled 2019-01-22: qty 30

## 2019-01-22 MED ORDER — PROPOFOL 1000 MG/100ML IV EMUL
5.0000 ug/kg/min | INTRAVENOUS | Status: DC
  Administered 2019-01-22: 20 ug/kg/min via INTRAVENOUS
  Administered 2019-01-23 – 2019-01-24 (×4): 5 ug/kg/min via INTRAVENOUS
  Filled 2019-01-22 (×4): qty 100

## 2019-01-22 MED ORDER — SODIUM CHLORIDE 0.9 % IV SOLN
2000.0000 mg | Freq: Once | INTRAVENOUS | Status: AC
  Administered 2019-01-22: 2000 mg via INTRAVENOUS
  Filled 2019-01-22 (×2): qty 20

## 2019-01-22 MED ORDER — CHLORHEXIDINE GLUCONATE CLOTH 2 % EX PADS
6.0000 | MEDICATED_PAD | Freq: Every day | CUTANEOUS | Status: DC
  Administered 2019-01-22 – 2019-02-07 (×16): 6 via TOPICAL

## 2019-01-22 MED ORDER — VITAL HIGH PROTEIN PO LIQD: 1000.0000 mL | ORAL | Status: DC

## 2019-01-22 MED ORDER — HYDROCORTISONE NA SUCCINATE PF 100 MG IJ SOLR
50.0000 mg | Freq: Four times a day (QID) | INTRAMUSCULAR | Status: DC
  Administered 2019-01-22 – 2019-01-24 (×7): 50 mg via INTRAVENOUS
  Filled 2019-01-22 (×7): qty 2

## 2019-01-22 MED ORDER — SODIUM CHLORIDE 0.9% FLUSH: 10.0000 mL | INTRAVENOUS | Status: DC | PRN

## 2019-01-22 MED ORDER — ORAL CARE MOUTH RINSE
15.0000 mL | OROMUCOSAL | Status: DC
  Administered 2019-01-22 – 2019-02-12 (×207): 15 mL via OROMUCOSAL

## 2019-01-22 MED ORDER — SODIUM CHLORIDE 0.9% FLUSH
10.0000 mL | Freq: Two times a day (BID) | INTRAVENOUS | Status: DC
  Administered 2019-01-22 – 2019-01-28 (×6): 10 mL

## 2019-01-22 MED ORDER — DEXTROSE 50 % IV SOLN
INTRAVENOUS | Status: AC
  Filled 2019-01-22: qty 50

## 2019-01-22 MED ORDER — VITAL AF 1.2 CAL PO LIQD
1000.0000 mL | ORAL | Status: DC
  Administered 2019-01-22 – 2019-01-27 (×5): 1000 mL
  Filled 2019-01-22: qty 1000

## 2019-01-22 MED ORDER — FREE WATER
400.0000 mL | Freq: Three times a day (TID) | Status: DC
  Administered 2019-01-22 – 2019-01-27 (×15): 400 mL

## 2019-01-22 MED ORDER — VASOPRESSIN 20 UNIT/ML IV SOLN
0.0300 [IU]/min | INTRAVENOUS | Status: DC
  Filled 2019-01-22: qty 2

## 2019-01-22 MED ORDER — DEXTROSE 50 % IV SOLN
50.0000 mL | Freq: Once | INTRAVENOUS | Status: AC
  Administered 2019-01-22: 50 mL via INTRAVENOUS

## 2019-01-22 MED ORDER — CHLORHEXIDINE GLUCONATE 0.12% ORAL RINSE (MEDLINE KIT)
15.0000 mL | Freq: Two times a day (BID) | OROMUCOSAL | Status: DC
  Administered 2019-01-22 – 2019-02-09 (×36): 15 mL via OROMUCOSAL

## 2019-01-22 MED ORDER — NOREPINEPHRINE 16 MG/250ML-% IV SOLN
0.0000 ug/min | INTRAVENOUS | Status: DC
  Administered 2019-01-22: 20 ug/min via INTRAVENOUS
  Administered 2019-01-22: 40 ug/min via INTRAVENOUS
  Filled 2019-01-22 (×3): qty 250

## 2019-01-22 MED ORDER — LACTATED RINGERS IV BOLUS
1000.0000 mL | Freq: Once | INTRAVENOUS | Status: AC
  Administered 2019-01-22: 1000 mL via INTRAVENOUS

## 2019-01-22 MED ORDER — DEXTROSE 5 % IV SOLN: INTRAVENOUS | Status: AC

## 2019-01-22 MED ORDER — THIAMINE HCL 100 MG/ML IJ SOLN
500.0000 mg | Freq: Three times a day (TID) | INTRAVENOUS | Status: DC
  Administered 2019-01-22: 500 mg via INTRAVENOUS
  Filled 2019-01-22 (×4): qty 5

## 2019-01-22 MED ORDER — PRO-STAT SUGAR FREE PO LIQD
60.0000 mL | Freq: Two times a day (BID) | ORAL | Status: DC
  Administered 2019-01-22 – 2019-01-28 (×11): 60 mL
  Filled 2019-01-22 (×12): qty 60

## 2019-01-22 MED ORDER — FOLIC ACID 1 MG PO TABS
1.0000 mg | ORAL_TABLET | Freq: Every day | ORAL | Status: DC
  Administered 2019-01-22 – 2019-01-29 (×8): 1 mg via ORAL
  Filled 2019-01-22 (×8): qty 1

## 2019-01-22 MED ORDER — SODIUM CHLORIDE 0.9 % IV SOLN
INTRAVENOUS | Status: DC | PRN
  Administered 2019-01-22 – 2019-01-23 (×2): 250 mL via INTRAVENOUS
  Administered 2019-02-01 – 2019-02-03 (×2): 500 mL via INTRAVENOUS
  Administered 2019-02-05: 250 mL via INTRAVENOUS
  Administered 2019-02-08: 1000 mL via INTRAVENOUS
  Administered 2019-02-14: 250 mL via INTRAVENOUS
  Administered 2019-02-16: 500 mL via INTRAVENOUS
  Administered 2019-02-20: 250 mL via INTRAVENOUS
  Administered 2019-02-21 – 2019-02-23 (×2): 1000 mL via INTRAVENOUS
  Administered 2019-02-25 – 2019-04-06 (×4): 250 mL via INTRAVENOUS

## 2019-01-22 MED ORDER — THIAMINE HCL 100 MG/ML IJ SOLN
500.0000 mg | Freq: Three times a day (TID) | INTRAVENOUS | Status: AC
  Administered 2019-01-23 – 2019-01-25 (×7): 500 mg via INTRAVENOUS
  Filled 2019-01-22 (×8): qty 5

## 2019-01-22 MED ORDER — IOHEXOL 300 MG/ML  SOLN
100.0000 mL | Freq: Once | INTRAMUSCULAR | Status: AC | PRN
  Administered 2019-01-22: 100 mL via INTRAVENOUS

## 2019-01-22 MED ORDER — MIDAZOLAM HCL 2 MG/2ML IJ SOLN
INTRAMUSCULAR | Status: AC
  Administered 2019-01-22: 2 mg via INTRAVENOUS
  Filled 2019-01-22: qty 2

## 2019-01-22 MED ORDER — CHLORHEXIDINE GLUCONATE CLOTH 2 % EX PADS
6.0000 | MEDICATED_PAD | Freq: Every day | CUTANEOUS | Status: DC
  Administered 2019-01-25 – 2019-01-28 (×4): 6 via TOPICAL

## 2019-01-22 MED ORDER — MIDAZOLAM HCL 2 MG/2ML IJ SOLN
2.0000 mg | Freq: Once | INTRAMUSCULAR | Status: AC
  Administered 2019-01-22: 2 mg via INTRAVENOUS

## 2019-01-22 MED ORDER — LEVETIRACETAM IN NACL 1000 MG/100ML IV SOLN
1000.0000 mg | Freq: Two times a day (BID) | INTRAVENOUS | Status: DC
  Administered 2019-01-22 – 2019-01-27 (×10): 1000 mg via INTRAVENOUS
  Filled 2019-01-22 (×12): qty 100

## 2019-01-22 MED FILL — Medication: Qty: 1 | Status: AC

## 2019-01-22 NOTE — Progress Notes (Signed)
A bit hypotensive from all the sedatives. Start vasopressin and hydrocortisone. Albumin Watch for s/s of infection, still not seeing any.

## 2019-01-22 NOTE — ED Notes (Signed)
Critical care providers bedside

## 2019-01-22 NOTE — ED Notes (Signed)
Pt, RN and RT back from MRI

## 2019-01-22 NOTE — Progress Notes (Signed)
MRI images reviewed. 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   EEG is pending.   Electronically signed: Dr. Caryl Pina

## 2019-01-22 NOTE — Progress Notes (Signed)
STAT EEG completed; results pending. Dr Yadav notified. 

## 2019-01-22 NOTE — Progress Notes (Signed)
Initial Nutrition Assessment  DOCUMENTATION CODES:   Not applicable  INTERVENTION:   Tube feeding:  -Vital AF 1.2 @ 45 ml/hr via OGT (1080 ml) -60 ml Prostat BID -Free water flushes 400 ml Q8 hours- per CCM  Provides: 1696 kcals, 141 grams protein, 876 ml free water (2076 ml with flushes)   NUTRITION DIAGNOSIS:   Increased nutrient needs related to acute illness as evidenced by estimated needs.  GOAL:   Patient will meet greater than or equal to 90% of their needs  MONITOR:   Diet advancement, Vent status, Skin, TF tolerance, Weight trends, Labs, I & O's  REASON FOR ASSESSMENT:   Ventilator, Consult Enteral/tube feeding initiation and management  ASSESSMENT:   Patient with PMH significant for reported bipolar disease and DM. Presents this admission with acute encephalopathy from unclear etiology and severe septic shock.   Pt being rewarmed. Started continuous EEG for seizure like activity. Free water/D5 started for hypernatremia. TF to start today. Monitor for refeeding.   Weight history limited in chart. Will utilize EDW of 100 kg. Bilateral lower extremities noted to be dry and cracked.   Patient is currently intubated on ventilator support MV: 10 L/min Temp (24hrs), Avg:91.4 F (33 C), Min:81.5 F (27.5 C), Max:97.8 F (36.6 C)  Propofol: 12 ml/hr- provides 317 kcal from lipids daily    I/O: +9,053 ml since admit UOP: 200 x 24 hrs   Drips: D5@ 100 ml/hr, propofol, levophed, thiamine  Medications: folic acid, SS novolog  Labs: Na 159 (H) Phosphorus 4.7 (H) Cr 1.43-increased   NUTRITION - FOCUSED PHYSICAL EXAM:    Most Recent Value  Orbital Region  No depletion  Upper Arm Region  No depletion  Thoracic and Lumbar Region  Unable to assess  Buccal Region  No depletion  Temple Region  Mild depletion  Clavicle Bone Region  Mild depletion  Clavicle and Acromion Bone Region  No depletion  Scapular Bone Region  Unable to assess  Dorsal Hand  No depletion   Patellar Region  No depletion  Anterior Thigh Region  No depletion  Posterior Calf Region  No depletion  Edema (RD Assessment)  None  Hair  Reviewed  Eyes  Reviewed  Mouth  Reviewed  Skin  Reviewed  Nails  Reviewed     Diet Order:   Diet Order            Diet NPO time specified  Diet effective now              EDUCATION NEEDS:   Not appropriate for education at this time  Skin:  Skin Assessment: Reviewed RN Assessment  Last BM:  PTA  Height:   Ht Readings from Last 1 Encounters:  01/21/19 6\' 2"  (1.88 m)    Weight:   Wt Readings from Last 1 Encounters:  01/21/19 100 kg    Ideal Body Weight:  86.4 kg  BMI:  Body mass index is 28.31 kg/m.  Estimated Nutritional Needs:   Kcal:  1996 kcal  Protein:  140-160 grams  Fluid:  >/= 1.9 L/day   03/21/19 RD, LDN Clinical Nutrition Pager # - [phone]

## 2019-01-22 NOTE — ED Notes (Signed)
RN and Respiratory to CT

## 2019-01-22 NOTE — Care Plan (Signed)
LTM eeg reviewed till 60. No seizures seen, please review final report for details.
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
## 2019-01-22 NOTE — Progress Notes (Signed)
Pt unavailable for STAT EEG at this time; pt moving to [facility] from [facility].
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
## 2019-01-22 NOTE — ED Notes (Signed)
[facility] - son - [phone] - please call w/ any updates

## 2019-01-22 NOTE — Progress Notes (Signed)
LTM started; tested event button; nurse aware.
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
## 2019-01-22 NOTE — Progress Notes (Signed)
PHARMACY - PHYSICIAN COMMUNICATION CRITICAL VALUE ALERT - BLOOD CULTURE IDENTIFICATION (BCID)  Marc [name] is an 64 y.o. male who presented to [facility] on 01/21/2019 with a chief complaint of being found down  Assessment:  One positive blood cultures positve for GPC in clusters - could be contamination  Name of physician (or Provider) Contacted: Informed Marc [name], Pharmacist rounding with CCM team  Current antibiotics: None  Changes to prescribed antibiotics recommended: None   No results found for this or any previous visit.  Marc [name] 01/22/2019  10:33 AM

## 2019-01-22 NOTE — Progress Notes (Signed)
  Echocardiogram 2D Echocardiogram has been performed with Definity.  Gerda Diss 01/22/2019, 2:09 PM

## 2019-01-22 NOTE — Progress Notes (Signed)
Patient transported to CT and back to trauma C without complications.  

## 2019-01-22 NOTE — Progress Notes (Signed)
Patient transported to 2 M without complication.

## 2019-01-22 NOTE — Procedures (Signed)
Patient Name: Marc [name]  MRN: [id number]  Epilepsy Attending: Charlsie Quest  Referring Physician/Provider: Dr. Milon Dikes Date: 02/08/2019 Duration: 23.11 mins  Patient history: 64 year old male with history of bipolar disorder who was found down and noted to be hypothermic, hypotensive and hypoxemic.  As patient gradually became normothermic and sedation paralytics wore off he was noted to have twitching of face and upper all extremities concerning for seizure.  EEG to evaluate for seizure.  Level of alertness: Comatose  AEDs during EEG study: Keppra, propofol (turned off at the beginning of the study)  Technical aspects: This EEG study was done with scalp electrodes positioned according to the 10-20 International system of electrode placement. Electrical activity was acquired at a sampling rate of 500Hz  and reviewed with a high frequency filter of 70Hz  and a low frequency filter of 1Hz . EEG data were recorded continuously and digitally stored.   Description: EEG showed continuous generalized polymorphic 3 to 6 Hz theta-delta slowing.  EEG was reactive to tactile stimulation.  Hyperventilation and photic stimulation were not performed.  Abnormality -Continuous slow, generalized  IMPRESSION: This study is suggestive of severe diffuse encephalopathy, nonspecific to etiology but could be secondary to sedation. No seizures or epileptiform discharges were seen throughout the recording.

## 2019-01-22 NOTE — ED Notes (Signed)
Portable Xray @ bedside

## 2019-01-22 NOTE — Progress Notes (Signed)
PCCM Interval Note  Called to bedside for facial twitching. On exam bilateral facial twitching with nystagmas without gaze preference. Neurology consulted. Versed 2 mg now and start propofol gtt.  Patient also weaned off epi gtt and on levophed. Give additional IVF bolus now  Mechele Collin, M.D. [facility] Pulmonary/Critical Care Medicine 01/22/2019 4:30 AM

## 2019-01-23 ENCOUNTER — Inpatient Hospital Stay (HOSPITAL_COMMUNITY): Payer: Medicare Other

## 2019-01-23 DIAGNOSIS — L309 Dermatitis, unspecified: Secondary | ICD-10-CM | POA: Diagnosis present

## 2019-01-23 DIAGNOSIS — Z9911 Dependence on respirator [ventilator] status: Secondary | ICD-10-CM

## 2019-01-23 DIAGNOSIS — R7881 Bacteremia: Secondary | ICD-10-CM | POA: Diagnosis present

## 2019-01-23 DIAGNOSIS — F319 Bipolar disorder, unspecified: Secondary | ICD-10-CM | POA: Diagnosis present

## 2019-01-23 DIAGNOSIS — J9600 Acute respiratory failure, unspecified whether with hypoxia or hypercapnia: Secondary | ICD-10-CM | POA: Diagnosis present

## 2019-01-23 DIAGNOSIS — B9561 Methicillin susceptible Staphylococcus aureus infection as the cause of diseases classified elsewhere: Secondary | ICD-10-CM

## 2019-01-23 DIAGNOSIS — F17210 Nicotine dependence, cigarettes, uncomplicated: Secondary | ICD-10-CM | POA: Diagnosis present

## 2019-01-23 DIAGNOSIS — R451 Restlessness and agitation: Secondary | ICD-10-CM

## 2019-01-23 LAB — GLUCOSE, CAPILLARY
Glucose-Capillary: 144 mg/dL — ABNORMAL HIGH (ref 70–99)
Glucose-Capillary: 151 mg/dL — ABNORMAL HIGH (ref 70–99)
Glucose-Capillary: 155 mg/dL — ABNORMAL HIGH (ref 70–99)
Glucose-Capillary: 169 mg/dL — ABNORMAL HIGH (ref 70–99)
Glucose-Capillary: 186 mg/dL — ABNORMAL HIGH (ref 70–99)
Glucose-Capillary: 192 mg/dL — ABNORMAL HIGH (ref 70–99)

## 2019-01-23 LAB — MAGNESIUM
Magnesium: 2.4 mg/dL (ref 1.7–2.4)
Magnesium: 2.3 mg/dL (ref 1.7–2.4)

## 2019-01-23 LAB — CBC
HCT: 36.9 % — ABNORMAL LOW (ref 39.0–52.0)
Hemoglobin: 11.2 g/dL — ABNORMAL LOW (ref 13.0–17.0)
MCH: 26.1 pg (ref 26.0–34.0)
MCHC: 30.4 g/dL (ref 30.0–36.0)
MCV: 86 fL (ref 80.0–100.0)
Platelets: 106 10*3/uL — ABNORMAL LOW (ref 150–400)
RBC: 4.29 MIL/uL (ref 4.22–5.81)
RDW: 16.9 % — ABNORMAL HIGH (ref 11.5–15.5)
WBC: 9.7 10*3/uL (ref 4.0–10.5)
nRBC: 1.1 % — ABNORMAL HIGH (ref 0.0–0.2)

## 2019-01-23 LAB — BASIC METABOLIC PANEL
Anion gap: 10 (ref 5–15)
BUN: 21 mg/dL (ref 8–23)
CO2: 26 mmol/L (ref 22–32)
Calcium: 7.6 mg/dL — ABNORMAL LOW (ref 8.9–10.3)
Chloride: 117 mmol/L — ABNORMAL HIGH (ref 98–111)
Creatinine, Ser: 1.33 mg/dL — ABNORMAL HIGH (ref 0.61–1.24)
GFR calc Af Amer: >60 mL/min (ref >60)
GFR calc non Af Amer: 56 mL/min — ABNORMAL LOW (ref >60)
Glucose, Bld: 197 mg/dL — ABNORMAL HIGH (ref 70–99)
Potassium: 3.8 mmol/L (ref 3.5–5.1)
Sodium: 153 mmol/L — ABNORMAL HIGH (ref 135–145)

## 2019-01-23 LAB — PHOSPHORUS
Phosphorus: 2.9 mg/dL (ref 2.5–4.6)
Phosphorus: 2.6 mg/dL (ref 2.5–4.6)

## 2019-01-23 LAB — PROCALCITONIN: Procalcitonin: 3.22 ng/mL

## 2019-01-23 MED ORDER — CEFAZOLIN SODIUM-DEXTROSE 2-4 GM/100ML-% IV SOLN
2.0000 g | Freq: Three times a day (TID) | INTRAVENOUS | Status: DC
  Administered 2019-01-23 – 2019-01-25 (×8): 2 g via INTRAVENOUS
  Filled 2019-01-23 (×10): qty 100

## 2019-01-23 MED ORDER — VANCOMYCIN HCL IN DEXTROSE 1-5 GM/200ML-% IV SOLN
1000.0000 mg | Freq: Two times a day (BID) | INTRAVENOUS | Status: DC
  Administered 2019-01-23 (×2): 1000 mg via INTRAVENOUS
  Filled 2019-01-23 (×3): qty 200

## 2019-01-23 MED ORDER — IPRATROPIUM-ALBUTEROL 0.5-2.5 (3) MG/3ML IN SOLN
3.0000 mL | Freq: Four times a day (QID) | RESPIRATORY_TRACT | Status: DC | PRN
  Administered 2019-01-23 – 2019-01-28 (×3): 3 mL via RESPIRATORY_TRACT
  Filled 2019-01-23 (×5): qty 3

## 2019-01-23 MED ORDER — DEXTROSE 5 % IV SOLN: INTRAVENOUS | Status: AC

## 2019-01-23 NOTE — Progress Notes (Signed)
Subjective: LTM discontinued  Objective: Current vital signs: BP 105/64   Pulse (!) 58   Temp (!) 94.3 F (34.6 C)   Resp 18   Ht 6' 2" (1.88 m)   Wt 97 kg   SpO2 100%   BMI 27.46 kg/m  Vital signs in last 24 hours: Temp:  [91.9 F (33.3 C)-94.3 F (34.6 C)] 94.3 F (34.6 C) (01/14 0600) Pulse Rate:  [45-63] 58 (01/14 0600) Resp:  [16-20] 18 (01/14 0600) BP: (100-134)/(58-78) 105/64 (01/14 0600) SpO2:  [98 %-100 %] 100 % (01/14 1139) FiO2 (%):  [40 %] 40 % (01/14 1139) Weight:  [97 kg] 97 kg (01/14 0500)  Intake/Output from previous day: 01/13 0701 - 01/14 0700 In: 4688 [I.V.:3318.6; NG/GT:500; IV Piggyback:869.4] Out: 870 [Urine:870] Intake/Output this shift: Total I/O In: 150 [NG/GT:150] Out: 55 [Urine:55] Nutritional status:  Diet Order            Diet NPO time specified  Diet effective now             HEENT [place]/AT. Scleral injection Lungs: Intubated.  Ext: Thick, peeling skin to distal BLE  Neurologic Exam: Sedated with 5 mcg/kg/min propofol Ment: No eye opening or purposeful responses to any external stimuli. No posturing. Sedated with 5 mcg/kg/min propofol.  CN: Pupils are equal and pinpoint. No blink to threat. Does not move eyes towards or away from visual stimuli. Eyes slightly exotropic, near the midline, occasional fast down-beating nystagmus bilaterally. No response to eyelid stimulation bilaterally.  Face flaccidly symmetric.  Motor: Flaccid tone in all 4 extremities. No movement of upper extremities to pinch. LLE with minimal dorsiflexion to plantar stimulation. RLE with no movement to plantar stimulation.  Reflexes: Hypoactive x 4. (Sedated with 5 mcg/kg/min propofol)   Lab Results: Results for orders placed or performed during the hospital encounter of 01/21/19 (from the past 48 hour(s))  Respiratory Panel by RT PCR (Flu A&B, Covid) - Nasopharyngeal Swab     Status: None   Collection Time: 01/21/19  8:50 PM   Specimen: Nasopharyngeal Swab   Result Value Ref Range   SARS Coronavirus 2 by RT PCR NEGATIVE NEGATIVE    Comment: (NOTE) SARS-CoV-2 target nucleic acids are NOT DETECTED. The SARS-CoV-2 RNA is generally detectable in upper respiratoy specimens during the acute phase of infection. The lowest concentration of SARS-CoV-2 viral copies this assay can detect is 131 copies/mL. A negative result does not preclude SARS-Cov-2 infection and should not be used as the sole basis for treatment or other patient management decisions. A negative result may occur with  improper specimen collection/handling, submission of specimen other than nasopharyngeal swab, presence of viral mutation(s) within the areas targeted by this assay, and inadequate number of viral copies (<131 copies/mL). A negative result must be combined with clinical observations, patient history, and epidemiological information. The expected result is Negative. Fact Sheet for Patients:  PinkCheek.be Fact Sheet for Healthcare Providers:  GravelBags.it This test is not yet ap proved or cleared by the [place] FDA and  has been authorized for detection and/or diagnosis of SARS-CoV-2 by FDA under an Emergency Use Authorization (EUA). This EUA will remain  in effect (meaning this test can be used) for the duration of the COVID-19 declaration under Section 564(b)(1) of the Act, 21 U.S.C. section 360bbb-3(b)(1), unless the authorization is terminated or revoked sooner.    Influenza A by PCR NEGATIVE NEGATIVE   Influenza B by PCR NEGATIVE NEGATIVE    Comment: (NOTE) The Xpert Xpress SARS-CoV-2/FLU/RSV  assay is intended as an aid in  the diagnosis of influenza from Nasopharyngeal swab specimens and  should not be used as a sole basis for treatment. Nasal washings and  aspirates are unacceptable for Xpert Xpress SARS-CoV-2/FLU/RSV  testing. Fact Sheet for  Patients: PinkCheek.be Fact Sheet for Healthcare Providers: GravelBags.it This test is not yet approved or cleared by the [place] FDA and  has been authorized for detection and/or diagnosis of SARS-CoV-2 by  FDA under an Emergency Use Authorization (EUA). This EUA will remain  in effect (meaning this test can be used) for the duration of the  Covid-19 declaration under Section 564(b)(1) of the Act, 21  U.S.C. section 360bbb-3(b)(1), unless the authorization is  terminated or revoked. Performed at [facility], [place] [address] [place], [place] [address]   I-stat chem 8, ed     Status: Abnormal   Collection Time: 01/21/19  8:53 PM  Result Value Ref Range   Sodium 156 (H) 135 - 145 mmol/L   Potassium 5.0 3.5 - 5.1 mmol/L   Chloride 119 (H) 98 - 111 mmol/L   BUN 19 8 - 23 mg/dL   Creatinine, Ser 0.80 0.61 - 1.24 mg/dL   Glucose, Bld 71 70 - 99 mg/dL   Calcium, Ion 1.04 (L) 1.15 - 1.40 mmol/L   TCO2 27 22 - 32 mmol/L   Hemoglobin 15.0 13.0 - 17.0 g/dL   HCT 44.0 39.0 - 52.0 %  Urinalysis, Routine w reflex microscopic     Status: Abnormal   Collection Time: 01/21/19  9:25 PM  Result Value Ref Range   Color, Urine AMBER (A) YELLOW    Comment: BIOCHEMICALS MAY BE AFFECTED BY COLOR   APPearance TURBID (A) CLEAR   Specific Gravity, Urine 1.017 1.005 - 1.030   pH 6.0 5.0 - 8.0   Glucose, UA 50 (A) NEGATIVE mg/dL   Hgb urine dipstick MODERATE (A) NEGATIVE   Bilirubin Urine NEGATIVE NEGATIVE   Ketones, ur 20 (A) NEGATIVE mg/dL   Protein, ur 100 (A) NEGATIVE mg/dL   Nitrite NEGATIVE NEGATIVE   Leukocytes,Ua SMALL (A) NEGATIVE   RBC / HPF >50 (H) 0 - 5 RBC/hpf   WBC, UA 6-10 0 - 5 WBC/hpf   Bacteria, UA FEW (A) NONE SEEN   Squamous Epithelial / LPF 0-5 0 - 5   Mucus PRESENT    Non Squamous Epithelial 0-5 (A) NONE SEEN    Comment: Performed at [facility], [place] [address], [place], [place] [address]  Urine  rapid drug screen (hosp performed)     Status: None   Collection Time: 01/21/19  9:25 PM  Result Value Ref Range   Opiates NONE DETECTED NONE DETECTED   Cocaine NONE DETECTED NONE DETECTED   Benzodiazepines NONE DETECTED NONE DETECTED   Amphetamines NONE DETECTED NONE DETECTED   Tetrahydrocannabinol NONE DETECTED NONE DETECTED   Barbiturates NONE DETECTED NONE DETECTED    Comment: (NOTE) DRUG SCREEN FOR MEDICAL PURPOSES ONLY.  IF CONFIRMATION IS NEEDED FOR ANY PURPOSE, NOTIFY LAB WITHIN 5 DAYS. LOWEST DETECTABLE LIMITS FOR URINE DRUG SCREEN Drug Class                     Cutoff (ng/mL) Amphetamine and metabolites    1000 Barbiturate and metabolites    200 Benzodiazepine                 388 Tricyclics and metabolites     300 Opiates and metabolites  300 Cocaine and metabolites        300 THC                            50 Performed at [facility], [place] [address] [place], [place] [address]   CDS serology     Status: None   Collection Time: 01/21/19  9:30 PM  Result Value Ref Range   CDS serology specimen      SPECIMEN WILL BE HELD FOR 14 DAYS IF TESTING IS REQUIRED    Comment: Performed at [facility], [place] [address], [place], [place] [address]  Comprehensive metabolic panel     Status: Abnormal   Collection Time: 01/21/19  9:30 PM  Result Value Ref Range   Sodium 156 (H) 135 - 145 mmol/L   Potassium 4.5 3.5 - 5.1 mmol/L   Chloride 120 (H) 98 - 111 mmol/L   CO2 24 22 - 32 mmol/L   Glucose, Bld 74 70 - 99 mg/dL   BUN 16 8 - 23 mg/dL   Creatinine, Ser 0.96 0.61 - 1.24 mg/dL   Calcium 8.0 (L) 8.9 - 10.3 mg/dL   Total Protein 5.3 (L) 6.5 - 8.1 g/dL   Albumin 2.0 (L) 3.5 - 5.0 g/dL   AST 62 (H) 15 - 41 U/L   ALT 49 (H) 0 - 44 U/L   Alkaline Phosphatase 96 38 - 126 U/L   Total Bilirubin 1.1 0.3 - 1.2 mg/dL   GFR calc non Af Amer >60 >60 mL/min   GFR calc Af Amer >60 >60 mL/min   Anion gap 12 5 - 15    Comment: Performed at [facility], [place] [address], [place], [place] [address]  CBC     Status: Abnormal   Collection Time: 01/21/19  9:30 PM  Result Value Ref Range   WBC 7.5 4.0 - 10.5 K/uL   RBC 5.07 4.22 - 5.81 MIL/uL   Hemoglobin 13.4 13.0 - 17.0 g/dL   HCT 46.4 39.0 - 52.0 %   MCV 91.5 80.0 - 100.0 fL   MCH 26.4 26.0 - 34.0 pg   MCHC 28.9 (L) 30.0 - 36.0 g/dL   RDW 18.2 (H) 11.5 - 15.5 %   Platelets 81 (L) 150 - 400 K/uL    Comment: REPEATED TO VERIFY PLATELET COUNT CONFIRMED BY SMEAR SPECIMEN CHECKED FOR CLOTS Immature Platelet Fraction may be clinically indicated, consider ordering this additional test [id number]    nRBC 2.0 (H) 0.0 - 0.2 %    Comment: Performed at [facility], [place] [address] [place], [place] [address]  Ethanol     Status: None   Collection Time: 01/21/19  9:30 PM  Result Value Ref Range   Alcohol, Ethyl (B) <10 <10 mg/dL    Comment: (NOTE) Lowest detectable limit for serum alcohol is 10 mg/dL. For medical purposes only. Performed at [facility], [place] [address], [place], [place] [address]   Protime-INR     Status: None   Collection Time: 01/21/19  9:30 PM  Result Value Ref Range   Prothrombin Time 14.9 11.4 - 15.2 seconds   INR 1.2 0.8 - 1.2    Comment: (NOTE) INR goal varies based on device and disease states. Performed at [facility], [place] [address] [place], [place] [address]   Salicylate level     Status: Abnormal   Collection Time: 01/21/19  9:30 PM  Result Value Ref Range   Salicylate Lvl <5.9 (L) 7.0 - 30.0 mg/dL    Comment: Performed at [facility] [address] [place] [address], [place], [place] [address]  Acetaminophen level     Status: Abnormal   Collection Time: 01/21/19  9:30 PM  Result Value Ref Range   Acetaminophen (Tylenol), Serum <10 (L) 10 - 30 ug/mL    Comment: (NOTE) Therapeutic concentrations vary significantly. A range of 10-30 ug/mL  may be an effective concentration for many patients. However, some  are best treated at concentrations outside of  this range. Acetaminophen concentrations >150 ug/mL at 4 hours after ingestion  and >50 ug/mL at 12 hours after ingestion are often associated with  toxic reactions. Performed at [facility], [place] [address] [place], [place] [address]   Ammonia     Status: Abnormal   Collection Time: 01/21/19  9:30 PM  Result Value Ref Range   Ammonia 38 (H) 9 - 35 umol/L    Comment: Performed at [facility], [place] [address], [place], [place] [address]  CK     Status: None   Collection Time: 01/21/19  9:30 PM  Result Value Ref Range   Total CK 202 49 - 397 U/L    Comment: Performed at [facility], [place] [address], [place], [place] [address]  Urine culture     Status: Abnormal   Collection Time: 01/21/19  9:30 PM   Specimen: Urine, Catheterized  Result Value Ref Range   Specimen Description URINE, CATHETERIZED    Special Requests      NONE Performed at [facility] [address], [place], [place] [address]    Culture MULTIPLE SPECIES PRESENT, SUGGEST RECOLLECTION (A)    Report Status 01/22/2019 FINAL   Sample to Blood Bank     Status: None   Collection Time: 01/21/19 10:00 PM  Result Value Ref Range   Blood Bank Specimen SAMPLE AVAILABLE FOR TESTING    Sample Expiration      01/22/2019,2359 Performed at [facility], [place] [address] [place], [place] [address]   Blood culture (routine x 2)     Status: Abnormal (Preliminary result)   Collection Time: 01/21/19 10:00 PM   Specimen: BLOOD  Result Value Ref Range   Specimen Description BLOOD RIGHT ANTECUBITAL    Special Requests      BOTTLES DRAWN AEROBIC AND ANAEROBIC Blood Culture adequate volume   Culture  Setup Time      GRAM POSITIVE COCCI IN CLUSTERS IN BOTH AEROBIC AND ANAEROBIC BOTTLES CRITICAL RESULT CALLED TO, READ BACK BY AND VERIFIED WITH: Clydene Fake FRENS [phone] [place] Performed at [facility], [place] [address], [place], [place] [address]    Culture STAPHYLOCOCCUS AUREUS (A)    Report  Status PENDING   Lactic acid, plasma     Status: Abnormal   Collection Time: 01/21/19 10:02 PM  Result Value Ref Range   Lactic Acid, Venous 3.7 (HH) 0.5 - 1.9 mmol/L    Comment: CRITICAL RESULT CALLED TO, READ BACK BY AND VERIFIED WITH: Lubertha Sayres 01/21/19 2244 WAYK Performed at [facility], [place] [address], [place], [place] [address]   TSH     Status: None   Collection Time: 01/21/19 10:03 PM  Result Value Ref Range   TSH 1.212 0.350 - 4.500 uIU/mL    Comment: Performed by a 3rd Generation assay with a functional sensitivity of <=0.01  uIU/mL. Performed at [facility], [place] [address], [place], [place] [address]   Fibrinogen     Status: Abnormal   Collection Time: 01/21/19 10:03 PM  Result Value Ref Range   Fibrinogen 632 (H) 210 - 475 mg/dL    Comment: Performed at [facility] [address] [place], [place] [address]  Protime-INR     Status: None   Collection Time: 01/21/19 10:03 PM  Result Value Ref Range   Prothrombin Time 14.8 11.4 - 15.2 seconds   INR 1.2 0.8 - 1.2    Comment: (NOTE) INR goal varies based on device and disease states. Performed at [facility], [place] [address], [place], [place] [address]   Magnesium     Status: Abnormal   Collection Time: 01/21/19 10:04 PM  Result Value Ref Range   Magnesium 2.9 (H) 1.7 - 2.4 mg/dL    Comment: Performed at [facility] [address] [place], [place] [address]  I-STAT 7, (LYTES, BLD GAS, ICA, H+H)     Status: Abnormal   Collection Time: 01/21/19 10:05 PM  Result Value Ref Range   pH, Arterial 7.481 (H) 7.350 - 7.450   pCO2 arterial 34.6 32.0 - 48.0 mmHg   pO2, Arterial 532.0 (H) 83.0 - 108.0 mmHg   Bicarbonate 28.1 (H) 20.0 - 28.0 mmol/L   TCO2 30 22 - 32 mmol/L   O2 Saturation 100.0 %   Acid-Base Excess 2.0 0.0 - 2.0 mmol/L   Sodium 156 (H) 135 - 145 mmol/L   Potassium 3.2 (L) 3.5 - 5.1 mmol/L   Calcium, Ion 1.18 1.15 - 1.40 mmol/L   HCT 38.0 (L) 39.0 - 52.0 %   Hemoglobin  12.9 (L) 13.0 - 17.0 g/dL   Patient temperature 83.9 F    Collection site RADIAL, ALLEN'S TEST ACCEPTABLE    Drawn by RT    Sample type ARTERIAL   Lithium level     Status: Abnormal   Collection Time: 01/21/19 10:25 PM  Result Value Ref Range   Lithium Lvl <0.06 (L) 0.60 - 1.20 mmol/L    Comment: REPEATED TO VERIFY Performed at [facility], [address] [place] [address], [place], [place] [address]   HIV Antibody (routine testing w rflx)     Status: None   Collection Time: 01/21/19 10:31 PM  Result Value Ref Range   HIV Screen 4th Generation wRfx NON REACTIVE NON REACTIVE    Comment: Performed at [facility], [place] [address], [place], [place] [address]  Procalcitonin - Baseline     Status: None   Collection Time: 01/21/19 10:31 PM  Result Value Ref Range   Procalcitonin <0.10 ng/mL    Comment:        Interpretation: PCT (Procalcitonin) <= 0.5 ng/mL: Systemic infection (sepsis) is not likely. Local bacterial infection is possible. (NOTE)       Sepsis PCT Algorithm           Lower Respiratory Tract                                      Infection PCT Algorithm    ----------------------------     ----------------------------         PCT < 0.25 ng/mL                PCT < 0.10 ng/mL  Strongly encourage             Strongly discourage   discontinuation of antibiotics    initiation of antibiotics    ----------------------------     -----------------------------       PCT 0.25 - 0.50 ng/mL            PCT 0.10 - 0.25 ng/mL               OR       >80% decrease in PCT            Discourage initiation of                                            antibiotics      Encourage discontinuation           of antibiotics    ----------------------------     -----------------------------         PCT >= 0.50 ng/mL              PCT 0.26 - 0.50 ng/mL               AND        <80% decrease in PCT             Encourage initiation of                                             antibiotics        Encourage continuation           of antibiotics    ----------------------------     -----------------------------        PCT >= 0.50 ng/mL                  PCT > 0.50 ng/mL               AND         increase in PCT                  Strongly encourage                                      initiation of antibiotics    Strongly encourage escalation           of antibiotics                                     -----------------------------                                           PCT <= 0.25 ng/mL                                                 OR                                        >  80% decrease in PCT                                     Discontinue / Do not initiate                                             antibiotics Performed at [facility], [place] [address] [place], [place] [address]   Troponin I (High Sensitivity)     Status: None   Collection Time: 01/21/19 10:31 PM  Result Value Ref Range   Troponin I (High Sensitivity) 12 <18 ng/L    Comment: (NOTE) Elevated high sensitivity troponin I (hsTnI) values and significant  changes across serial measurements may suggest ACS but many other  chronic and acute conditions are known to elevate hsTnI results.  Refer to the "Links" section for chest pain algorithms and additional  guidance. Performed at [facility], [place] [address], [place], [place] [address]   Hemoglobin A1c     Status: Abnormal   Collection Time: 01/21/19 10:36 PM  Result Value Ref Range   Hgb A1c MFr Bld 6.3 (H) 4.8 - 5.6 %    Comment: (NOTE) Pre diabetes:          5.7%-6.4% Diabetes:              >6.4% Glycemic control for   <7.0% adults with diabetes    Mean Plasma Glucose 134.11 mg/dL    Comment: Performed at [facility] [address], [place], [place] [address]  CBG monitoring, ED     Status: None   Collection Time: 01/21/19 11:18 PM  Result Value Ref Range   Glucose-Capillary 98 70 - 99 mg/dL  Basic metabolic panel     Status:  Abnormal   Collection Time: 01/22/19  4:10 AM  Result Value Ref Range   Sodium 159 (H) 135 - 145 mmol/L   Potassium 4.1 3.5 - 5.1 mmol/L   Chloride 122 (H) 98 - 111 mmol/L   CO2 21 (L) 22 - 32 mmol/L   Glucose, Bld 121 (H) 70 - 99 mg/dL   BUN 18 8 - 23 mg/dL   Creatinine, Ser 1.43 (H) 0.61 - 1.24 mg/dL   Calcium 7.5 (L) 8.9 - 10.3 mg/dL   GFR calc non Af Amer 51 (L) >60 mL/min   GFR calc Af Amer 60 (L) >60 mL/min   Anion gap 16 (H) 5 - 15    Comment: Performed at [facility] [address] [place], [place] [address]  CBC     Status: Abnormal   Collection Time: 01/22/19  4:10 AM  Result Value Ref Range   WBC 7.8 4.0 - 10.5 K/uL   RBC 4.28 4.22 - 5.81 MIL/uL   Hemoglobin 11.2 (L) 13.0 - 17.0 g/dL   HCT 38.7 (L) 39.0 - 52.0 %   MCV 90.4 80.0 - 100.0 fL   MCH 26.2 26.0 - 34.0 pg   MCHC 28.9 (L) 30.0 - 36.0 g/dL   RDW 17.5 (H) 11.5 - 15.5 %   Platelets 120 (L) 150 - 400 K/uL   nRBC 2.9 (H) 0.0 - 0.2 %    Comment: Performed at [facility] [address] [place], [place] [address]  Magnesium  Status: None   Collection Time: 01/22/19  4:10 AM  Result Value Ref Range   Magnesium 2.4 1.7 - 2.4 mg/dL    Comment: Performed at [facility] [address], [place], [place] [address]  Phosphorus     Status: Abnormal   Collection Time: 01/22/19  4:10 AM  Result Value Ref Range   Phosphorus 4.7 (H) 2.5 - 4.6 mg/dL    Comment: Performed at [facility] [address], [place],  [address]  Procalcitonin     Status: None   Collection Time: 01/22/19  4:10 AM  Result Value Ref Range   Procalcitonin <0.10 ng/mL    Comment:        Interpretation: PCT (Procalcitonin) <= 0.5 ng/mL: Systemic infection (sepsis) is not likely. Local bacterial infection is possible. (NOTE)       Sepsis PCT Algorithm           Lower Respiratory Tract                                      Infection PCT Algorithm    ----------------------------      ----------------------------         PCT < 0.25 ng/mL                PCT < 0.10 ng/mL         Strongly encourage             Strongly discourage   discontinuation of antibiotics    initiation of antibiotics    ----------------------------     -----------------------------       PCT 0.25 - 0.50 ng/mL            PCT 0.10 - 0.25 ng/mL               OR       >80% decrease in PCT            Discourage initiation of                                            antibiotics      Encourage discontinuation           of antibiotics    ----------------------------     -----------------------------         PCT >= 0.50 ng/mL              PCT 0.26 - 0.50 ng/mL               AND        <80% decrease in PCT             Encourage initiation of                                             antibiotics       Encourage continuation           of antibiotics    ----------------------------     -----------------------------        PCT >= 0.50 ng/mL  PCT > 0.50 ng/mL               AND         increase in PCT                  Strongly encourage                                      initiation of antibiotics    Strongly encourage escalation           of antibiotics                                     -----------------------------                                           PCT <= 0.25 ng/mL                                                 OR                                        > 80% decrease in PCT                                     Discontinue / Do not initiate                                             antibiotics Performed at [facility], [address], [place], [place] [address]   Hepatic function panel     Status: Abnormal   Collection Time: 01/22/19  4:10 AM  Result Value Ref Range   Total Protein 4.5 (L) 6.5 - 8.1 g/dL   Albumin 1.6 (L) 3.5 - 5.0 g/dL   AST 57 (H) 15 - 41 U/L   ALT 46 (H) 0 - 44 U/L   Alkaline Phosphatase 83 38 - 126 U/L   Total Bilirubin 1.2 0.3 - 1.2  mg/dL   Bilirubin, Direct 0.1 0.0 - 0.2 mg/dL   Indirect Bilirubin 1.1 (H) 0.3 - 0.9 mg/dL    Comment: Performed at [facility] [address], [place], [place] [address]  Troponin I (High Sensitivity)     Status: Abnormal   Collection Time: 01/22/19  4:10 AM  Result Value Ref Range   Troponin I (High Sensitivity) 38 (H) <18 ng/L    Comment: RESULT CALLED TO, READ BACK BY AND VERIFIED WITH: LESLIE SHULAR,RN AT 4332 01/22/2019 BY ZBEECH. (NOTE) Elevated high sensitivity troponin I (hsTnI) values and significant  changes across serial measurements may suggest ACS but many other  chronic and acute conditions are known to elevate hsTnI results.  Refer to the Links section for chest pain algorithms and additional  guidance. Performed at  [facility], [place] [address], [place], [place] [address]   Lactate dehydrogenase     Status: Abnormal   Collection Time: 01/22/19  4:10 AM  Result Value Ref Range   LDH 257 (H) 98 - 192 U/L    Comment: Performed at [facility], [place] [address], [place], [place] [address]  Save Smear     Status: None   Collection Time: 01/22/19  4:10 AM  Result Value Ref Range   Smear Review SMEAR STAINED AND AVAILABLE FOR REVIEW     Comment: Performed at [facility], [place] [address], [place], [place] [address]  CK     Status: None   Collection Time: 01/22/19  4:10 AM  Result Value Ref Range   Total CK 182 49 - 397 U/L    Comment: Performed at [facility], [place] [address] [place], [place] [address]  Lactic acid, plasma     Status: Abnormal   Collection Time: 01/22/19  4:32 AM  Result Value Ref Range   Lactic Acid, Venous 3.3 (HH) 0.5 - 1.9 mmol/L    Comment: CRITICAL VALUE NOTED.  VALUE IS CONSISTENT WITH PREVIOUSLY REPORTED AND CALLED VALUE. Performed at [facility], [place] [address], [place], [place] [address]   CBG monitoring, ED     Status: None   Collection Time: 01/22/19  5:35 AM  Result Value Ref Range    Glucose-Capillary 71 70 - 99 mg/dL  Magnesium     Status: None   Collection Time: 01/22/19  7:28 AM  Result Value Ref Range   Magnesium 2.4 1.7 - 2.4 mg/dL    Comment: Performed at [facility], [place] [address] [place], [place] [address]  Phosphorus     Status: None   Collection Time: 01/22/19  7:28 AM  Result Value Ref Range   Phosphorus 3.4 2.5 - 4.6 mg/dL    Comment: Performed at [facility] [address] [place], [place] [address]  CK total and CKMB (cardiac)not at [facility]     Status: Abnormal   Collection Time: 01/22/19  7:28 AM  Result Value Ref Range   Total CK 105 49 - 397 U/L   CK, MB 16.6 (H) 0.5 - 5.0 ng/mL   Relative Index 15.8 (H) 0.0 - 2.5    Comment: Performed at [facility] [address], [place], [place] [address]  Respiratory Panel by RT PCR (Flu A&B, Covid) - Nasopharyngeal Swab     Status: None   Collection Time: 01/22/19  8:42 AM   Specimen: Nasopharyngeal Swab  Result Value Ref Range   SARS Coronavirus 2 by RT PCR NEGATIVE NEGATIVE    Comment: (NOTE) SARS-CoV-2 target nucleic acids are NOT DETECTED. The SARS-CoV-2 RNA is generally detectable in upper respiratoy specimens during the acute phase of infection. The lowest concentration of SARS-CoV-2 viral copies this assay can detect is 131 copies/mL. A negative result does not preclude SARS-Cov-2 infection and should not be used as the sole basis for treatment or other patient management decisions. A negative result may occur with  improper specimen collection/handling, submission of specimen other than nasopharyngeal swab, presence of viral mutation(s) within the areas targeted by this assay, and inadequate number of viral copies (<131 copies/mL). A negative result must be combined with clinical observations, patient history, and epidemiological information. The expected result is Negative. Fact Sheet for Patients:  PinkCheek.be Fact Sheet for Healthcare  Providers:  GravelBags.it This  test is not yet ap proved or cleared by the [place] and  has been authorized for detection and/or diagnosis of SARS-CoV-2 by FDA under an Emergency Use Authorization (EUA). This EUA will remain  in effect (meaning this test can be used) for the duration of the COVID-19 declaration under Section 564(b)(1) of the Act, 21 U.S.C. section 360bbb-3(b)(1), unless the authorization is terminated or revoked sooner.    Influenza A by PCR NEGATIVE NEGATIVE   Influenza B by PCR NEGATIVE NEGATIVE    Comment: (NOTE) The Xpert Xpress SARS-CoV-2/FLU/RSV assay is intended as an aid in  the diagnosis of influenza from Nasopharyngeal swab specimens and  should not be used as a sole basis for treatment. Nasal washings and  aspirates are unacceptable for Xpert Xpress SARS-CoV-2/FLU/RSV  testing. Fact Sheet for Patients: PinkCheek.be Fact Sheet for Healthcare Providers: GravelBags.it This test is not yet approved or cleared by the [place] FDA and  has been authorized for detection and/or diagnosis of SARS-CoV-2 by  FDA under an Emergency Use Authorization (EUA). This EUA will remain  in effect (meaning this test can be used) for the duration of the  Covid-19 declaration under Section 564(b)(1) of the Act, 21  U.S.C. section 360bbb-3(b)(1), unless the authorization is  terminated or revoked. Performed at [facility], [place] [address], [place], [place] [address]   Lactic acid, plasma     Status: None   Collection Time: 01/22/19  9:20 AM  Result Value Ref Range   Lactic Acid, Venous 1.9 0.5 - 1.9 mmol/L    Comment: Performed at [facility] [address] [place], [place] [address]  Glucose, capillary     Status: Abnormal   Collection Time: 01/22/19  9:28 AM  Result Value Ref Range   Glucose-Capillary 35 (LL) 70 - 99 mg/dL   Comment 1 Notify RN   MRSA PCR  Screening     Status: None   Collection Time: 01/22/19  9:32 AM   Specimen: Nasopharyngeal  Result Value Ref Range   MRSA by PCR NEGATIVE NEGATIVE    Comment:        The GeneXpert MRSA Assay (FDA approved for NASAL specimens only), is one component of a comprehensive MRSA colonization surveillance program. It is not intended to diagnose MRSA infection nor to guide or monitor treatment for MRSA infections. Performed at [facility], [place] [address], [place], [place] [address]   Glucose, capillary     Status: Abnormal   Collection Time: 01/22/19  9:44 AM  Result Value Ref Range   Glucose-Capillary 42 (LL) 70 - 99 mg/dL  Glucose, capillary     Status: Abnormal   Collection Time: 01/22/19 10:09 AM  Result Value Ref Range   Glucose-Capillary 122 (H) 70 - 99 mg/dL  Blood culture (routine x 2)     Status: None (Preliminary result)   Collection Time: 01/22/19 10:21 AM   Specimen: BLOOD RIGHT HAND  Result Value Ref Range   Specimen Description BLOOD RIGHT HAND    Special Requests      BOTTLES DRAWN AEROBIC ONLY Blood Culture results may not be optimal due to an inadequate volume of blood received in culture bottles   Culture      NO GROWTH < 24 HOURS Performed at [facility] [address], [place], [place] [address]    Report Status PENDING   Glucose, capillary     Status: Abnormal   Collection Time: 01/22/19 11:53  AM  Result Value Ref Range   Glucose-Capillary 115 (H) 70 - 99 mg/dL  Basic metabolic panel     Status: Abnormal   Collection Time: 01/22/19  3:00 PM  Result Value Ref Range   Sodium 156 (H) 135 - 145 mmol/L   Potassium 4.2 3.5 - 5.1 mmol/L   Chloride 123 (H) 98 - 111 mmol/L   CO2 22 22 - 32 mmol/L   Glucose, Bld 190 (H) 70 - 99 mg/dL   BUN 19 8 - 23 mg/dL   Creatinine, Ser 1.31 (H) 0.61 - 1.24 mg/dL   Calcium 7.5 (L) 8.9 - 10.3 mg/dL   GFR calc non Af Amer 57 (L) >60 mL/min   GFR calc Af Amer >60 >60 mL/min   Anion gap 11 5 - 15    Comment:  Performed at [facility] [address], [place], [place] [address]  Glucose, capillary     Status: Abnormal   Collection Time: 01/22/19  4:12 PM  Result Value Ref Range   Glucose-Capillary 163 (H) 70 - 99 mg/dL  Magnesium     Status: None   Collection Time: 01/22/19  5:00 PM  Result Value Ref Range   Magnesium 2.4 1.7 - 2.4 mg/dL    Comment: Performed at [facility], [place] [address] [place], [place] [address]  Phosphorus     Status: None   Collection Time: 01/22/19  5:00 PM  Result Value Ref Range   Phosphorus 3.0 2.5 - 4.6 mg/dL    Comment: Performed at [facility] [address] [place], [place] [address]  Glucose, capillary     Status: Abnormal   Collection Time: 01/22/19  8:01 PM  Result Value Ref Range   Glucose-Capillary 197 (H) 70 - 99 mg/dL  Glucose, capillary     Status: Abnormal   Collection Time: 01/22/19 11:45 PM  Result Value Ref Range   Glucose-Capillary 227 (H) 70 - 99 mg/dL  Procalcitonin     Status: None   Collection Time: 01/23/19  4:17 AM  Result Value Ref Range   Procalcitonin 3.22 ng/mL    Comment:        Interpretation: PCT > 2 ng/mL: Systemic infection (sepsis) is likely, unless other causes are known. (NOTE)       Sepsis PCT Algorithm           Lower Respiratory Tract                                      Infection PCT Algorithm    ----------------------------     ----------------------------         PCT < 0.25 ng/mL                PCT < 0.10 ng/mL         Strongly encourage             Strongly discourage   discontinuation of antibiotics    initiation of antibiotics    ----------------------------     -----------------------------       PCT 0.25 - 0.50 ng/mL            PCT 0.10 - 0.25 ng/mL               OR       >80% decrease in PCT  Discourage initiation of                                            antibiotics      Encourage discontinuation           of antibiotics    ----------------------------      -----------------------------         PCT >= 0.50 ng/mL              PCT 0.26 - 0.50 ng/mL               AND       <80% decrease in PCT              Encourage initiation of                                             antibiotics       Encourage continuation           of antibiotics    ----------------------------     -----------------------------        PCT >= 0.50 ng/mL                  PCT > 0.50 ng/mL               AND         increase in PCT                  Strongly encourage                                      initiation of antibiotics    Strongly encourage escalation           of antibiotics                                     -----------------------------                                           PCT <= 0.25 ng/mL                                                 OR                                        > 80% decrease in PCT                                     Discontinue / Do not initiate  antibiotics Performed at [facility], [place] [address] [place], [place] [address]   Magnesium     Status: None   Collection Time: 01/23/19  4:17 AM  Result Value Ref Range   Magnesium 2.4 1.7 - 2.4 mg/dL    Comment: Performed at [facility] [address] [place], [place] [address]  Phosphorus     Status: None   Collection Time: 01/23/19  4:17 AM  Result Value Ref Range   Phosphorus 2.9 2.5 - 4.6 mg/dL    Comment: Performed at [facility] [address], [place], [place] [address]  CBC     Status: Abnormal   Collection Time: 01/23/19  4:17 AM  Result Value Ref Range   WBC 9.7 4.0 - 10.5 K/uL   RBC 4.29 4.22 - 5.81 MIL/uL   Hemoglobin 11.2 (L) 13.0 - 17.0 g/dL   HCT 36.9 (L) 39.0 - 52.0 %   MCV 86.0 80.0 - 100.0 fL   MCH 26.1 26.0 - 34.0 pg   MCHC 30.4 30.0 - 36.0 g/dL   RDW 16.9 (H) 11.5 - 15.5 %   Platelets 106 (L) 150 - 400 K/uL    Comment: REPEATED TO VERIFY PLATELET COUNT CONFIRMED BY SMEAR Immature  Platelet Fraction may be clinically indicated, consider ordering this additional test [id number]    nRBC 1.1 (H) 0.0 - 0.2 %    Comment: Performed at [facility], [place] [address] [place], [name] [address]  Basic metabolic panel     Status: Abnormal   Collection Time: 01/23/19  4:17 AM  Result Value Ref Range   Sodium 153 (H) 135 - 145 mmol/L   Potassium 3.8 3.5 - 5.1 mmol/L   Chloride 117 (H) 98 - 111 mmol/L   CO2 26 22 - 32 mmol/L   Glucose, Bld 197 (H) 70 - 99 mg/dL   BUN 21 8 - 23 mg/dL   Creatinine, Ser 1.33 (H) 0.61 - 1.24 mg/dL   Calcium 7.6 (L) 8.9 - 10.3 mg/dL   GFR calc non Af Amer 56 (L) >60 mL/min   GFR calc Af Amer >60 >60 mL/min   Anion gap 10 5 - 15    Comment: Performed at [facility] [address], [place], [place] [address]  Glucose, capillary     Status: Abnormal   Collection Time: 01/23/19  4:21 AM  Result Value Ref Range   Glucose-Capillary 169 (H) 70 - 99 mg/dL  Glucose, capillary     Status: Abnormal   Collection Time: 01/23/19  7:15 AM  Result Value Ref Range   Glucose-Capillary 151 (H) 70 - 99 mg/dL  Glucose, capillary     Status: Abnormal   Collection Time: 01/23/19 11:07 AM  Result Value Ref Range   Glucose-Capillary 192 (H) 70 - 99 mg/dL    Recent Results (from the past 240 hour(s))  Respiratory Panel by RT PCR (Flu A&B, Covid) - Nasopharyngeal Swab     Status: None   Collection Time: 01/21/19  8:50 PM   Specimen: Nasopharyngeal Swab  Result Value Ref Range Status   SARS Coronavirus 2 by RT PCR NEGATIVE NEGATIVE Final    Comment: (NOTE) SARS-CoV-2 target nucleic acids are NOT DETECTED. The SARS-CoV-2 RNA is generally detectable in upper respiratoy specimens during the acute phase of infection. The lowest concentration of SARS-CoV-2 viral copies this assay can detect is 131 copies/mL. A negative result does not preclude SARS-Cov-2 infection and should not be  used as the sole basis for treatment or other patient management  decisions. A negative result may occur with  improper specimen collection/handling, submission of specimen other than nasopharyngeal swab, presence of viral mutation(s) within the areas targeted by this assay, and inadequate number of viral copies (<131 copies/mL). A negative result must be combined with clinical observations, patient history, and epidemiological information. The expected result is Negative. Fact Sheet for Patients:  PinkCheek.be Fact Sheet for Healthcare Providers:  GravelBags.it This test is not yet ap proved or cleared by the [place] FDA and  has been authorized for detection and/or diagnosis of SARS-CoV-2 by FDA under an Emergency Use Authorization (EUA). This EUA will remain  in effect (meaning this test can be used) for the duration of the COVID-19 declaration under Section 564(b)(1) of the Act, 21 U.S.C. section 360bbb-3(b)(1), unless the authorization is terminated or revoked sooner.    Influenza A by PCR NEGATIVE NEGATIVE Final   Influenza B by PCR NEGATIVE NEGATIVE Final    Comment: (NOTE) The Xpert Xpress SARS-CoV-2/FLU/RSV assay is intended as an aid in  the diagnosis of influenza from Nasopharyngeal swab specimens and  should not be used as a sole basis for treatment. Nasal washings and  aspirates are unacceptable for Xpert Xpress SARS-CoV-2/FLU/RSV  testing. Fact Sheet for Patients: PinkCheek.be Fact Sheet for Healthcare Providers: GravelBags.it This test is not yet approved or cleared by the [place] FDA and  has been authorized for detection and/or diagnosis of SARS-CoV-2 by  FDA under an Emergency Use Authorization (EUA). This EUA will remain  in effect (meaning this test can be used) for the duration of the  Covid-19 declaration under Section 564(b)(1) of the Act, 21  U.S.C. section 360bbb-3(b)(1), unless the authorization  is  terminated or revoked. Performed at [facility], [place] [address] [place], [place] [address]   Urine culture     Status: Abnormal   Collection Time: 01/21/19  9:30 PM   Specimen: Urine, Catheterized  Result Value Ref Range Status   Specimen Description URINE, CATHETERIZED  Final   Special Requests   Final    NONE Performed at [facility], [place] [address], [place], [place] [address]    Culture MULTIPLE SPECIES PRESENT, SUGGEST RECOLLECTION (A)  Final   Report Status 01/22/2019 FINAL  Final  Blood culture (routine x 2)     Status: Abnormal (Preliminary result)   Collection Time: 01/21/19 10:00 PM   Specimen: BLOOD  Result Value Ref Range Status   Specimen Description BLOOD RIGHT ANTECUBITAL  Final   Special Requests   Final    BOTTLES DRAWN AEROBIC AND ANAEROBIC Blood Culture adequate volume   Culture  Setup Time   Final    GRAM POSITIVE COCCI IN CLUSTERS IN BOTH AEROBIC AND ANAEROBIC BOTTLES CRITICAL RESULT CALLED TO, READ BACK BY AND VERIFIED WITH: Clydene Fake FRENS [id number] [place] Performed at [facility], [place] [address], [place], [place] [address]    Culture STAPHYLOCOCCUS AUREUS (A)  Final   Report Status PENDING  Incomplete  Respiratory Panel by RT PCR (Flu A&B, Covid) - Nasopharyngeal Swab     Status: None   Collection Time: 01/22/19  8:42 AM   Specimen: Nasopharyngeal Swab  Result Value Ref Range Status   SARS Coronavirus 2 by RT PCR NEGATIVE NEGATIVE Final    Comment: (NOTE) SARS-CoV-2 target nucleic acids are NOT DETECTED. The SARS-CoV-2 RNA is generally detectable in upper respiratoy specimens during the acute  phase of infection. The lowest concentration of SARS-CoV-2 viral copies this assay can detect is 131 copies/mL. A negative result does not preclude SARS-Cov-2 infection and should not be used as the sole basis for treatment or other patient management decisions. A negative result may occur with  improper specimen collection/handling,  submission of specimen other than nasopharyngeal swab, presence of viral mutation(s) within the areas targeted by this assay, and inadequate number of viral copies (<131 copies/mL). A negative result must be combined with clinical observations, patient history, and epidemiological information. The expected result is Negative. Fact Sheet for Patients:  PinkCheek.be Fact Sheet for Healthcare Providers:  GravelBags.it This test is not yet ap proved or cleared by the [place] FDA and  has been authorized for detection and/or diagnosis of SARS-CoV-2 by FDA under an Emergency Use Authorization (EUA). This EUA will remain  in effect (meaning this test can be used) for the duration of the COVID-19 declaration under Section 564(b)(1) of the Act, 21 U.S.C. section 360bbb-3(b)(1), unless the authorization is terminated or revoked sooner.    Influenza A by PCR NEGATIVE NEGATIVE Final   Influenza B by PCR NEGATIVE NEGATIVE Final    Comment: (NOTE) The Xpert Xpress SARS-CoV-2/FLU/RSV assay is intended as an aid in  the diagnosis of influenza from Nasopharyngeal swab specimens and  should not be used as a sole basis for treatment. Nasal washings and  aspirates are unacceptable for Xpert Xpress SARS-CoV-2/FLU/RSV  testing. Fact Sheet for Patients: PinkCheek.be Fact Sheet for Healthcare Providers: GravelBags.it This test is not yet approved or cleared by the [place] FDA and  has been authorized for detection and/or diagnosis of SARS-CoV-2 by  FDA under an Emergency Use Authorization (EUA). This EUA will remain  in effect (meaning this test can be used) for the duration of the  Covid-19 declaration under Section 564(b)(1) of the Act, 21  U.S.C. section 360bbb-3(b)(1), unless the authorization is  terminated or revoked. Performed at [facility], [place] [address],  [place], [place] [address]   MRSA PCR Screening     Status: None   Collection Time: 01/22/19  9:32 AM   Specimen: Nasopharyngeal  Result Value Ref Range Status   MRSA by PCR NEGATIVE NEGATIVE Final    Comment:        The GeneXpert MRSA Assay (FDA approved for NASAL specimens only), is one component of a comprehensive MRSA colonization surveillance program. It is not intended to diagnose MRSA infection nor to guide or monitor treatment for MRSA infections. Performed at [facility], [place] [address] [place], [place] [address]   Blood culture (routine x 2)     Status: None (Preliminary result)   Collection Time: 01/22/19 10:21 AM   Specimen: BLOOD RIGHT HAND  Result Value Ref Range Status   Specimen Description BLOOD RIGHT HAND  Final   Special Requests   Final    BOTTLES DRAWN AEROBIC ONLY Blood Culture results may not be optimal due to an inadequate volume of blood received in culture bottles   Culture   Final    NO GROWTH < 24 HOURS Performed at [facility], [place] [address] [place],  [address]    Report Status PENDING  Incomplete    Lipid Panel No results for input(s): CHOL, TRIG, HDL, CHOLHDL, VLDL, LDLCALC in the last 72 hours.  Studies/Results: DG Abd 1 View  Result Date: 01/22/2019 CLINICAL DATA:  Orogastric tube placement. EXAM: ABDOMEN - 1 VIEW COMPARISON:  None.  FINDINGS: The bowel gas pattern is normal. Distal tip of enteric tube is seen in proximal stomach. No radio-opaque calculi or other significant radiographic abnormality are seen. IMPRESSION: Distal tip of enteric tube seen in proximal stomach. Electronically Signed   By: Marijo Conception M.D.   On: 01/22/2019 09:55   CT Head Wo Contrast  Result Date: 01/22/2019 CLINICAL DATA:  Patient found down EXAM: CT HEAD WITHOUT CONTRAST TECHNIQUE: Contiguous axial images were obtained from the base of the skull through the vertex without intravenous contrast. COMPARISON:  None. FINDINGS: Brain: No evidence  of acute territorial infarction, hemorrhage, hydrocephalus,extra-axial collection or mass lesion/mass effect. The ventricles are normal in size and contour. There is mild low-attenuation changes in the deep white matter consistent with small vessel ischemia. Vascular: No hyperdense vessel or unexpected calcification. Skull: The skull is intact. No fracture or focal lesion identified. Sinuses/Orbits: Fluid seen within the right maxillary sinus and mucosal thickening of the left maxillary sinus. The orbits and globes intact. Other: None Face: Osseous: No acute fracture or other significant osseous abnormality.The nasal bone, mandibles, zygomatic arches and pterygoid plates are intact. Orbits: No fracture identified. Unremarkable appearance of globes and orbits. Sinuses: Mastoid air cells are unremarkable. There is mucosal thickening of the left maxillary sinus and near complete opacification of the right maxillary sinus. Ethmoid air cell mucosal thickening is noted. Soft tissues: Small well loculated cyst seen overlying the left mandible. Limited intracranial: No acute findings. Cervical spine: Alignment: There is straightening of the normal cervical lordosis. Skull base and vertebrae: Visualized skull base is intact. No atlanto-occipital dissociation. The vertebral body heights are well maintained. No fracture or pathologic osseous lesion seen. The patient is status post ACDF at C3-C4. Solid interbody fusion is seen. No periprosthetic lucency or fracture is identified. Soft tissues and spinal canal: The visualized paraspinal soft tissues are unremarkable. No prevertebral soft tissue swelling is seen. The spinal canal is grossly unremarkable, no large epidural collection or significant canal narrowing. Disc levels: Multilevel cervical spine spondylosis is seen most notable at C5-C6 and C6-C7. Upper chest: Centrilobular emphysematous changes are seen. Small amount of subcutaneous emphysema seen within the upper chest  wall soft tissues. Other: None IMPRESSION: No acute intracranial abnormality. No acute facial fracture. Bilateral maxillary sinusitis. No acute fracture or malalignment of the spine. Status post ACDF at C3-C4 without acute complication. Electronically Signed   By: Prudencio Pair M.D.   On: 01/22/2019 04:24   CT Chest W Contrast  Result Date: 01/22/2019 CLINICAL DATA:  Altered mental status found down EXAM: CT CHEST WITH CONTRAST TECHNIQUE: Multidetector CT imaging of the chest was performed during intravenous contrast administration. CONTRAST:  151m OMNIPAQUE IOHEXOL 300 MG/ML  SOLN COMPARISON:  None. FINDINGS: Cardiovascular: Normal heart size. No significant pericardial fluid/thickening. Coronary artery calcifications. Great vessels are normal in course and caliber. No evidence of acute thoracic aortic injury. No central pulmonary emboli. Mediastinum/Nodes: No pneumomediastinum. No mediastinal hematoma. Small amount of fluid in the esophagus. No axillary, mediastinal or hilar lymphadenopathy. Lungs/Pleura:Centrilobular emphysematous changes seen at both lung apices. Bibasilar dependent atelectasis or scarring. No pneumothorax. No pleural effusion. Endotracheal tube tip is seen 2 cm above the level of the carina. Musculoskeletal: No fracture seen in the thorax. Small amount of subcutaneous emphysema within the anterior chest wall soft tissues. Abdomen/pelvis : Hepatobiliary: Homogeneous hepatic attenuation without traumatic injury. No focal lesion. Gallbladder physiologically distended, no calcified stone. No biliary dilatation. Pancreas: No evidence for traumatic injury. Portions are partially obscured by  adjacent bowel loops and paucity of intra-abdominal fat. No ductal dilatation or inflammation. Spleen: Homogeneous attenuation without traumatic injury. Normal in size. Adrenals/Urinary Tract: No adrenal hemorrhage. Kidneys demonstrate symmetric enhancement and excretion on delayed phase imaging. No evidence  or renal injury. Ureters are well opacified proximal through mid portion. Bladder is decompressed with a Foley catheter. Stomach/Bowel: Suboptimally assessed without enteric contrast, allowing for this, no evidence of bowel injury. Stomach physiologically distended. Mildly prominent air-filled loop of transverse colon is seen. No focal bowel wall thickening however is noted. Scattered colonic diverticula noted without diverticulitis. No evidence of mesenteric hematoma. No free air free fluid. Vascular/Lymphatic: No acute vascular injury. The abdominal aorta and IVC are intact. No evidence of retroperitoneal, abdominal, or pelvic adenopathy. Scattered aortic atherosclerosis is seen. Mild ectasia of the infrarenal abdominal aorta is seen measuring maximum diameter of 2.7 cm. Reproductive: No acute abnormality. Other: Left inguinal hernia seen containing loops of colon. Musculoskeletal: No acute fracture of the lumbar spine or bony pelvis. IMPRESSION: No acute intrathoracic, abdominal, or pelvic pathology to explain the patient's symptoms. Aortic Atherosclerosis (ICD10-I70.0). Emphysema (ICD10-J43.9). Mildly prominent air-filled loop of transverse colon. However no focal bowel wall thickening or narrowing. Diverticulosis without diverticulitis. Electronically Signed   By: Prudencio Pair M.D.   On: 01/22/2019 04:33   CT Cervical Spine Wo Contrast  Result Date: 01/22/2019 CLINICAL DATA:  Patient found down EXAM: CT HEAD WITHOUT CONTRAST TECHNIQUE: Contiguous axial images were obtained from the base of the skull through the vertex without intravenous contrast. COMPARISON:  None. FINDINGS: Brain: No evidence of acute territorial infarction, hemorrhage, hydrocephalus,extra-axial collection or mass lesion/mass effect. The ventricles are normal in size and contour. There is mild low-attenuation changes in the deep white matter consistent with small vessel ischemia. Vascular: No hyperdense vessel or unexpected calcification.  Skull: The skull is intact. No fracture or focal lesion identified. Sinuses/Orbits: Fluid seen within the right maxillary sinus and mucosal thickening of the left maxillary sinus. The orbits and globes intact. Other: None Face: Osseous: No acute fracture or other significant osseous abnormality.The nasal bone, mandibles, zygomatic arches and pterygoid plates are intact. Orbits: No fracture identified. Unremarkable appearance of globes and orbits. Sinuses: Mastoid air cells are unremarkable. There is mucosal thickening of the left maxillary sinus and near complete opacification of the right maxillary sinus. Ethmoid air cell mucosal thickening is noted. Soft tissues: Small well loculated cyst seen overlying the left mandible. Limited intracranial: No acute findings. Cervical spine: Alignment: There is straightening of the normal cervical lordosis. Skull base and vertebrae: Visualized skull base is intact. No atlanto-occipital dissociation. The vertebral body heights are well maintained. No fracture or pathologic osseous lesion seen. The patient is status post ACDF at C3-C4. Solid interbody fusion is seen. No periprosthetic lucency or fracture is identified. Soft tissues and spinal canal: The visualized paraspinal soft tissues are unremarkable. No prevertebral soft tissue swelling is seen. The spinal canal is grossly unremarkable, no large epidural collection or significant canal

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
## 2019-01-23 NOTE — Progress Notes (Signed)
LTM maint complete - no skin breakdown under: fp1,f7,a1
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
## 2019-01-23 NOTE — Progress Notes (Signed)
LTM EEG discontinued - possible skin break down at f7, f3, f4, and f8 unsure as pt already had sores on his face.
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
## 2019-01-24 DIAGNOSIS — R001 Bradycardia, unspecified: Secondary | ICD-10-CM

## 2019-01-24 LAB — CBC
HCT: 32.4 % — ABNORMAL LOW (ref 39.0–52.0)
Hemoglobin: 9.9 g/dL — ABNORMAL LOW (ref 13.0–17.0)
MCH: 25.8 pg — ABNORMAL LOW (ref 26.0–34.0)
MCHC: 30.6 g/dL (ref 30.0–36.0)
MCV: 84.4 fL (ref 80.0–100.0)
Platelets: 73 10*3/uL — ABNORMAL LOW (ref 150–400)
RBC: 3.84 MIL/uL — ABNORMAL LOW (ref 4.22–5.81)
RDW: 15.9 % — ABNORMAL HIGH (ref 11.5–15.5)
WBC: 9.4 10*3/uL (ref 4.0–10.5)
nRBC: 2.6 % — ABNORMAL HIGH (ref 0.0–0.2)

## 2019-01-24 LAB — RETICULOCYTES
Immature Retic Fract: 3.3 % (ref 2.3–15.9)
RBC.: 3.81 MIL/uL — ABNORMAL LOW (ref 4.22–5.81)
Retic Count, Absolute: 23.6 10*3/uL (ref 19.0–186.0)
Retic Ct Pct: 0.6 % (ref 0.4–3.1)

## 2019-01-24 LAB — CULTURE, BLOOD (ROUTINE X 2): Special Requests: ADEQUATE

## 2019-01-24 LAB — GLUCOSE, CAPILLARY
Glucose-Capillary: 224 mg/dL — ABNORMAL HIGH (ref 70–99)
Glucose-Capillary: 234 mg/dL — ABNORMAL HIGH (ref 70–99)
Glucose-Capillary: 201 mg/dL — ABNORMAL HIGH (ref 70–99)
Glucose-Capillary: 204 mg/dL — ABNORMAL HIGH (ref 70–99)
Glucose-Capillary: 141 mg/dL — ABNORMAL HIGH (ref 70–99)

## 2019-01-24 LAB — DIC (DISSEMINATED INTRAVASCULAR COAGULATION)PANEL
D-Dimer, Quant: 1.28 ug/mL-FEU — ABNORMAL HIGH (ref 0.00–0.50)
Fibrinogen: 516 mg/dL — ABNORMAL HIGH (ref 210–475)
INR: 1.2 (ref 0.8–1.2)
Platelets: 64 10*3/uL — ABNORMAL LOW (ref 150–400)
Prothrombin Time: 15 seconds (ref 11.4–15.2)
Smear Review: NONE SEEN
aPTT: 39 seconds — ABNORMAL HIGH (ref 24–36)

## 2019-01-24 LAB — BASIC METABOLIC PANEL
Anion gap: 9 (ref 5–15)
BUN: 36 mg/dL — ABNORMAL HIGH (ref 8–23)
CO2: 25 mmol/L (ref 22–32)
Calcium: 7.8 mg/dL — ABNORMAL LOW (ref 8.9–10.3)
Chloride: 110 mmol/L (ref 98–111)
Creatinine, Ser: 1.36 mg/dL — ABNORMAL HIGH (ref 0.61–1.24)
GFR calc Af Amer: >60 mL/min (ref >60)
GFR calc non Af Amer: 55 mL/min — ABNORMAL LOW (ref >60)
Glucose, Bld: 249 mg/dL — ABNORMAL HIGH (ref 70–99)
Potassium: 3.9 mmol/L (ref 3.5–5.1)
Sodium: 144 mmol/L (ref 135–145)

## 2019-01-24 LAB — TRIGLYCERIDES: Triglycerides: 67 mg/dL (ref <150)

## 2019-01-24 LAB — IMMATURE PLATELET FRACTION: Immature Platelet Fraction: 8.7 % — ABNORMAL HIGH (ref 1.2–8.6)

## 2019-01-24 MED ORDER — INSULIN ASPART 100 UNIT/ML ~~LOC~~ SOLN
0.0000 [IU] | SUBCUTANEOUS | Status: DC
  Administered 2019-01-24: 2 [IU] via SUBCUTANEOUS
  Administered 2019-01-24 (×2): 5 [IU] via SUBCUTANEOUS
  Administered 2019-01-25: 2 [IU] via SUBCUTANEOUS
  Administered 2019-01-25: 3 [IU] via SUBCUTANEOUS
  Administered 2019-01-25: 2 [IU] via SUBCUTANEOUS
  Administered 2019-01-25: 3 [IU] via SUBCUTANEOUS
  Administered 2019-01-26 – 2019-02-03 (×21): 2 [IU] via SUBCUTANEOUS
  Administered 2019-02-03: 05:00:00 3 [IU] via SUBCUTANEOUS
  Administered 2019-02-03 – 2019-02-05 (×8): 2 [IU] via SUBCUTANEOUS
  Administered 2019-02-05 – 2019-02-06 (×2): 3 [IU] via SUBCUTANEOUS
  Administered 2019-02-06 – 2019-02-07 (×5): 2 [IU] via SUBCUTANEOUS
  Administered 2019-02-07 – 2019-02-08 (×2): 3 [IU] via SUBCUTANEOUS
  Administered 2019-02-08 (×2): 2 [IU] via SUBCUTANEOUS
  Administered 2019-02-09 (×4): 3 [IU] via SUBCUTANEOUS
  Administered 2019-02-09 – 2019-02-10 (×2): 5 [IU] via SUBCUTANEOUS
  Administered 2019-02-10 (×2): 3 [IU] via SUBCUTANEOUS
  Administered 2019-02-10: 2 [IU] via SUBCUTANEOUS
  Administered 2019-02-10: 3 [IU] via SUBCUTANEOUS
  Administered 2019-02-11: 2 [IU] via SUBCUTANEOUS
  Administered 2019-02-11: 3 [IU] via SUBCUTANEOUS
  Administered 2019-02-11: 2 [IU] via SUBCUTANEOUS
  Administered 2019-02-11 (×2): 3 [IU] via SUBCUTANEOUS
  Administered 2019-02-11: 2 [IU] via SUBCUTANEOUS
  Administered 2019-02-12: 3 [IU] via SUBCUTANEOUS
  Administered 2019-02-12: 2 [IU] via SUBCUTANEOUS
  Administered 2019-02-12: 3 [IU] via SUBCUTANEOUS
  Administered 2019-02-13 – 2019-02-16 (×5): 2 [IU] via SUBCUTANEOUS
  Administered 2019-02-16: 09:00:00 3 [IU] via SUBCUTANEOUS
  Administered 2019-02-17 – 2019-02-18 (×3): 2 [IU] via SUBCUTANEOUS
  Administered 2019-02-19: 3 [IU] via SUBCUTANEOUS
  Administered 2019-02-19 – 2019-02-20 (×4): 2 [IU] via SUBCUTANEOUS

## 2019-01-24 MED ORDER — LACTATED RINGERS IV BOLUS
500.0000 mL | Freq: Once | INTRAVENOUS | Status: AC
  Administered 2019-01-24: 500 mL via INTRAVENOUS

## 2019-01-24 MED ORDER — TRIAMCINOLONE 0.1 % CREAM:EUCERIN CREAM 1:1
TOPICAL_CREAM | Freq: Two times a day (BID) | CUTANEOUS | Status: DC
  Filled 2019-01-24 (×2): qty 1
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
## 2019-01-24 NOTE — Progress Notes (Addendum)
Patient ID: Marc [name], male   DOB: 06-21-1955, 64 y.o.   MRN: [id number]         [facility]  Date of Admission:  01/21/2019           Day 2 vancomycin        Day 2 cefazolin ASSESSMENT: He has MSSA bacteremia.  I will continue current cefazolin alone.  Repeat blood cultures are negative at 24 hours.  There is no evidence of endocarditis by exam or TTE.  I would hold off on making a decision about TEE pending further observation and discussions of goals of care.  PLAN: 1. Continue cefazolin alone 2. Please call Dr. Dietrich [place] [phone]) for any infectious disease questions this weekend  Principal Problem:   Staphylococcus aureus bacteremia Active Problems:   Encephalopathy   Bipolar 1 disorder ([place])   Cigarette smoker   Dermatitis   Acute respiratory failure ([place])   Scheduled Meds: . chlorhexidine gluconate (MEDLINE KIT)  15 mL Mouth Rinse BID  . Chlorhexidine Gluconate Cloth  6 each Topical Daily  . Chlorhexidine Gluconate Cloth  6 each Topical Daily  . feeding supplement (PRO-STAT SUGAR FREE 64)  60 mL Per Tube BID  . folic acid  1 mg Oral Daily  . free water  400 mL Per Tube Q8H  . hydrocortisone sod succinate (SOLU-CORTEF) inj  50 mg Intravenous Q6H  . insulin aspart  2-6 Units Subcutaneous Q4H  . mouth rinse  15 mL Mouth Rinse 10 times per day  . pantoprazole sodium  40 mg Per Tube Daily  . sodium chloride flush  10-40 mL Intracatheter Q12H   Continuous Infusions: . sodium chloride 10 mL/hr at 01/24/19 0200  .  ceFAZolin (ANCEF) IV 2 g (01/24/19 0704)  . dextrose 75 mL/hr at 01/24/19 0200  . feeding supplement (VITAL AF 1.2 CAL) 1,000 mL (01/23/19 1828)  . levETIRAcetam Stopped (01/23/19 2038)  . norepinephrine (LEVOPHED) Adult infusion 7 mcg/min (01/23/19 1702)  . propofol (DIPRIVAN) infusion 5 mcg/kg/min (01/24/19 0200)  . thiamine injection 500 mg (01/24/19 0304)  . vancomycin Stopped (01/23/19 2342)  . vasopressin  (PITRESSIN) infusion - *FOR SHOCK*     PRN Meds:.sodium chloride, fentaNYL (SUBLIMAZE) injection, fentaNYL (SUBLIMAZE) injection, ipratropium-albuterol, sodium chloride flush  Review of Systems: Review of Systems  Unable to perform ROS: Intubated    Not on File  OBJECTIVE: Vitals:   01/24/19 0100 01/24/19 0200 01/24/19 0320 01/24/19 0500  BP: 100/62 107/67    Pulse: 76 64    Resp: 16 16    Temp: 99.7 F (37.6 C) 99.1 F (37.3 C)    TempSrc:      SpO2: 98% 100% 100%   Weight:    99.7 kg  Height:       Body mass index is 28.22 kg/m.  Physical Exam Constitutional:      Comments: He remains on the ventilator.  He does not respond to voice.  Cardiovascular:     Rate and Rhythm: Regular rhythm. Bradycardia present.     Heart sounds: No murmur.     Comments: Distant heart sounds. Pulmonary:     Breath sounds: Normal breath sounds.  Abdominal:     Palpations: Abdomen is soft.  Skin:    Comments: Diffuse, dry and thickened skin.     Lab Results Lab Results  Component Value Date   WBC 9.4 01/24/2019   HGB 9.9 (L) 01/24/2019   HCT 32.4 (L) 01/24/2019  MCV 84.4 01/24/2019   PLT 73 (L) 01/24/2019    Lab Results  Component Value Date   CREATININE 1.36 (H) 01/24/2019   BUN 36 (H) 01/24/2019   NA 144 01/24/2019   K 3.9 01/24/2019   CL 110 01/24/2019   CO2 25 01/24/2019    Lab Results  Component Value Date   ALT 46 (H) 01/22/2019   AST 57 (H) 01/22/2019   ALKPHOS 83 01/22/2019   BILITOT 1.2 01/22/2019     Microbiology: Recent Results (from the past 240 hour(s))  Respiratory Panel by RT PCR (Flu A&B, Covid) - Nasopharyngeal Swab     Status: None   Collection Time: 01/21/19  8:50 PM   Specimen: Nasopharyngeal Swab  Result Value Ref Range Status   SARS Coronavirus 2 by RT PCR NEGATIVE NEGATIVE Final    Comment: (NOTE) SARS-CoV-2 target nucleic acids are NOT DETECTED. The SARS-CoV-2 RNA is generally detectable in upper respiratoy specimens during the acute  phase of infection. The lowest concentration of SARS-CoV-2 viral copies this assay can detect is 131 copies/mL. A negative result does not preclude SARS-Cov-2 infection and should not be used as the sole basis for treatment or other patient management decisions. A negative result may occur with  improper specimen collection/handling, submission of specimen other than nasopharyngeal swab, presence of viral mutation(s) within the areas targeted by this assay, and inadequate number of viral copies (<131 copies/mL). A negative result must be combined with clinical observations, patient history, and epidemiological information. The expected result is Negative. Fact Sheet for Patients:  PinkCheek.be Fact Sheet for Healthcare Providers:  GravelBags.it This test is not yet ap proved or cleared by the [place] FDA and  has been authorized for detection and/or diagnosis of SARS-CoV-2 by FDA under an Emergency Use Authorization (EUA). This EUA will remain  in effect (meaning this test can be used) for the duration of the COVID-19 declaration under Section 564(b)(1) of the Act, 21 U.S.C. section 360bbb-3(b)(1), unless the authorization is terminated or revoked sooner.    Influenza A by PCR NEGATIVE NEGATIVE Final   Influenza B by PCR NEGATIVE NEGATIVE Final    Comment: (NOTE) The Xpert Xpress SARS-CoV-2/FLU/RSV assay is intended as an aid in  the diagnosis of influenza from Nasopharyngeal swab specimens and  should not be used as a sole basis for treatment. Nasal washings and  aspirates are unacceptable for Xpert Xpress SARS-CoV-2/FLU/RSV  testing. Fact Sheet for Patients: PinkCheek.be Fact Sheet for Healthcare Providers: GravelBags.it This test is not yet approved or cleared by the [place] FDA and  has been authorized for detection and/or diagnosis of SARS-CoV-2 by    FDA under an Emergency Use Authorization (EUA). This EUA will remain  in effect (meaning this test can be used) for the duration of the  Covid-19 declaration under Section 564(b)(1) of the Act, 21  U.S.C. section 360bbb-3(b)(1), unless the authorization is  terminated or revoked. Performed at [facility], [place] [address], [place], [place] [address]   Urine culture     Status: Abnormal   Collection Time: 01/21/19  9:30 PM   Specimen: Urine, Catheterized  Result Value Ref Range Status   Specimen Description URINE, CATHETERIZED  Final   Special Requests   Final    NONE Performed at [facility], [place] [address] [place], [place] [address]    Culture MULTIPLE SPECIES PRESENT, SUGGEST RECOLLECTION (A)  Final   Report Status 01/22/2019 FINAL  Final  Blood culture (routine  x 2)     Status: Abnormal (Preliminary result)   Collection Time: 01/21/19 10:00 PM   Specimen: BLOOD  Result Value Ref Range Status   Specimen Description BLOOD RIGHT ANTECUBITAL  Final   Special Requests   Final    BOTTLES DRAWN AEROBIC AND ANAEROBIC Blood Culture adequate volume   Culture  Setup Time   Final    GRAM POSITIVE COCCI IN CLUSTERS IN BOTH AEROBIC AND ANAEROBIC BOTTLES CRITICAL RESULT CALLED TO, READ BACK BY AND VERIFIED WITH: Clydene Fake FRENS [id number] [place] Performed at [facility], [place] [address] [place], [place] [address]    Culture STAPHYLOCOCCUS AUREUS (A)  Final   Report Status PENDING  Incomplete  Respiratory Panel by RT PCR (Flu A&B, Covid) - Nasopharyngeal Swab     Status: None   Collection Time: 01/22/19  8:42 AM   Specimen: Nasopharyngeal Swab  Result Value Ref Range Status   SARS Coronavirus 2 by RT PCR NEGATIVE NEGATIVE Final    Comment: (NOTE) SARS-CoV-2 target nucleic acids are NOT DETECTED. The SARS-CoV-2 RNA is generally detectable in upper respiratoy specimens during the acute phase of infection. The lowest concentration of SARS-CoV-2 viral copies this  assay can detect is 131 copies/mL. A negative result does not preclude SARS-Cov-2 infection and should not be used as the sole basis for treatment or other patient management decisions. A negative result may occur with  improper specimen collection/handling, submission of specimen other than nasopharyngeal swab, presence of viral mutation(s) within the areas targeted by this assay, and inadequate number of viral copies (<131 copies/mL). A negative result must be combined with clinical observations, patient history, and epidemiological information. The expected result is Negative. Fact Sheet for Patients:  PinkCheek.be Fact Sheet for Healthcare Providers:  GravelBags.it This test is not yet ap proved or cleared by the [place] FDA and  has been authorized for detection and/or diagnosis of SARS-CoV-2 by FDA under an Emergency Use Authorization (EUA). This EUA will remain  in effect (meaning this test can be used) for the duration of the COVID-19 declaration under Section 564(b)(1) of the Act, 21 U.S.C. section 360bbb-3(b)(1), unless the authorization is terminated or revoked sooner.    Influenza A by PCR NEGATIVE NEGATIVE Final   Influenza B by PCR NEGATIVE NEGATIVE Final    Comment: (NOTE) The Xpert Xpress SARS-CoV-2/FLU/RSV assay is intended as an aid in  the diagnosis of influenza from Nasopharyngeal swab specimens and  should not be used as a sole basis for treatment. Nasal washings and  aspirates are unacceptable for Xpert Xpress SARS-CoV-2/FLU/RSV  testing. Fact Sheet for Patients: PinkCheek.be Fact Sheet for Healthcare Providers: GravelBags.it This test is not yet approved or cleared by the [place] FDA and  has been authorized for detection and/or diagnosis of SARS-CoV-2 by  FDA under an Emergency Use Authorization (EUA). This EUA will remain  in  effect (meaning this test can be used) for the duration of the  Covid-19 declaration under Section 564(b)(1) of the Act, 21  U.S.C. section 360bbb-3(b)(1), unless the authorization is  terminated or revoked. Performed at [facility], [place] [address], [place],  [address]   MRSA PCR Screening     Status: None   Collection Time: 01/22/19  9:32 AM   Specimen: Nasopharyngeal  Result Value Ref Range Status   MRSA by PCR NEGATIVE NEGATIVE Final    Comment:        The GeneXpert MRSA Assay (FDA approved for NASAL  specimens only), is one component of a comprehensive MRSA colonization surveillance program. It is not intended to diagnose MRSA infection nor to guide or monitor treatment for MRSA infections. Performed at [facility], [place] [address], [place], [place] [address]   Blood culture (routine x 2)     Status: None (Preliminary result)   Collection Time: 01/22/19 10:21 AM   Specimen: BLOOD RIGHT HAND  Result Value Ref Range Status   Specimen Description BLOOD RIGHT HAND  Final   Special Requests   Final    BOTTLES DRAWN AEROBIC ONLY Blood Culture results may not be optimal due to an inadequate volume of blood received in culture bottles   Culture   Final    NO GROWTH 1 DAY Performed at [facility], [place] [address] [place], [place] [address]    Report Status PENDING  Incomplete    Michel Bickers, MD [facility] [place] [facility] [phone] pager   [phone] cell 01/24/2019, 7:51 AM

## 2019-01-25 LAB — BASIC METABOLIC PANEL
Anion gap: 8 (ref 5–15)
BUN: 41 mg/dL — ABNORMAL HIGH (ref 8–23)
CO2: 27 mmol/L (ref 22–32)
Calcium: 7.8 mg/dL — ABNORMAL LOW (ref 8.9–10.3)
Chloride: 107 mmol/L (ref 98–111)
Creatinine, Ser: 1.12 mg/dL (ref 0.61–1.24)
GFR calc Af Amer: >60 mL/min (ref >60)
GFR calc non Af Amer: >60 mL/min (ref >60)
Glucose, Bld: 151 mg/dL — ABNORMAL HIGH (ref 70–99)
Potassium: 3.5 mmol/L (ref 3.5–5.1)
Sodium: 142 mmol/L (ref 135–145)

## 2019-01-25 LAB — CBC
HCT: 32.4 % — ABNORMAL LOW (ref 39.0–52.0)
Hemoglobin: 9.9 g/dL — ABNORMAL LOW (ref 13.0–17.0)
MCH: 25.4 pg — ABNORMAL LOW (ref 26.0–34.0)
MCHC: 30.6 g/dL (ref 30.0–36.0)
MCV: 83.1 fL (ref 80.0–100.0)
Platelets: 53 10*3/uL — ABNORMAL LOW (ref 150–400)
RBC: 3.9 MIL/uL — ABNORMAL LOW (ref 4.22–5.81)
RDW: 15.9 % — ABNORMAL HIGH (ref 11.5–15.5)
WBC: 6.8 10*3/uL (ref 4.0–10.5)
nRBC: 1.6 % — ABNORMAL HIGH (ref 0.0–0.2)

## 2019-01-25 LAB — GLUCOSE, CAPILLARY
Glucose-Capillary: 113 mg/dL — ABNORMAL HIGH (ref 70–99)
Glucose-Capillary: 120 mg/dL — ABNORMAL HIGH (ref 70–99)
Glucose-Capillary: 129 mg/dL — ABNORMAL HIGH (ref 70–99)
Glucose-Capillary: 132 mg/dL — ABNORMAL HIGH (ref 70–99)
Glucose-Capillary: 141 mg/dL — ABNORMAL HIGH (ref 70–99)
Glucose-Capillary: 170 mg/dL — ABNORMAL HIGH (ref 70–99)

## 2019-01-25 MED ORDER — SODIUM CHLORIDE 0.9 % IV SOLN
2.0000 g | INTRAVENOUS | Status: DC
  Administered 2019-01-25 – 2019-01-27 (×12): 2 g via INTRAVENOUS
  Filled 2019-01-25 (×17): qty 2000

## 2019-01-25 MED ORDER — NAFCILLIN SODIUM 2 G IJ SOLR
2.0000 g | INTRAMUSCULAR | Status: DC
  Filled 2019-01-25 (×2): qty 2000

## 2019-01-25 MED ORDER — TRIAMCINOLONE 0.1 % CREAM:EUCERIN CREAM 1:1
TOPICAL_CREAM | Freq: Three times a day (TID) | CUTANEOUS | Status: DC
  Administered 2019-01-29 – 2019-01-30 (×4): 1 via TOPICAL
  Filled 2019-01-25 (×3): qty 1

## 2019-01-25 MED ORDER — SODIUM CHLORIDE 0.9% FLUSH
10.0000 mL | Freq: Two times a day (BID) | INTRAVENOUS | Status: DC
  Administered 2019-01-25 – 2019-02-14 (×32): 10 mL

## 2019-01-25 MED ORDER — SODIUM CHLORIDE 0.9% FLUSH: 10.0000 mL | INTRAVENOUS | Status: DC | PRN
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
## 2019-01-25 NOTE — Progress Notes (Signed)
Attempted to wean patient again on PSV 10/5 40% again patient became apneic

## 2019-01-25 NOTE — Progress Notes (Signed)
      INFECTIOUS DISEASE ATTENDING ADDENDUM:   Date: 01/25/2019  Patient name: Marc [name]  Medical record number: [id number]  Date of birth: 1955-02-11   I am going ahead and switching to Nafcillin for CNS penetration in case patient has seeded meninges or has left sided endocarditis with septic emboli to CNS.   Paulette Blanch Dam 01/25/2019, 3:07 PM

## 2019-01-26 ENCOUNTER — Inpatient Hospital Stay (HOSPITAL_COMMUNITY): Payer: Medicare Other

## 2019-01-26 LAB — DIC (DISSEMINATED INTRAVASCULAR COAGULATION)PANEL
D-Dimer, Quant: 3.72 ug/mL-FEU — ABNORMAL HIGH (ref 0.00–0.50)
Fibrinogen: 551 mg/dL — ABNORMAL HIGH (ref 210–475)
INR: 1.2 (ref 0.8–1.2)
Platelets: 49 10*3/uL — ABNORMAL LOW (ref 150–400)
Prothrombin Time: 14.8 seconds (ref 11.4–15.2)
Smear Review: NONE SEEN
aPTT: 36 seconds (ref 24–36)

## 2019-01-26 LAB — CBC
HCT: 31.8 % — ABNORMAL LOW (ref 39.0–52.0)
Hemoglobin: 10.1 g/dL — ABNORMAL LOW (ref 13.0–17.0)
MCH: 26.4 pg (ref 26.0–34.0)
MCHC: 31.8 g/dL (ref 30.0–36.0)
MCV: 83 fL (ref 80.0–100.0)
Platelets: 48 10*3/uL — ABNORMAL LOW (ref 150–400)
RBC: 3.83 MIL/uL — ABNORMAL LOW (ref 4.22–5.81)
RDW: 15.9 % — ABNORMAL HIGH (ref 11.5–15.5)
WBC: 5.6 10*3/uL (ref 4.0–10.5)
nRBC: 0.9 % — ABNORMAL HIGH (ref 0.0–0.2)

## 2019-01-26 LAB — BASIC METABOLIC PANEL
Anion gap: 9 (ref 5–15)
BUN: 36 mg/dL — ABNORMAL HIGH (ref 8–23)
CO2: 26 mmol/L (ref 22–32)
Calcium: 7.7 mg/dL — ABNORMAL LOW (ref 8.9–10.3)
Chloride: 110 mmol/L (ref 98–111)
Creatinine, Ser: 0.83 mg/dL (ref 0.61–1.24)
GFR calc Af Amer: >60 mL/min (ref >60)
GFR calc non Af Amer: >60 mL/min (ref >60)
Glucose, Bld: 134 mg/dL — ABNORMAL HIGH (ref 70–99)
Potassium: 3.8 mmol/L (ref 3.5–5.1)
Sodium: 145 mmol/L (ref 135–145)

## 2019-01-26 LAB — GLUCOSE, CAPILLARY
Glucose-Capillary: 110 mg/dL — ABNORMAL HIGH (ref 70–99)
Glucose-Capillary: 120 mg/dL — ABNORMAL HIGH (ref 70–99)
Glucose-Capillary: 122 mg/dL — ABNORMAL HIGH (ref 70–99)
Glucose-Capillary: 125 mg/dL — ABNORMAL HIGH (ref 70–99)
Glucose-Capillary: 128 mg/dL — ABNORMAL HIGH (ref 70–99)
Glucose-Capillary: 128 mg/dL — ABNORMAL HIGH (ref 70–99)
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
## 2019-01-26 NOTE — Progress Notes (Signed)
Called Mr. Cayton Cuevas the patient's son and updated him about his father's medical care. The patient's son told me that the patient had MRSA infection in 2008 and it involved his back at that time. All the patient's questions were answered.   Lorenso Courier, MD Internal Medicine PGY3 01/26/2019, 3:46 PM

## 2019-01-26 NOTE — Progress Notes (Signed)
Attempted wean patient became apneic   

## 2019-01-27 ENCOUNTER — Inpatient Hospital Stay (HOSPITAL_COMMUNITY): Payer: Medicare Other

## 2019-01-27 DIAGNOSIS — R4182 Altered mental status, unspecified: Secondary | ICD-10-CM

## 2019-01-27 DIAGNOSIS — F319 Bipolar disorder, unspecified: Secondary | ICD-10-CM

## 2019-01-27 DIAGNOSIS — D696 Thrombocytopenia, unspecified: Secondary | ICD-10-CM

## 2019-01-27 DIAGNOSIS — J9690 Respiratory failure, unspecified, unspecified whether with hypoxia or hypercapnia: Secondary | ICD-10-CM

## 2019-01-27 LAB — CBC
HCT: 27 % — ABNORMAL LOW (ref 39.0–52.0)
Hemoglobin: 8.4 g/dL — ABNORMAL LOW (ref 13.0–17.0)
MCH: 26.4 pg (ref 26.0–34.0)
MCHC: 31.1 g/dL (ref 30.0–36.0)
MCV: 84.9 fL (ref 80.0–100.0)
Platelets: 41 10*3/uL — ABNORMAL LOW (ref 150–400)
RBC: 3.18 MIL/uL — ABNORMAL LOW (ref 4.22–5.81)
RDW: 16 % — ABNORMAL HIGH (ref 11.5–15.5)
WBC: 5.8 10*3/uL (ref 4.0–10.5)
nRBC: 0.3 % — ABNORMAL HIGH (ref 0.0–0.2)

## 2019-01-27 LAB — BASIC METABOLIC PANEL
Anion gap: 5 (ref 5–15)
BUN: 30 mg/dL — ABNORMAL HIGH (ref 8–23)
CO2: 28 mmol/L (ref 22–32)
Calcium: 7.6 mg/dL — ABNORMAL LOW (ref 8.9–10.3)
Chloride: 114 mmol/L — ABNORMAL HIGH (ref 98–111)
Creatinine, Ser: 0.86 mg/dL (ref 0.61–1.24)
GFR calc Af Amer: >60 mL/min (ref >60)
GFR calc non Af Amer: >60 mL/min (ref >60)
Glucose, Bld: 134 mg/dL — ABNORMAL HIGH (ref 70–99)
Potassium: 3.8 mmol/L (ref 3.5–5.1)
Sodium: 147 mmol/L — ABNORMAL HIGH (ref 135–145)

## 2019-01-27 LAB — GLUCOSE, CAPILLARY
Glucose-Capillary: 109 mg/dL — ABNORMAL HIGH (ref 70–99)
Glucose-Capillary: 112 mg/dL — ABNORMAL HIGH (ref 70–99)
Glucose-Capillary: 116 mg/dL — ABNORMAL HIGH (ref 70–99)
Glucose-Capillary: 121 mg/dL — ABNORMAL HIGH (ref 70–99)
Glucose-Capillary: 128 mg/dL — ABNORMAL HIGH (ref 70–99)
Glucose-Capillary: 135 mg/dL — ABNORMAL HIGH (ref 70–99)

## 2019-01-27 LAB — CULTURE, BLOOD (ROUTINE X 2): Culture: NO GROWTH

## 2019-01-27 LAB — TRIGLYCERIDES: Triglycerides: 57 mg/dL (ref <150)

## 2019-01-27 MED ORDER — FENTANYL 2500MCG IN NS 250ML (10MCG/ML) PREMIX INFUSION
25.0000 ug/h | INTRAVENOUS | Status: DC
  Administered 2019-01-27: 25 ug/h via INTRAVENOUS
  Filled 2019-01-27: qty 250

## 2019-01-27 MED ORDER — DEXTROSE 5 % IV SOLN: INTRAVENOUS | Status: AC

## 2019-01-27 MED ORDER — PHENYLEPHRINE HCL-NACL 10-0.9 MG/250ML-% IV SOLN
INTRAVENOUS | Status: AC
  Filled 2019-01-27: qty 250

## 2019-01-27 MED ORDER — FENTANYL CITRATE (PF) 100 MCG/2ML IJ SOLN
25.0000 ug | INTRAMUSCULAR | Status: DC | PRN
  Administered 2019-01-27: 25 ug via INTRAVENOUS
  Filled 2019-01-27: qty 2

## 2019-01-27 MED ORDER — FENTANYL CITRATE (PF) 100 MCG/2ML IJ SOLN: 25.0000 ug | Freq: Once | INTRAMUSCULAR | Status: DC

## 2019-01-27 MED ORDER — FREE WATER
400.0000 mL | Freq: Four times a day (QID) | Status: DC
  Administered 2019-01-27 (×2): 400 mL

## 2019-01-27 MED ORDER — FENTANYL CITRATE (PF) 100 MCG/2ML IJ SOLN: 25.0000 ug | INTRAMUSCULAR | Status: DC | PRN

## 2019-01-27 MED ORDER — ETOMIDATE 2 MG/ML IV SOLN: 10.0000 mg | Freq: Once | INTRAVENOUS | Status: DC

## 2019-01-27 MED ORDER — FENTANYL BOLUS VIA INFUSION
25.0000 ug | INTRAVENOUS | Status: DC | PRN
  Administered 2019-01-28 (×4): 25 ug via INTRAVENOUS
  Filled 2019-01-27: qty 25

## 2019-01-27 MED ORDER — DEXMEDETOMIDINE HCL IN NACL 400 MCG/100ML IV SOLN
0.0000 ug/kg/h | INTRAVENOUS | Status: DC
  Administered 2019-01-28: 0.4 ug/kg/h via INTRAVENOUS
  Administered 2019-01-28: 0.5 ug/kg/h via INTRAVENOUS
  Administered 2019-01-29: 0.4 ug/kg/h via INTRAVENOUS
  Filled 2019-01-27 (×3): qty 100

## 2019-01-27 MED ORDER — ROCURONIUM BROMIDE 50 MG/5ML IV SOLN
50.0000 mg | Freq: Once | INTRAVENOUS | Status: AC
  Administered 2019-01-27: 50 mg via INTRAVENOUS
  Filled 2019-01-27: qty 5

## 2019-01-27 MED ORDER — ETOMIDATE 2 MG/ML IV SOLN
20.0000 mg | Freq: Once | INTRAVENOUS | Status: AC
  Administered 2019-01-27: 20 mg via INTRAVENOUS

## 2019-01-27 MED ORDER — SODIUM CHLORIDE 0.9 % IV SOLN
12.0000 g | INTRAVENOUS | Status: DC
  Administered 2019-01-28: 16:00:00 12 g via INTRAVENOUS
  Filled 2019-01-27 (×3): qty 12000
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
## 2019-01-27 NOTE — Procedures (Signed)
Intubation Procedure Note DECARLOS EMPEY [id number] Mar 16, 1955  Procedure: Intubation Indications: Airway protection and maintenance  Procedure Details Consent: Unable to obtain consent because of emergent medical necessity. Time Out: Verified patient identification, verified procedure, site/side was marked, verified correct patient position, special equipment/implants available, medications/allergies/relevent history reviewed, required imaging and test results available.  Performed   Etomidate 51m + 10 mg  iv Rocuronium 1016miv  Maximum sterile technique was used including cap, gloves, hand hygiene and mask.  MAC and 4 glidescope First attempt    Evaluation Hemodynamic Status: BP stable throughout; O2 sats: stable throughout Patient's Current Condition: stable Complications: No apparent complications Patient did tolerate procedure well. Chest X-ray ordered to verify placement.  CXR: pending.   OGT placed with assistance of glidescope. Confirmed placement with stethoscope.    LaJulian Hy/18/2021

## 2019-01-27 NOTE — Procedures (Signed)
Extubation Procedure Note  Patient Details:   Name: Marc [name] DOB: 02-15-1955 MRN: [id number]   Airway Documentation:  + cuff leak test prior to extubation.    Vent end date: 01/27/19 Vent end time: 1730   Evaluation  O2 sats: stable throughout Complications: No apparent complications Patient did tolerate procedure well. Bilateral Breath Sounds: Rhonchi, Diminished   Yes pt attempting to speak, nods head "yes" & "no" when asked questions.   No stridor noted, no distress noted.   Jennette Kettle 01/27/2019, 5:35 PM

## 2019-01-27 NOTE — Progress Notes (Signed)
[facility]   Reason for visit: Follow up on MSSA bacteremia  Interval History: remains intubated, received some fentanyl.  Nursing reports he does follow some commands but not ready for extubation.  Increased secretions.  Afebrile and WBC wnl.     Physical Exam: Constitutional:  Vitals:   01/27/19 0840 01/27/19 0900  BP:  102/69  Pulse:  79  Resp:  16  Temp:  97.9 F (36.6 C)  SpO2: 99% 99%   patient appears in NAD Eyes: anicteric HENT: +ET Respiratory: respiratory effort on vent Cardiovascular: RRR Skin: dry, eczematous skin  Review of Systems: Unable to be assessed due to patient factors  Lab Results  Component Value Date   WBC 5.8 01/27/2019   HGB 8.4 (L) 01/27/2019   HCT 27.0 (L) 01/27/2019   MCV 84.9 01/27/2019   PLT 41 (L) 01/27/2019    Lab Results  Component Value Date   CREATININE 0.86 01/27/2019   BUN 30 (H) 01/27/2019   NA 147 (H) 01/27/2019   K 3.8 01/27/2019   CL 114 (H) 01/27/2019   CO2 28 01/27/2019    Lab Results  Component Value Date   ALT 46 (H) 01/22/2019   AST 57 (H) 01/22/2019   ALKPHOS 83 01/22/2019     Microbiology: Recent Results (from the past 240 hour(s))  Respiratory Panel by RT PCR (Flu A&B, Covid) - Nasopharyngeal Swab     Status: None   Collection Time: 01/21/19  8:50 PM   Specimen: Nasopharyngeal Swab  Result Value Ref Range Status   SARS Coronavirus 2 by RT PCR NEGATIVE NEGATIVE Final    Comment: (NOTE) SARS-CoV-2 target nucleic acids are NOT DETECTED. The SARS-CoV-2 RNA is generally detectable in upper respiratoy specimens during the acute phase of infection. The lowest concentration of SARS-CoV-2 viral copies this assay can detect is 131 copies/mL. A negative result does not preclude SARS-Cov-2 infection and should not be used as the sole basis for treatment or other patient management decisions. A negative result may occur with  improper specimen collection/handling, submission of  specimen other than nasopharyngeal swab, presence of viral mutation(s) within the areas targeted by this assay, and inadequate number of viral copies (<131 copies/mL). A negative result must be combined with clinical observations, patient history, and epidemiological information. The expected result is Negative. Fact Sheet for Patients:  https://www.moore.com/ Fact Sheet for Healthcare Providers:  https://www.young.biz/ This test is not yet ap proved or cleared by the [place] FDA and  has been authorized for detection and/or diagnosis of SARS-CoV-2 by FDA under an Emergency Use Authorization (EUA). This EUA will remain  in effect (meaning this test can be used) for the duration of the COVID-19 declaration under Section 564(b)(1) of the Act, 21 U.S.C. section 360bbb-3(b)(1), unless the authorization is terminated or revoked sooner.    Influenza A by PCR NEGATIVE NEGATIVE Final   Influenza B by PCR NEGATIVE NEGATIVE Final    Comment: (NOTE) The Xpert Xpress SARS-CoV-2/FLU/RSV assay is intended as an aid in  the diagnosis of influenza from Nasopharyngeal swab specimens and  should not be used as a sole basis for treatment. Nasal washings and  aspirates are unacceptable for Xpert Xpress SARS-CoV-2/FLU/RSV  testing. Fact Sheet for Patients: https://www.moore.com/ Fact Sheet for Healthcare Providers: https://www.young.biz/ This test is not yet approved or cleared by the [place] FDA and  has been authorized for detection and/or diagnosis of SARS-CoV-2 by  FDA under an Emergency Use Authorization (EUA). This  EUA will remain  in effect (meaning this test can be used) for the duration of the  Covid-19 declaration under Section 564(b)(1) of the Act, 21  U.S.C. section 360bbb-3(b)(1), unless the authorization is  terminated or revoked. Performed at [facility], [address], [place], [place]  [address]   Urine culture     Status: Abnormal   Collection Time: 01/21/19  9:30 PM   Specimen: Urine, Catheterized  Result Value Ref Range Status   Specimen Description URINE, CATHETERIZED  Final   Special Requests   Final    NONE Performed at [facility], [address], [place], [place] [address]    Culture MULTIPLE SPECIES PRESENT, SUGGEST RECOLLECTION (A)  Final   Report Status 01/22/2019 FINAL  Final  Blood culture (routine x 2)     Status: Abnormal   Collection Time: 01/21/19 10:00 PM   Specimen: BLOOD  Result Value Ref Range Status   Specimen Description BLOOD RIGHT ANTECUBITAL  Final   Special Requests   Final    BOTTLES DRAWN AEROBIC AND ANAEROBIC Blood Culture adequate volume   Culture  Setup Time   Final    GRAM POSITIVE COCCI IN CLUSTERS IN BOTH AEROBIC AND ANAEROBIC BOTTLES CRITICAL RESULT CALLED TO, READ BACK BY AND VERIFIED WITH: Enzo Montgomery FRENS [id number] FCP Performed at [facility], [address], [place], [place] [address]    Culture STAPHYLOCOCCUS AUREUS (A)  Final   Report Status 01/24/2019 FINAL  Final   Organism ID, Bacteria STAPHYLOCOCCUS AUREUS  Final      Susceptibility   Staphylococcus aureus - MIC*    CIPROFLOXACIN <=0.5 SENSITIVE Sensitive     ERYTHROMYCIN <=0.25 SENSITIVE Sensitive     GENTAMICIN <=0.5 SENSITIVE Sensitive     OXACILLIN <=0.25 SENSITIVE Sensitive     TETRACYCLINE <=1 SENSITIVE Sensitive     VANCOMYCIN 1 SENSITIVE Sensitive     TRIMETH/SULFA <=10 SENSITIVE Sensitive     CLINDAMYCIN <=0.25 SENSITIVE Sensitive     RIFAMPIN <=0.5 SENSITIVE Sensitive     Inducible Clindamycin NEGATIVE Sensitive     * STAPHYLOCOCCUS AUREUS  Respiratory Panel by RT PCR (Flu A&B, Covid) - Nasopharyngeal Swab     Status: None   Collection Time: 01/22/19  8:42 AM   Specimen: Nasopharyngeal Swab  Result Value Ref Range Status   SARS Coronavirus 2 by RT PCR NEGATIVE NEGATIVE Final    Comment: (NOTE) SARS-CoV-2 target nucleic acids are  NOT DETECTED. The SARS-CoV-2 RNA is generally detectable in upper respiratoy specimens during the acute phase of infection. The lowest concentration of SARS-CoV-2 viral copies this assay can detect is 131 copies/mL. A negative result does not preclude SARS-Cov-2 infection and should not be used as the sole basis for treatment or other patient management decisions. A negative result may occur with  improper specimen collection/handling, submission of specimen other than nasopharyngeal swab, presence of viral mutation(s) within the areas targeted by this assay, and inadequate number of viral copies (<131 copies/mL). A negative result must be combined with clinical observations, patient history, and epidemiological information. The expected result is Negative. Fact Sheet for Patients:  https://www.moore.com/ Fact Sheet for Healthcare Providers:  https://www.young.biz/ This test is not yet ap proved or cleared by the [place] FDA and  has been authorized for detection and/or diagnosis of SARS-CoV-2 by FDA under an Emergency Use Authorization (EUA). This EUA will remain  in effect (meaning this test can be used) for the duration of  the COVID-19 declaration under Section 564(b)(1) of the Act, 21 U.S.C. section 360bbb-3(b)(1), unless the authorization is terminated or revoked sooner.    Influenza A by PCR NEGATIVE NEGATIVE Final   Influenza B by PCR NEGATIVE NEGATIVE Final    Comment: (NOTE) The Xpert Xpress SARS-CoV-2/FLU/RSV assay is intended as an aid in  the diagnosis of influenza from Nasopharyngeal swab specimens and  should not be used as a sole basis for treatment. Nasal washings and  aspirates are unacceptable for Xpert Xpress SARS-CoV-2/FLU/RSV  testing. Fact Sheet for Patients: PinkCheek.be Fact Sheet for Healthcare Providers: GravelBags.it This test is not yet approved or  cleared by the [place] FDA and  has been authorized for detection and/or diagnosis of SARS-CoV-2 by  FDA under an Emergency Use Authorization (EUA). This EUA will remain  in effect (meaning this test can be used) for the duration of the  Covid-19 declaration under Section 564(b)(1) of the Act, 21  U.S.C. section 360bbb-3(b)(1), unless the authorization is  terminated or revoked. Performed at [facility], [place] [address], [place], [place] [address]   MRSA PCR Screening     Status: None   Collection Time: 01/22/19  9:32 AM   Specimen: Nasopharyngeal  Result Value Ref Range Status   MRSA by PCR NEGATIVE NEGATIVE Final    Comment:        The GeneXpert MRSA Assay (FDA approved for NASAL specimens only), is one component of a comprehensive MRSA colonization surveillance program. It is not intended to diagnose MRSA infection nor to guide or monitor treatment for MRSA infections. Performed at [facility], [place] [address] [place], [place] [address]   Blood culture (routine x 2)     Status: None (Preliminary result)   Collection Time: 01/22/19 10:21 AM   Specimen: BLOOD RIGHT HAND  Result Value Ref Range Status   Specimen Description BLOOD RIGHT HAND  Final   Special Requests   Final    BOTTLES DRAWN AEROBIC ONLY Blood Culture results may not be optimal due to an inadequate volume of blood received in culture bottles   Culture   Final    NO GROWTH 4 DAYS Performed at [facility], [place] [address], [place], [place] [address]    Report Status PENDING  Incomplete  Culture, respiratory (non-expectorated)     Status: None (Preliminary result)   Collection Time: 01/24/19  8:51 AM   Specimen: Tracheal Aspirate; Respiratory  Result Value Ref Range Status   Specimen Description TRACHEAL ASPIRATE  Final   Special Requests NONE  Final   Gram Stain   Final    MODERATE WBC PRESENT, PREDOMINANTLY PMN RARE GRAM POSITIVE COCCI IN PAIRS RARE GRAM NEGATIVE RODS     Culture   Final    CULTURE REINCUBATED FOR BETTER GROWTH Performed at [facility], [place] [address] [place], [place] [address]    Report Status PENDING  Incomplete    Impression/Plan:  1. MSSA bacteremia - on nafcillin due to concern for CNS infection based on MRI, though no acute findings.   Repeat blood culture has remained ngtd.  TTE without concerns I do recommend a TEE, I will alert the card master  2.  Acute encephalopathy - MRI findings noted.  I do not feel it is c/w embolic disease from endocarditis but I do feel a TEE is indicated as in #1.  Also, he continues on Keppra per neurology, though does not have a known seizure disorder and  no seizures noted on EEG.   Lithium level was 0 and apparently does not take any of his prescribed medication. Most likely from underlying bacteremia.    3.  Thrombocytopenia -  Present on admission and no previous platelet level available.  CT does not suggest cirrhosis of the liver.  Had not been on any medications.    4.  Respiratory failure -  Continues with high output of secretions, no worsening vent settings.  CXR with worsening right lower lobe consolidation yesterday.  On nafcillin as above.  If worsens, can consider Unasyn for more broad coverage (less CNS coverage but embolic infection does not seem likely).

## 2019-01-27 NOTE — Progress Notes (Signed)
RN called me to room stat d/t pt reportedly suddenly unresponsive.  Upon entering room, noted pt sat 66%- RN just placed pt on NRB,Sat improved to 88-89% on NRB.  While preparing to intubate, noted pt began to agonal breathe & blood coming from mouth and nose, at this time CCM was at bedside for intubation.  See CCM note.

## 2019-01-27 NOTE — Progress Notes (Signed)
eLink Physician-Brief Progress Note Patient Name: LAWARENCE MEEK DOB: 05/20/1955 MRN: [id number]   Date of Service  01/27/2019  HPI/Events of Note  "Massive" epistaxis earlier today. Last Hgb = 8.4.   eICU Interventions  Will order: 1. CBC with Platelets now.      Intervention Category Major Interventions: Other:  [name] 01/27/2019, 11:51 PM

## 2019-01-28 DIAGNOSIS — L308 Other specified dermatitis: Secondary | ICD-10-CM

## 2019-01-28 DIAGNOSIS — E162 Hypoglycemia, unspecified: Secondary | ICD-10-CM

## 2019-01-28 DIAGNOSIS — E870 Hyperosmolality and hypernatremia: Secondary | ICD-10-CM

## 2019-01-28 DIAGNOSIS — R040 Epistaxis: Secondary | ICD-10-CM

## 2019-01-28 LAB — CBC WITH DIFFERENTIAL/PLATELET
Abs Immature Granulocytes: 0.05 10*3/uL (ref 0.00–0.07)
Basophils Absolute: 0 10*3/uL (ref 0.0–0.1)
Basophils Relative: 0 %
Eosinophils Absolute: 0.6 10*3/uL — ABNORMAL HIGH (ref 0.0–0.5)
Eosinophils Relative: 10 %
HCT: 32 % — ABNORMAL LOW (ref 39.0–52.0)
Hemoglobin: 10 g/dL — ABNORMAL LOW (ref 13.0–17.0)
Immature Granulocytes: 1 %
Lymphocytes Relative: 20 %
Lymphs Abs: 1.3 10*3/uL (ref 0.7–4.0)
MCH: 26 pg (ref 26.0–34.0)
MCHC: 31.3 g/dL (ref 30.0–36.0)
MCV: 83.3 fL (ref 80.0–100.0)
Monocytes Absolute: 0.6 10*3/uL (ref 0.1–1.0)
Monocytes Relative: 9 %
Neutro Abs: 4 10*3/uL (ref 1.7–7.7)
Neutrophils Relative %: 60 %
Platelets: 40 10*3/uL — ABNORMAL LOW (ref 150–400)
RBC: 3.84 MIL/uL — ABNORMAL LOW (ref 4.22–5.81)
RDW: 16.3 % — ABNORMAL HIGH (ref 11.5–15.5)
WBC: 6.5 10*3/uL (ref 4.0–10.5)
nRBC: 0 % (ref 0.0–0.2)

## 2019-01-28 LAB — BASIC METABOLIC PANEL
Anion gap: 8 (ref 5–15)
BUN: 26 mg/dL — ABNORMAL HIGH (ref 8–23)
CO2: 26 mmol/L (ref 22–32)
Calcium: 7.8 mg/dL — ABNORMAL LOW (ref 8.9–10.3)
Chloride: 114 mmol/L — ABNORMAL HIGH (ref 98–111)
Creatinine, Ser: 0.75 mg/dL (ref 0.61–1.24)
GFR calc Af Amer: >60 mL/min (ref >60)
GFR calc non Af Amer: >60 mL/min (ref >60)
Glucose, Bld: 96 mg/dL (ref 70–99)
Potassium: 3.6 mmol/L (ref 3.5–5.1)
Sodium: 148 mmol/L — ABNORMAL HIGH (ref 135–145)

## 2019-01-28 LAB — GLUCOSE, CAPILLARY
Glucose-Capillary: 108 mg/dL — ABNORMAL HIGH (ref 70–99)
Glucose-Capillary: 121 mg/dL — ABNORMAL HIGH (ref 70–99)
Glucose-Capillary: 135 mg/dL — ABNORMAL HIGH (ref 70–99)
Glucose-Capillary: 74 mg/dL (ref 70–99)
Glucose-Capillary: 93 mg/dL (ref 70–99)
Glucose-Capillary: 96 mg/dL (ref 70–99)

## 2019-01-28 LAB — PHOSPHORUS
Phosphorus: 3.6 mg/dL (ref 2.5–4.6)
Phosphorus: 3.2 mg/dL (ref 2.5–4.6)

## 2019-01-28 LAB — MAGNESIUM
Magnesium: 2.5 mg/dL — ABNORMAL HIGH (ref 1.7–2.4)
Magnesium: 2.3 mg/dL (ref 1.7–2.4)

## 2019-01-28 MED ORDER — DEXTROSE-NACL 5-0.45 % IV SOLN: INTRAVENOUS | Status: DC

## 2019-01-28 MED ORDER — VITAL HIGH PROTEIN PO LIQD
1000.0000 mL | ORAL | Status: DC
  Administered 2019-01-28: 1000 mL

## 2019-01-28 MED ORDER — PRO-STAT SUGAR FREE PO LIQD
30.0000 mL | Freq: Two times a day (BID) | ORAL | Status: DC
  Administered 2019-01-28 – 2019-02-04 (×15): 30 mL
  Filled 2019-01-28 (×12): qty 30

## 2019-01-28 MED ORDER — PRO-STAT SUGAR FREE PO LIQD: 30.0000 mL | Freq: Two times a day (BID) | ORAL | Status: DC

## 2019-01-28 MED ORDER — FREE WATER
400.0000 mL | Status: DC
  Administered 2019-01-28 – 2019-01-29 (×7): 400 mL

## 2019-01-28 MED ORDER — VITAL AF 1.2 CAL PO LIQD
1000.0000 mL | ORAL | Status: DC
  Administered 2019-01-28 – 2019-02-04 (×9): 1000 mL
  Filled 2019-01-28 (×7): qty 1000

## 2019-01-28 MED ORDER — VITAL HIGH PROTEIN PO LIQD
1000.0000 mL | ORAL | Status: AC
  Administered 2019-01-28: 11:00:00 1000 mL
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
## 2019-01-28 NOTE — Progress Notes (Signed)
Nutrition Follow-up  DOCUMENTATION CODES:   Not applicable  INTERVENTION:   Continue tube feeding:    Vital AF 1.2 at 65 ml/hr via OGT (1560 ml per day)  30 ml Prostat BID  Provides: 2072 kcals, 147 grams protein, 1265 ml free water (3665 ml total free water with flushes)   NUTRITION DIAGNOSIS:   Increased nutrient needs related to acute illness as evidenced by estimated needs.  GOAL:   Patient will meet greater than or equal to 90% of their needs  MONITOR:   Diet advancement, Vent status, Skin, TF tolerance, Weight trends, Labs, I & O's  REASON FOR ASSESSMENT:   Ventilator, Consult Enteral/tube feeding initiation and management  ASSESSMENT:   Patient with PMH significant for reported bipolar disease and DM. Presents this admission with acute encephalopathy from unclear etiology and severe septic shock.   Patient was extubated 1/18, but had to be quickly re-intubated. TF has been on hold since extubation yesterday.  Planned TEE for today has been postponed, so TF is being resumed.   Patient remains intubated on ventilator support MV: 10.2 L/min Temp (24hrs), Avg:97.5 F (36.4 C), Min:94.3 F (34.6 C), Max:98.3 F (36.8 C)  Propofol: off   Medications: folic acid, SS novolog, free water 400 ml every 4 hours. IVF: D5 1/2 NS at 40 ml/h Labs: Na 148 (H)  CBG's: 74-93   Diet Order:   Diet Order            Diet NPO time specified  Diet effective now              EDUCATION NEEDS:   Not appropriate for education at this time  Skin:  Skin Assessment: Reviewed RN Assessment  Last BM:  1/19 type 6  Height:   Ht Readings from Last 1 Encounters:  01/21/19 6\' 2"  (1.88 m)    Weight:   Wt Readings from Last 1 Encounters:  01/28/19 102.2 kg  01/23/19 97 kg (lowest weight since admission) 01/21/19 100 kg   Ideal Body Weight:  86.4 kg  BMI:  Body mass index is 28.93 kg/m.  Estimated Nutritional Needs:   Kcal:  2050  Protein:  140-160  grams  Fluid:  >/= 1.9 L/day   2051, RD, LDN, CNSC Pager [phone] After Hours Pager [phone]
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
## 2019-01-28 NOTE — Progress Notes (Signed)
Spoke w/ Dr. Luciana Axe, ID, who states due to pts platelet count TEE will not be performed today, ok to restart TF.
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
## 2019-01-28 NOTE — Progress Notes (Signed)
Pts platelets continue to trend down, 40 this AM. Spoke w/ CCM resident who is aware.

## 2019-01-28 NOTE — Progress Notes (Signed)
SLP Cancellation Note  Patient Details Name: Marc [name] MRN: [id number] DOB: 03-08-1955   Cancelled treatment:       Reason Eval/Treat Not Completed: Medical issues which prohibited therapy. Pt intubated. Will sign off and await new orders.    Vauda Salvucci, Riley Nearing 01/28/2019, 8:00 AM

## 2019-01-28 NOTE — Progress Notes (Signed)
[facility]   Reason for visit: Follow up on MSSA bacteremia  Interval History: remains intubated, sedated.  WBC wnl, afebrile.  No acute events.    Physical Exam: Constitutional:  Vitals:   01/28/19 0700 01/28/19 0741  BP: 134/80 111/73  Pulse: (!) 52 (!) 58  Resp: (!) 0 16  Temp:    SpO2: 100% 100%   patient appears in NAD Eyes: anicteric HENT: +ET Respiratory: respiratory effort on vent Cardiovascular: RRR  Review of Systems: Unable to be assessed due to patient factors  Lab Results  Component Value Date   WBC 6.5 01/28/2019   HGB 10.0 (L) 01/28/2019   HCT 32.0 (L) 01/28/2019   MCV 83.3 01/28/2019   PLT 40 (L) 01/28/2019    Lab Results  Component Value Date   CREATININE 0.75 01/28/2019   BUN 26 (H) 01/28/2019   NA 148 (H) 01/28/2019   K 3.6 01/28/2019   CL 114 (H) 01/28/2019   CO2 26 01/28/2019    Lab Results  Component Value Date   ALT 46 (H) 01/22/2019   AST 57 (H) 01/22/2019   ALKPHOS 83 01/22/2019     Microbiology: Recent Results (from the past 240 hour(s))  Respiratory Panel by RT PCR (Flu A&B, Covid) - Nasopharyngeal Swab     Status: None   Collection Time: 01/21/19  8:50 PM   Specimen: Nasopharyngeal Swab  Result Value Ref Range Status   SARS Coronavirus 2 by RT PCR NEGATIVE NEGATIVE Final    Comment: (NOTE) SARS-CoV-2 target nucleic acids are NOT DETECTED. The SARS-CoV-2 RNA is generally detectable in upper respiratoy specimens during the acute phase of infection. The lowest concentration of SARS-CoV-2 viral copies this assay can detect is 131 copies/mL. A negative result does not preclude SARS-Cov-2 infection and should not be used as the sole basis for treatment or other patient management decisions. A negative result may occur with  improper specimen collection/handling, submission of specimen other than nasopharyngeal swab, presence of viral mutation(s) within the areas targeted by this assay, and  inadequate number of viral copies (<131 copies/mL). A negative result must be combined with clinical observations, patient history, and epidemiological information. The expected result is Negative. Fact Sheet for Patients:  https://www.moore.com/ Fact Sheet for Healthcare Providers:  https://www.young.biz/ This test is not yet ap proved or cleared by the [place] FDA and  has been authorized for detection and/or diagnosis of SARS-CoV-2 by FDA under an Emergency Use Authorization (EUA). This EUA will remain  in effect (meaning this test can be used) for the duration of the COVID-19 declaration under Section 564(b)(1) of the Act, 21 U.S.C. section 360bbb-3(b)(1), unless the authorization is terminated or revoked sooner.    Influenza A by PCR NEGATIVE NEGATIVE Final   Influenza B by PCR NEGATIVE NEGATIVE Final    Comment: (NOTE) The Xpert Xpress SARS-CoV-2/FLU/RSV assay is intended as an aid in  the diagnosis of influenza from Nasopharyngeal swab specimens and  should not be used as a sole basis for treatment. Nasal washings and  aspirates are unacceptable for Xpert Xpress SARS-CoV-2/FLU/RSV  testing. Fact Sheet for Patients: https://www.moore.com/ Fact Sheet for Healthcare Providers: https://www.young.biz/ This test is not yet approved or cleared by the [place] FDA and  has been authorized for detection and/or diagnosis of SARS-CoV-2 by  FDA under an Emergency Use Authorization (EUA). This EUA will remain  in effect (meaning this test can be used) for the duration of the  Covid-19 declaration  under Section 564(b)(1) of the Act, 21  U.S.C. section 360bbb-3(b)(1), unless the authorization is  terminated or revoked. Performed at [facility], [place] [address] [place], [place] [address]   Urine culture     Status: Abnormal   Collection Time: 01/21/19  9:30 PM   Specimen: Urine, Catheterized    Result Value Ref Range Status   Specimen Description URINE, CATHETERIZED  Final   Special Requests   Final    NONE Performed at [facility], [place] [address], [place], [place] [address]    Culture MULTIPLE SPECIES PRESENT, SUGGEST RECOLLECTION (A)  Final   Report Status 01/22/2019 FINAL  Final  Blood culture (routine x 2)     Status: Abnormal   Collection Time: 01/21/19 10:00 PM   Specimen: BLOOD  Result Value Ref Range Status   Specimen Description BLOOD RIGHT ANTECUBITAL  Final   Special Requests   Final    BOTTLES DRAWN AEROBIC AND ANAEROBIC Blood Culture adequate volume   Culture  Setup Time   Final    GRAM POSITIVE COCCI IN CLUSTERS IN BOTH AEROBIC AND ANAEROBIC BOTTLES CRITICAL RESULT CALLED TO, READ BACK BY AND VERIFIED WITH: Clydene Fake FRENS [id number] FCP Performed at [facility], [place] [address], [place], [place] [address]    Culture STAPHYLOCOCCUS AUREUS (A)  Final   Report Status 01/24/2019 FINAL  Final   Organism ID, Bacteria STAPHYLOCOCCUS AUREUS  Final      Susceptibility   Staphylococcus aureus - MIC*    CIPROFLOXACIN <=0.5 SENSITIVE Sensitive     ERYTHROMYCIN <=0.25 SENSITIVE Sensitive     GENTAMICIN <=0.5 SENSITIVE Sensitive     OXACILLIN <=0.25 SENSITIVE Sensitive     TETRACYCLINE <=1 SENSITIVE Sensitive     VANCOMYCIN 1 SENSITIVE Sensitive     TRIMETH/SULFA <=10 SENSITIVE Sensitive     CLINDAMYCIN <=0.25 SENSITIVE Sensitive     RIFAMPIN <=0.5 SENSITIVE Sensitive     Inducible Clindamycin NEGATIVE Sensitive     * STAPHYLOCOCCUS AUREUS  Respiratory Panel by RT PCR (Flu A&B, Covid) - Nasopharyngeal Swab     Status: None   Collection Time: 01/22/19  8:42 AM   Specimen: Nasopharyngeal Swab  Result Value Ref Range Status   SARS Coronavirus 2 by RT PCR NEGATIVE NEGATIVE Final    Comment: (NOTE) SARS-CoV-2 target nucleic acids are NOT DETECTED. The SARS-CoV-2 RNA is generally detectable in upper respiratoy specimens during the acute phase of  infection. The lowest concentration of SARS-CoV-2 viral copies this assay can detect is 131 copies/mL. A negative result does not preclude SARS-Cov-2 infection and should not be used as the sole basis for treatment or other patient management decisions. A negative result may occur with  improper specimen collection/handling, submission of specimen other than nasopharyngeal swab, presence of viral mutation(s) within the areas targeted by this assay, and inadequate number of viral copies (<131 copies/mL). A negative result must be combined with clinical observations, patient history, and epidemiological information. The expected result is Negative. Fact Sheet for Patients:  PinkCheek.be Fact Sheet for Healthcare Providers:  GravelBags.it This test is not yet ap proved or cleared by the [place] FDA and  has been authorized for detection and/or diagnosis of SARS-CoV-2 by FDA under an Emergency Use Authorization (EUA). This EUA will remain  in effect (meaning this test can be used) for the duration of the COVID-19 declaration under Section 564(b)(1) of the Act, 21 U.S.C. section 360bbb-3(b)(1), unless the authorization is terminated or  revoked sooner.    Influenza A by PCR NEGATIVE NEGATIVE Final   Influenza B by PCR NEGATIVE NEGATIVE Final    Comment: (NOTE) The Xpert Xpress SARS-CoV-2/FLU/RSV assay is intended as an aid in  the diagnosis of influenza from Nasopharyngeal swab specimens and  should not be used as a sole basis for treatment. Nasal washings and  aspirates are unacceptable for Xpert Xpress SARS-CoV-2/FLU/RSV  testing. Fact Sheet for Patients: https://www.moore.com/ Fact Sheet for Healthcare Providers: https://www.young.biz/ This test is not yet approved or cleared by the [place] FDA and  has been authorized for detection and/or diagnosis of SARS-CoV-2 by  FDA under  an Emergency Use Authorization (EUA). This EUA will remain  in effect (meaning this test can be used) for the duration of the  Covid-19 declaration under Section 564(b)(1) of the Act, 21  U.S.C. section 360bbb-3(b)(1), unless the authorization is  terminated or revoked. Performed at [facility], [address] [place], [place] [address]   MRSA PCR Screening     Status: None   Collection Time: 01/22/19  9:32 AM   Specimen: Nasopharyngeal  Result Value Ref Range Status   MRSA by PCR NEGATIVE NEGATIVE Final    Comment:        The GeneXpert MRSA Assay (FDA approved for NASAL specimens only), is one component of a comprehensive MRSA colonization surveillance program. It is not intended to diagnose MRSA infection nor to guide or monitor treatment for MRSA infections. Performed at [facility], [address] [place], [place] [address]   Blood culture (routine x 2)     Status: None   Collection Time: 01/22/19 10:21 AM   Specimen: BLOOD RIGHT HAND  Result Value Ref Range Status   Specimen Description BLOOD RIGHT HAND  Final   Special Requests   Final    BOTTLES DRAWN AEROBIC ONLY Blood Culture results may not be optimal due to an inadequate volume of blood received in culture bottles   Culture   Final    NO GROWTH 5 DAYS Performed at [facility], [address], [place], [place] [address]    Report Status 01/27/2019 FINAL  Final  Culture, respiratory (non-expectorated)     Status: None (Preliminary result)   Collection Time: 01/24/19  8:51 AM   Specimen: Tracheal Aspirate; Respiratory  Result Value Ref Range Status   Specimen Description TRACHEAL ASPIRATE  Final   Special Requests NONE  Final   Gram Stain   Final    MODERATE WBC PRESENT, PREDOMINANTLY PMN RARE GRAM POSITIVE COCCI IN PAIRS RARE GRAM NEGATIVE RODS    Culture   Final    CULTURE REINCUBATED FOR BETTER GROWTH Performed at [facility], [address] [place], [place] [address]     Report Status PENDING  Incomplete    Impression/Plan:  1. MSSA bacteremia - will continue with nafcillin.  Duration depends on TEE results.  We have contacted cardiology for a TEE, though with low platelets, may need to wait.   Repeat blood culture remains no growth, now final.    2.  Acute encephalopathy - he remains sedated at this time.    3.  Thrombocytopenia -  Continues to be low of unknown etiology.  Certainly bacteremia could be a cause.  His baseline on admission was low and no previous history so unknown if it is new or not. No signs of cirrhosis on CT.    4.  Respiratory failure -  reintubated  after failed extubation yesterday.

## 2019-01-28 NOTE — Progress Notes (Signed)
eLink Physician-Brief Progress Note Patient Name: Marc [name] DOB: [birth year] MRN: [id number]   Date of Service  01/28/2019  HPI/Events of Note  Hypoglycemia - Blood glucose = 74. Na+ = 148.  eICU Interventions  Will order: 1. D5 0.45 NaCl to run IV at 40 mL/hour.      Intervention Category Major Interventions: Other:  [name] 01/28/2019, 4:11 AM

## 2019-01-29 ENCOUNTER — Encounter (HOSPITAL_COMMUNITY): Payer: Self-pay | Admitting: Pulmonary Disease

## 2019-01-29 ENCOUNTER — Other Ambulatory Visit: Payer: Self-pay

## 2019-01-29 DIAGNOSIS — G9341 Metabolic encephalopathy: Secondary | ICD-10-CM

## 2019-01-29 DIAGNOSIS — E871 Hypo-osmolality and hyponatremia: Secondary | ICD-10-CM

## 2019-01-29 DIAGNOSIS — L8990 Pressure ulcer of unspecified site, unspecified stage: Secondary | ICD-10-CM | POA: Diagnosis present

## 2019-01-29 DIAGNOSIS — F203 Undifferentiated schizophrenia: Secondary | ICD-10-CM

## 2019-01-29 LAB — BASIC METABOLIC PANEL
Anion gap: 8 (ref 5–15)
BUN: 27 mg/dL — ABNORMAL HIGH (ref 8–23)
CO2: 24 mmol/L (ref 22–32)
Calcium: 7.8 mg/dL — ABNORMAL LOW (ref 8.9–10.3)
Chloride: 112 mmol/L — ABNORMAL HIGH (ref 98–111)
Creatinine, Ser: 0.72 mg/dL (ref 0.61–1.24)
GFR calc Af Amer: >60 mL/min (ref >60)
GFR calc non Af Amer: >60 mL/min (ref >60)
Glucose, Bld: 160 mg/dL — ABNORMAL HIGH (ref 70–99)
Potassium: 3.8 mmol/L (ref 3.5–5.1)
Sodium: 144 mmol/L (ref 135–145)

## 2019-01-29 LAB — MAGNESIUM: Magnesium: 2.4 mg/dL (ref 1.7–2.4)

## 2019-01-29 LAB — GLUCOSE, CAPILLARY
Glucose-Capillary: 120 mg/dL — ABNORMAL HIGH (ref 70–99)
Glucose-Capillary: 127 mg/dL — ABNORMAL HIGH (ref 70–99)
Glucose-Capillary: 128 mg/dL — ABNORMAL HIGH (ref 70–99)
Glucose-Capillary: 133 mg/dL — ABNORMAL HIGH (ref 70–99)
Glucose-Capillary: 133 mg/dL — ABNORMAL HIGH (ref 70–99)
Glucose-Capillary: 149 mg/dL — ABNORMAL HIGH (ref 70–99)
Glucose-Capillary: 89 mg/dL (ref 70–99)

## 2019-01-29 LAB — CBC
HCT: 33.5 % — ABNORMAL LOW (ref 39.0–52.0)
Hemoglobin: 10.5 g/dL — ABNORMAL LOW (ref 13.0–17.0)
MCH: 26.5 pg (ref 26.0–34.0)
MCHC: 31.3 g/dL (ref 30.0–36.0)
MCV: 84.6 fL (ref 80.0–100.0)
Platelets: 61 10*3/uL — ABNORMAL LOW (ref 150–400)
RBC: 3.96 MIL/uL — ABNORMAL LOW (ref 4.22–5.81)
RDW: 16.6 % — ABNORMAL HIGH (ref 11.5–15.5)
WBC: 7.6 10*3/uL (ref 4.0–10.5)
nRBC: 0 % (ref 0.0–0.2)

## 2019-01-29 LAB — PHOSPHORUS: Phosphorus: 3.9 mg/dL (ref 2.5–4.6)

## 2019-01-29 MED ORDER — GLYCOPYRROLATE 1 MG PO TABS
1.0000 mg | ORAL_TABLET | Freq: Two times a day (BID) | ORAL | Status: AC | PRN
  Administered 2019-01-29 – 2019-01-31 (×4): 1 mg
  Filled 2019-01-29 (×4): qty 1

## 2019-01-29 MED ORDER — GLYCOPYRROLATE 1 MG PO TABS: 1.0000 mg | ORAL_TABLET | Freq: Two times a day (BID) | ORAL | Status: DC | PRN

## 2019-01-29 MED ORDER — POTASSIUM CHLORIDE 10 MEQ/100ML IV SOLN
10.0000 meq | INTRAVENOUS | Status: AC
  Administered 2019-01-29 (×4): 10 meq via INTRAVENOUS
  Filled 2019-01-29 (×4): qty 100

## 2019-01-29 MED ORDER — FREE WATER
400.0000 mL | Freq: Four times a day (QID) | Status: DC
  Administered 2019-01-29 – 2019-02-03 (×17): 400 mL

## 2019-01-29 MED ORDER — SODIUM CHLORIDE 0.9 % IV SOLN
12.0000 g | INTRAVENOUS | Status: DC
  Administered 2019-01-30: 12 g via INTRAVENOUS
  Filled 2019-01-29: qty 12000

## 2019-01-29 MED ORDER — FUROSEMIDE 10 MG/ML IJ SOLN
40.0000 mg | Freq: Once | INTRAMUSCULAR | Status: AC
  Administered 2019-01-29: 11:00:00 40 mg via INTRAVENOUS
  Filled 2019-01-29: qty 4

## 2019-01-29 MED ORDER — FENTANYL CITRATE (PF) 100 MCG/2ML IJ SOLN
25.0000 ug | INTRAMUSCULAR | Status: DC | PRN
  Administered 2019-01-29: 50 ug via INTRAVENOUS
  Administered 2019-01-30 – 2019-02-02 (×8): 100 ug via INTRAVENOUS
  Filled 2019-01-29 (×10): qty 2
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
## 2019-01-29 NOTE — Progress Notes (Signed)
Assisted tele visit to patient with sister.  Joelynn Dust M Elias Dennington, RN   

## 2019-01-29 NOTE — Progress Notes (Signed)
eLink Physician-Brief Progress Note Patient Name: Marc [name] DOB: 1955-07-31 MRN: [id number]   Date of Service  01/29/2019  HPI/Events of Note  Copious mucoid, non-bloody secretions from right nostril. Pt is on the ventilator. Right sided  Sinusitis?  eICU Interventions  Pt is currently on Nafcillin, CT sinuses to r/o sinusitis, this can be done after his TEE in the next day or two.        Thomasene Lot Rilyn Upshaw 01/29/2019, 10:23 PM

## 2019-01-29 NOTE — Progress Notes (Signed)
[facility]   Reason for visit: Follow up on MSSA bacteremia  Interval History: remains intubated but following commands.  WBC wnl, has remained afebrile.    Physical Exam: Constitutional:  Vitals:   01/29/19 1153 01/29/19 1200  BP:  (!) 87/55  Pulse:  (!) 52  Resp:  16  Temp: (!) 96.3 F (35.7 C) (!) 96.3 F (35.7 C)  SpO2:  97%   patient appears in NAD Eyes: anicteric HENT: +ET Respiratory: respiratory effort on vent Cardiovascular: RRR  Review of Systems: Unable to be assessed due to patient factors  Lab Results  Component Value Date   WBC 7.6 01/29/2019   HGB 10.5 (L) 01/29/2019   HCT 33.5 (L) 01/29/2019   MCV 84.6 01/29/2019   PLT 61 (L) 01/29/2019    Lab Results  Component Value Date   CREATININE 0.72 01/29/2019   BUN 27 (H) 01/29/2019   NA 144 01/29/2019   K 3.8 01/29/2019   CL 112 (H) 01/29/2019   CO2 24 01/29/2019    Lab Results  Component Value Date   ALT 46 (H) 01/22/2019   AST 57 (H) 01/22/2019   ALKPHOS 83 01/22/2019     Microbiology: Recent Results (from the past 240 hour(s))  Respiratory Panel by RT PCR (Flu A&B, Covid) - Nasopharyngeal Swab     Status: None   Collection Time: 01/21/19  8:50 PM   Specimen: Nasopharyngeal Swab  Result Value Ref Range Status   SARS Coronavirus 2 by RT PCR NEGATIVE NEGATIVE Final    Comment: (NOTE) SARS-CoV-2 target nucleic acids are NOT DETECTED. The SARS-CoV-2 RNA is generally detectable in upper respiratoy specimens during the acute phase of infection. The lowest concentration of SARS-CoV-2 viral copies this assay can detect is 131 copies/mL. A negative result does not preclude SARS-Cov-2 infection and should not be used as the sole basis for treatment or other patient management decisions. A negative result may occur with  improper specimen collection/handling, submission of specimen other than nasopharyngeal swab, presence of viral mutation(s) within the areas  targeted by this assay, and inadequate number of viral copies (<131 copies/mL). A negative result must be combined with clinical observations, patient history, and epidemiological information. The expected result is Negative. Fact Sheet for Patients:  https://www.moore.com/ Fact Sheet for Healthcare Providers:  https://www.young.biz/ This test is not yet ap proved or cleared by the [place] FDA and  has been authorized for detection and/or diagnosis of SARS-CoV-2 by FDA under an Emergency Use Authorization (EUA). This EUA will remain  in effect (meaning this test can be used) for the duration of the COVID-19 declaration under Section 564(b)(1) of the Act, 21 U.S.C. section 360bbb-3(b)(1), unless the authorization is terminated or revoked sooner.    Influenza A by PCR NEGATIVE NEGATIVE Final   Influenza B by PCR NEGATIVE NEGATIVE Final    Comment: (NOTE) The Xpert Xpress SARS-CoV-2/FLU/RSV assay is intended as an aid in  the diagnosis of influenza from Nasopharyngeal swab specimens and  should not be used as a sole basis for treatment. Nasal washings and  aspirates are unacceptable for Xpert Xpress SARS-CoV-2/FLU/RSV  testing. Fact Sheet for Patients: https://www.moore.com/ Fact Sheet for Healthcare Providers: https://www.young.biz/ This test is not yet approved or cleared by the [place] FDA and  has been authorized for detection and/or diagnosis of SARS-CoV-2 by  FDA under an Emergency Use Authorization (EUA). This EUA will remain  in effect (meaning this test can be used) for the  duration of the  Covid-19 declaration under Section 564(b)(1) of the Act, 21  U.S.C. section 360bbb-3(b)(1), unless the authorization is  terminated or revoked. Performed at [facility], [address], [place], [place] [address]   Urine culture     Status: Abnormal   Collection Time: 01/21/19  9:30 PM    Specimen: Urine, Catheterized  Result Value Ref Range Status   Specimen Description URINE, CATHETERIZED  Final   Special Requests   Final    NONE Performed at [facility], [address] [place], [place] [address]    Culture MULTIPLE SPECIES PRESENT, SUGGEST RECOLLECTION (A)  Final   Report Status 01/22/2019 FINAL  Final  Blood culture (routine x 2)     Status: Abnormal   Collection Time: 01/21/19 10:00 PM   Specimen: BLOOD  Result Value Ref Range Status   Specimen Description BLOOD RIGHT ANTECUBITAL  Final   Special Requests   Final    BOTTLES DRAWN AEROBIC AND ANAEROBIC Blood Culture adequate volume   Culture  Setup Time   Final    GRAM POSITIVE COCCI IN CLUSTERS IN BOTH AEROBIC AND ANAEROBIC BOTTLES CRITICAL RESULT CALLED TO, READ BACK BY AND VERIFIED WITH: Enzo Montgomery FRENS [phone] FCP Performed at [facility], [address], [place], [place] [address]    Culture STAPHYLOCOCCUS AUREUS (A)  Final   Report Status 01/24/2019 FINAL  Final   Organism ID, Bacteria STAPHYLOCOCCUS AUREUS  Final      Susceptibility   Staphylococcus aureus - MIC*    CIPROFLOXACIN <=0.5 SENSITIVE Sensitive     ERYTHROMYCIN <=0.25 SENSITIVE Sensitive     GENTAMICIN <=0.5 SENSITIVE Sensitive     OXACILLIN <=0.25 SENSITIVE Sensitive     TETRACYCLINE <=1 SENSITIVE Sensitive     VANCOMYCIN 1 SENSITIVE Sensitive     TRIMETH/SULFA <=10 SENSITIVE Sensitive     CLINDAMYCIN <=0.25 SENSITIVE Sensitive     RIFAMPIN <=0.5 SENSITIVE Sensitive     Inducible Clindamycin NEGATIVE Sensitive     * STAPHYLOCOCCUS AUREUS  Respiratory Panel by RT PCR (Flu A&B, Covid) - Nasopharyngeal Swab     Status: None   Collection Time: 01/22/19  8:42 AM   Specimen: Nasopharyngeal Swab  Result Value Ref Range Status   SARS Coronavirus 2 by RT PCR NEGATIVE NEGATIVE Final    Comment: (NOTE) SARS-CoV-2 target nucleic acids are NOT DETECTED. The SARS-CoV-2 RNA is generally detectable in upper  respiratoy specimens during the acute phase of infection. The lowest concentration of SARS-CoV-2 viral copies this assay can detect is 131 copies/mL. A negative result does not preclude SARS-Cov-2 infection and should not be used as the sole basis for treatment or other patient management decisions. A negative result may occur with  improper specimen collection/handling, submission of specimen other than nasopharyngeal swab, presence of viral mutation(s) within the areas targeted by this assay, and inadequate number of viral copies (<131 copies/mL). A negative result must be combined with clinical observations, patient history, and epidemiological information. The expected result is Negative. Fact Sheet for Patients:  https://www.moore.com/ Fact Sheet for Healthcare Providers:  https://www.young.biz/ This test is not yet ap proved or cleared by the [place] FDA and  has been authorized for detection and/or diagnosis of SARS-CoV-2 by FDA under an Emergency Use Authorization (EUA). This EUA will remain  in effect (meaning this test can be used) for the duration of the COVID-19 declaration under Section 564(b)(1) of the Act, 21 U.S.C. section 360bbb-3(b)(1), unless  the authorization is terminated or revoked sooner.    Influenza A by PCR NEGATIVE NEGATIVE Final   Influenza B by PCR NEGATIVE NEGATIVE Final    Comment: (NOTE) The Xpert Xpress SARS-CoV-2/FLU/RSV assay is intended as an aid in  the diagnosis of influenza from Nasopharyngeal swab specimens and  should not be used as a sole basis for treatment. Nasal washings and  aspirates are unacceptable for Xpert Xpress SARS-CoV-2/FLU/RSV  testing. Fact Sheet for Patients: PinkCheek.be Fact Sheet for Healthcare Providers: GravelBags.it This test is not yet approved or cleared by the [place] FDA and  has been authorized for  detection and/or diagnosis of SARS-CoV-2 by  FDA under an Emergency Use Authorization (EUA). This EUA will remain  in effect (meaning this test can be used) for the duration of the  Covid-19 declaration under Section 564(b)(1) of the Act, 21  U.S.C. section 360bbb-3(b)(1), unless the authorization is  terminated or revoked. Performed at [facility], [place] [address], [place], [place] [address]   MRSA PCR Screening     Status: None   Collection Time: 01/22/19  9:32 AM   Specimen: Nasopharyngeal  Result Value Ref Range Status   MRSA by PCR NEGATIVE NEGATIVE Final    Comment:        The GeneXpert MRSA Assay (FDA approved for NASAL specimens only), is one component of a comprehensive MRSA colonization surveillance program. It is not intended to diagnose MRSA infection nor to guide or monitor treatment for MRSA infections. Performed at [facility], [place] [address], [place], [place] [address]   Blood culture (routine x 2)     Status: None   Collection Time: 01/22/19 10:21 AM   Specimen: BLOOD RIGHT HAND  Result Value Ref Range Status   Specimen Description BLOOD RIGHT HAND  Final   Special Requests   Final    BOTTLES DRAWN AEROBIC ONLY Blood Culture results may not be optimal due to an inadequate volume of blood received in culture bottles   Culture   Final    NO GROWTH 5 DAYS Performed at [facility], [place] [address], [place], [place] [address]    Report Status 01/27/2019 FINAL  Final  Culture, respiratory (non-expectorated)     Status: None (Preliminary result)   Collection Time: 01/24/19  8:51 AM   Specimen: Tracheal Aspirate; Respiratory  Result Value Ref Range Status   Specimen Description TRACHEAL ASPIRATE  Final   Special Requests NONE  Final   Gram Stain   Final    MODERATE WBC PRESENT, PREDOMINANTLY PMN RARE GRAM POSITIVE COCCI IN PAIRS RARE GRAM NEGATIVE RODS    Culture   Final    CULTURE REINCUBATED FOR BETTER GROWTH Performed at [facility], [place] [address] [place], [place] [address]    Report Status PENDING  Incomplete    Impression/Plan:  1. MSSA bacteremia - continues on nafcillin and tolerating. No changes.  Duration will depend on TEE results.    2.  Acute encephalopathy - much improved.    3.  Thrombocytopenia -  A little improvement today.  Will continue to monitor.    4.  Respiratory failure -  More alert and stable vent setting.

## 2019-01-30 ENCOUNTER — Inpatient Hospital Stay (HOSPITAL_COMMUNITY): Payer: Medicare Other

## 2019-01-30 DIAGNOSIS — I330 Acute and subacute infective endocarditis: Secondary | ICD-10-CM

## 2019-01-30 DIAGNOSIS — R7881 Bacteremia: Secondary | ICD-10-CM

## 2019-01-30 DIAGNOSIS — I340 Nonrheumatic mitral (valve) insufficiency: Secondary | ICD-10-CM

## 2019-01-30 LAB — CBC
HCT: 29.8 % — ABNORMAL LOW (ref 39.0–52.0)
Hemoglobin: 9.1 g/dL — ABNORMAL LOW (ref 13.0–17.0)
MCH: 25.7 pg — ABNORMAL LOW (ref 26.0–34.0)
MCHC: 30.5 g/dL (ref 30.0–36.0)
MCV: 84.2 fL (ref 80.0–100.0)
Platelets: 104 10*3/uL — ABNORMAL LOW (ref 150–400)
RBC: 3.54 MIL/uL — ABNORMAL LOW (ref 4.22–5.81)
RDW: 16.5 % — ABNORMAL HIGH (ref 11.5–15.5)
WBC: 7.2 10*3/uL (ref 4.0–10.5)
nRBC: 0 % (ref 0.0–0.2)

## 2019-01-30 LAB — GLUCOSE, CAPILLARY
Glucose-Capillary: 117 mg/dL — ABNORMAL HIGH (ref 70–99)
Glucose-Capillary: 97 mg/dL (ref 70–99)
Glucose-Capillary: 120 mg/dL — ABNORMAL HIGH (ref 70–99)
Glucose-Capillary: 114 mg/dL — ABNORMAL HIGH (ref 70–99)
Glucose-Capillary: 102 mg/dL — ABNORMAL HIGH (ref 70–99)
Glucose-Capillary: 112 mg/dL — ABNORMAL HIGH (ref 70–99)

## 2019-01-30 LAB — BASIC METABOLIC PANEL
Anion gap: 10 (ref 5–15)
Anion gap: 10 (ref 5–15)
BUN: 24 mg/dL — ABNORMAL HIGH (ref 8–23)
BUN: 20 mg/dL (ref 8–23)
CO2: 27 mmol/L (ref 22–32)
CO2: 28 mmol/L (ref 22–32)
Calcium: 7.7 mg/dL — ABNORMAL LOW (ref 8.9–10.3)
Calcium: 8.1 mg/dL — ABNORMAL LOW (ref 8.9–10.3)
Chloride: 113 mmol/L — ABNORMAL HIGH (ref 98–111)
Chloride: 109 mmol/L (ref 98–111)
Creatinine, Ser: 0.84 mg/dL (ref 0.61–1.24)
Creatinine, Ser: 0.93 mg/dL (ref 0.61–1.24)
GFR calc Af Amer: >60 mL/min (ref >60)
GFR calc Af Amer: >60 mL/min (ref >60)
GFR calc non Af Amer: >60 mL/min (ref >60)
GFR calc non Af Amer: >60 mL/min (ref >60)
Glucose, Bld: 115 mg/dL — ABNORMAL HIGH (ref 70–99)
Glucose, Bld: 124 mg/dL — ABNORMAL HIGH (ref 70–99)
Potassium: 3.6 mmol/L (ref 3.5–5.1)
Potassium: 3.8 mmol/L (ref 3.5–5.1)
Sodium: 150 mmol/L — ABNORMAL HIGH (ref 135–145)
Sodium: 147 mmol/L — ABNORMAL HIGH (ref 135–145)

## 2019-01-30 LAB — MAGNESIUM: Magnesium: 2.3 mg/dL (ref 1.7–2.4)

## 2019-01-30 LAB — TRIGLYCERIDES: Triglycerides: 73 mg/dL (ref <150)

## 2019-01-30 LAB — PHOSPHORUS: Phosphorus: 3.7 mg/dL (ref 2.5–4.6)

## 2019-01-30 MED ORDER — POTASSIUM CHLORIDE CRYS ER 20 MEQ PO TBCR: 20.0000 meq | EXTENDED_RELEASE_TABLET | Freq: Once | ORAL | Status: DC

## 2019-01-30 MED ORDER — METOLAZONE 2.5 MG PO TABS
2.5000 mg | ORAL_TABLET | Freq: Once | ORAL | Status: AC
  Administered 2019-01-30: 2.5 mg via ORAL
  Filled 2019-01-30: qty 1

## 2019-01-30 MED ORDER — CEFAZOLIN SODIUM-DEXTROSE 2-4 GM/100ML-% IV SOLN
2.0000 g | Freq: Three times a day (TID) | INTRAVENOUS | Status: DC
  Administered 2019-01-30 – 2019-02-23 (×72): 2 g via INTRAVENOUS
  Filled 2019-01-30 (×78): qty 100

## 2019-01-30 MED ORDER — POTASSIUM CHLORIDE 10 MEQ/100ML IV SOLN
10.0000 meq | INTRAVENOUS | Status: DC
  Administered 2019-01-30 (×2): 10 meq via INTRAVENOUS

## 2019-01-30 MED ORDER — POTASSIUM CHLORIDE 10 MEQ/100ML IV SOLN
10.0000 meq | INTRAVENOUS | Status: DC
  Administered 2019-01-30 (×2): 10 meq via INTRAVENOUS
  Filled 2019-01-30 (×4): qty 100

## 2019-01-30 MED ORDER — POTASSIUM CHLORIDE 20 MEQ PO PACK
20.0000 meq | PACK | Freq: Two times a day (BID) | ORAL | Status: DC
  Filled 2019-01-30: qty 1

## 2019-01-30 MED ORDER — FUROSEMIDE 10 MG/ML IJ SOLN
20.0000 mg | Freq: Once | INTRAMUSCULAR | Status: AC
  Administered 2019-01-30: 20 mg via INTRAVENOUS
  Filled 2019-01-30: qty 2

## 2019-01-30 MED ORDER — FOLIC ACID 1 MG PO TABS
1.0000 mg | ORAL_TABLET | Freq: Every day | ORAL | Status: DC
  Administered 2019-01-31 – 2019-02-13 (×14): 1 mg
  Filled 2019-01-30 (×14): qty 1

## 2019-01-30 MED ORDER — SODIUM CHLORIDE 0.9 % IV SOLN: INTRAVENOUS | Status: DC

## 2019-01-30 MED ORDER — ENOXAPARIN SODIUM 40 MG/0.4ML ~~LOC~~ SOLN
40.0000 mg | SUBCUTANEOUS | Status: DC
  Administered 2019-01-30 – 2019-03-06 (×36): 40 mg via SUBCUTANEOUS
  Filled 2019-01-30 (×37): qty 0.4

## 2019-01-30 MED ORDER — MIDAZOLAM HCL 2 MG/2ML IJ SOLN
5.0000 mg | Freq: Once | INTRAMUSCULAR | Status: AC
  Administered 2019-01-30: 2 mg via INTRAVENOUS
  Filled 2019-01-30: qty 6

## 2019-01-30 MED ORDER — FENTANYL CITRATE (PF) 100 MCG/2ML IJ SOLN
100.0000 ug | Freq: Once | INTRAMUSCULAR | Status: AC
  Administered 2019-01-30: 13:00:00 50 ug via INTRAVENOUS
  Filled 2019-01-30: qty 2

## 2019-01-30 MED ORDER — METOPROLOL TARTRATE 5 MG/5ML IV SOLN
5.0000 mg | Freq: Once | INTRAVENOUS | Status: DC | PRN
  Filled 2019-01-30: qty 5

## 2019-01-30 MED ORDER — POTASSIUM CHLORIDE 20 MEQ PO PACK
20.0000 meq | PACK | Freq: Once | ORAL | Status: AC
  Administered 2019-01-30: 20 meq
  Filled 2019-01-30: qty 1

## 2019-01-30 NOTE — CV Procedure (Signed)
   Transesophageal Echocardiogram  Indications: Bacteremia  Time out performed  During this procedure the patient is administered a total of Versed 2 mg and Fentanyl 50 mcg to achieve and maintain moderate conscious sedation.  The patient's heart rate, blood pressure, and oxygen saturation are monitored continuously during the procedure. The period of conscious sedation is 20 minutes, of which I was present face-to-face 100% of this time.  Findings:  Left Ventricle: Normal EF  Mitral Valve: Mildly thickened leaflets with small posterior valve vegetation noted. Mild mitral regurgitation.  Aortic Valve: Normal valve  Tricuspid Valve: Normal  Left Atrium: No LAA thrombus  IMPRESSION: Native Valve Endocarditis.   Small Vegetation noted, mitral valve with only mild MR. Discussed with primary team.   Donato Schultz, MD

## 2019-01-30 NOTE — Progress Notes (Signed)
  Echocardiogram Echocardiogram Transesophageal has been performed.  Janalyn Harder 01/30/2019, 1:46 PM
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
## 2019-01-30 NOTE — Progress Notes (Signed)
Upon completion of bedside TEE. Wasted 50 mcg of fentanyl and an additional 2 mg of versed in the sharps container. Witnessed by Dr. Anne Fu Cardiology. Returned an additional 2 mg to the pyxis.
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
## 2019-01-30 NOTE — Progress Notes (Signed)
[facility]   Reason for visit: Follow up on MSSA bacteremia  Interval History: Tmax 100.  Remains intubated.  No acute events.    Physical Exam: Constitutional:  Vitals:   01/30/19 0753 01/30/19 0828  BP:    Pulse:    Resp:    Temp: 99.7 F (37.6 C)   SpO2:  98%   patient appears in NAD, alert Eyes: anicteric HENT: +ET Respiratory: respiratory effort on vent Cardiovascular: RRR  Review of Systems: Unable to be assessed due to patient factors  Lab Results  Component Value Date   WBC 7.2 01/30/2019   HGB 9.1 (L) 01/30/2019   HCT 29.8 (L) 01/30/2019   MCV 84.2 01/30/2019   PLT 104 (L) 01/30/2019    Lab Results  Component Value Date   CREATININE 0.84 01/30/2019   BUN 24 (H) 01/30/2019   NA 150 (H) 01/30/2019   K 3.6 01/30/2019   CL 113 (H) 01/30/2019   CO2 27 01/30/2019    Lab Results  Component Value Date   ALT 46 (H) 01/22/2019   AST 57 (H) 01/22/2019   ALKPHOS 83 01/22/2019     Microbiology: Recent Results (from the past 240 hour(s))  Respiratory Panel by RT PCR (Flu A&B, Covid) - Nasopharyngeal Swab     Status: None   Collection Time: 01/21/19  8:50 PM   Specimen: Nasopharyngeal Swab  Result Value Ref Range Status   SARS Coronavirus 2 by RT PCR NEGATIVE NEGATIVE Final    Comment: (NOTE) SARS-CoV-2 target nucleic acids are NOT DETECTED. The SARS-CoV-2 RNA is generally detectable in upper respiratoy specimens during the acute phase of infection. The lowest concentration of SARS-CoV-2 viral copies this assay can detect is 131 copies/mL. A negative result does not preclude SARS-Cov-2 infection and should not be used as the sole basis for treatment or other patient management decisions. A negative result may occur with  improper specimen collection/handling, submission of specimen other than nasopharyngeal swab, presence of viral mutation(s) within the areas targeted by this assay, and inadequate number of viral  copies (<131 copies/mL). A negative result must be combined with clinical observations, patient history, and epidemiological information. The expected result is Negative. Fact Sheet for Patients:  https://www.moore.com/ Fact Sheet for Healthcare Providers:  https://www.young.biz/ This test is not yet ap proved or cleared by the [place] FDA and  has been authorized for detection and/or diagnosis of SARS-CoV-2 by FDA under an Emergency Use Authorization (EUA). This EUA will remain  in effect (meaning this test can be used) for the duration of the COVID-19 declaration under Section 564(b)(1) of the Act, 21 U.S.C. section 360bbb-3(b)(1), unless the authorization is terminated or revoked sooner.    Influenza A by PCR NEGATIVE NEGATIVE Final   Influenza B by PCR NEGATIVE NEGATIVE Final    Comment: (NOTE) The Xpert Xpress SARS-CoV-2/FLU/RSV assay is intended as an aid in  the diagnosis of influenza from Nasopharyngeal swab specimens and  should not be used as a sole basis for treatment. Nasal washings and  aspirates are unacceptable for Xpert Xpress SARS-CoV-2/FLU/RSV  testing. Fact Sheet for Patients: https://www.moore.com/ Fact Sheet for Healthcare Providers: https://www.young.biz/ This test is not yet approved or cleared by the [place] FDA and  has been authorized for detection and/or diagnosis of SARS-CoV-2 by  FDA under an Emergency Use Authorization (EUA). This EUA will remain  in effect (meaning this test can be used) for the duration of the  Covid-19 declaration under  Section 564(b)(1) of the Act, 21  U.S.C. section 360bbb-3(b)(1), unless the authorization is  terminated or revoked. Performed at [facility], [address], [place], [place] [address]   Urine culture     Status: Abnormal   Collection Time: 01/21/19  9:30 PM   Specimen: Urine, Catheterized  Result Value Ref Range  Status   Specimen Description URINE, CATHETERIZED  Final   Special Requests   Final    NONE Performed at [facility], [address] [place], [place] [address]    Culture MULTIPLE SPECIES PRESENT, SUGGEST RECOLLECTION (A)  Final   Report Status 01/22/2019 FINAL  Final  Blood culture (routine x 2)     Status: Abnormal   Collection Time: 01/21/19 10:00 PM   Specimen: BLOOD  Result Value Ref Range Status   Specimen Description BLOOD RIGHT ANTECUBITAL  Final   Special Requests   Final    BOTTLES DRAWN AEROBIC AND ANAEROBIC Blood Culture adequate volume   Culture  Setup Time   Final    GRAM POSITIVE COCCI IN CLUSTERS IN BOTH AEROBIC AND ANAEROBIC BOTTLES CRITICAL RESULT CALLED TO, READ BACK BY AND VERIFIED WITH: Enzo Montgomery FRENS [id number] FCP Performed at [facility], [address] [place], [place] [address]    Culture STAPHYLOCOCCUS AUREUS (A)  Final   Report Status 01/24/2019 FINAL  Final   Organism ID, Bacteria STAPHYLOCOCCUS AUREUS  Final      Susceptibility   Staphylococcus aureus - MIC*    CIPROFLOXACIN <=0.5 SENSITIVE Sensitive     ERYTHROMYCIN <=0.25 SENSITIVE Sensitive     GENTAMICIN <=0.5 SENSITIVE Sensitive     OXACILLIN <=0.25 SENSITIVE Sensitive     TETRACYCLINE <=1 SENSITIVE Sensitive     VANCOMYCIN 1 SENSITIVE Sensitive     TRIMETH/SULFA <=10 SENSITIVE Sensitive     CLINDAMYCIN <=0.25 SENSITIVE Sensitive     RIFAMPIN <=0.5 SENSITIVE Sensitive     Inducible Clindamycin NEGATIVE Sensitive     * STAPHYLOCOCCUS AUREUS  Respiratory Panel by RT PCR (Flu A&B, Covid) - Nasopharyngeal Swab     Status: None   Collection Time: 01/22/19  8:42 AM   Specimen: Nasopharyngeal Swab  Result Value Ref Range Status   SARS Coronavirus 2 by RT PCR NEGATIVE NEGATIVE Final    Comment: (NOTE) SARS-CoV-2 target nucleic acids are NOT DETECTED. The SARS-CoV-2 RNA is generally detectable in upper respiratoy specimens during the acute phase of infection. The  lowest concentration of SARS-CoV-2 viral copies this assay can detect is 131 copies/mL. A negative result does not preclude SARS-Cov-2 infection and should not be used as the sole basis for treatment or other patient management decisions. A negative result may occur with  improper specimen collection/handling, submission of specimen other than nasopharyngeal swab, presence of viral mutation(s) within the areas targeted by this assay, and inadequate number of viral copies (<131 copies/mL). A negative result must be combined with clinical observations, patient history, and epidemiological information. The expected result is Negative. Fact Sheet for Patients:  https://www.moore.com/ Fact Sheet for Healthcare Providers:  https://www.young.biz/ This test is not yet ap proved or cleared by the [place] FDA and  has been authorized for detection and/or diagnosis of SARS-CoV-2 by FDA under an Emergency Use Authorization (EUA). This EUA will remain  in effect (meaning this test can be used) for the duration of the COVID-19 declaration under Section 564(b)(1) of the Act, 21 U.S.C. section 360bbb-3(b)(1), unless the authorization is terminated or revoked sooner.  Influenza A by PCR NEGATIVE NEGATIVE Final   Influenza B by PCR NEGATIVE NEGATIVE Final    Comment: (NOTE) The Xpert Xpress SARS-CoV-2/FLU/RSV assay is intended as an aid in  the diagnosis of influenza from Nasopharyngeal swab specimens and  should not be used as a sole basis for treatment. Nasal washings and  aspirates are unacceptable for Xpert Xpress SARS-CoV-2/FLU/RSV  testing. Fact Sheet for Patients: PinkCheek.be Fact Sheet for Healthcare Providers: GravelBags.it This test is not yet approved or cleared by the [place] FDA and  has been authorized for detection and/or diagnosis of SARS-CoV-2 by  FDA under an Emergency  Use Authorization (EUA). This EUA will remain  in effect (meaning this test can be used) for the duration of the  Covid-19 declaration under Section 564(b)(1) of the Act, 21  U.S.C. section 360bbb-3(b)(1), unless the authorization is  terminated or revoked. Performed at [facility], [place] [address], [place], [place] [address]   MRSA PCR Screening     Status: None   Collection Time: 01/22/19  9:32 AM   Specimen: Nasopharyngeal  Result Value Ref Range Status   MRSA by PCR NEGATIVE NEGATIVE Final    Comment:        The GeneXpert MRSA Assay (FDA approved for NASAL specimens only), is one component of a comprehensive MRSA colonization surveillance program. It is not intended to diagnose MRSA infection nor to guide or monitor treatment for MRSA infections. Performed at [facility], [place] [address], [place], [place] [address]   Blood culture (routine x 2)     Status: None   Collection Time: 01/22/19 10:21 AM   Specimen: BLOOD RIGHT HAND  Result Value Ref Range Status   Specimen Description BLOOD RIGHT HAND  Final   Special Requests   Final    BOTTLES DRAWN AEROBIC ONLY Blood Culture results may not be optimal due to an inadequate volume of blood received in culture bottles   Culture   Final    NO GROWTH 5 DAYS Performed at [facility], [place] [address], [place], [place] [address]    Report Status 01/27/2019 FINAL  Final  Culture, respiratory (non-expectorated)     Status: None (Preliminary result)   Collection Time: 01/24/19  8:51 AM   Specimen: Tracheal Aspirate; Respiratory  Result Value Ref Range Status   Specimen Description TRACHEAL ASPIRATE  Final   Special Requests NONE  Final   Gram Stain   Final    MODERATE WBC PRESENT, PREDOMINANTLY PMN RARE GRAM POSITIVE COCCI IN PAIRS RARE GRAM NEGATIVE RODS    Culture   Final    CULTURE REINCUBATED FOR BETTER GROWTH Performed at [facility], [place] [address], [place], [place] [address]    Report Status  PENDING  Incomplete    Impression/Plan:  1. MSSA bacteremia - will change back to cefazolin with no significant CNS findings.  Duration will depend on the TEE.     2.  Thrombocytopenia -  Platelets up to 104 now.  Likely from #1 and improving.    3. Hypernatremia - will change antibiotics as in #1 to cefazollin to reduce the sodium load from nafcillin.

## 2019-01-31 ENCOUNTER — Inpatient Hospital Stay (HOSPITAL_COMMUNITY): Payer: Medicare Other

## 2019-01-31 DIAGNOSIS — F3160 Bipolar disorder, current episode mixed, unspecified: Secondary | ICD-10-CM

## 2019-01-31 LAB — BASIC METABOLIC PANEL
Anion gap: 10 (ref 5–15)
BUN: 17 mg/dL (ref 8–23)
CO2: 26 mmol/L (ref 22–32)
Calcium: 7.9 mg/dL — ABNORMAL LOW (ref 8.9–10.3)
Chloride: 109 mmol/L (ref 98–111)
Creatinine, Ser: 0.86 mg/dL (ref 0.61–1.24)
GFR calc Af Amer: >60 mL/min (ref >60)
GFR calc non Af Amer: >60 mL/min (ref >60)
Glucose, Bld: 145 mg/dL — ABNORMAL HIGH (ref 70–99)
Potassium: 3.4 mmol/L — ABNORMAL LOW (ref 3.5–5.1)
Sodium: 145 mmol/L (ref 135–145)

## 2019-01-31 LAB — CBC
HCT: 29.7 % — ABNORMAL LOW (ref 39.0–52.0)
Hemoglobin: 9 g/dL — ABNORMAL LOW (ref 13.0–17.0)
MCH: 25.6 pg — ABNORMAL LOW (ref 26.0–34.0)
MCHC: 30.3 g/dL (ref 30.0–36.0)
MCV: 84.6 fL (ref 80.0–100.0)
Platelets: 175 10*3/uL (ref 150–400)
RBC: 3.51 MIL/uL — ABNORMAL LOW (ref 4.22–5.81)
RDW: 16.6 % — ABNORMAL HIGH (ref 11.5–15.5)
WBC: 6.5 10*3/uL (ref 4.0–10.5)
nRBC: 0 % (ref 0.0–0.2)

## 2019-01-31 LAB — GLUCOSE, CAPILLARY
Glucose-Capillary: 107 mg/dL — ABNORMAL HIGH (ref 70–99)
Glucose-Capillary: 108 mg/dL — ABNORMAL HIGH (ref 70–99)
Glucose-Capillary: 108 mg/dL — ABNORMAL HIGH (ref 70–99)
Glucose-Capillary: 121 mg/dL — ABNORMAL HIGH (ref 70–99)
Glucose-Capillary: 146 mg/dL — ABNORMAL HIGH (ref 70–99)
Glucose-Capillary: 87 mg/dL (ref 70–99)

## 2019-01-31 MED ORDER — POTASSIUM CHLORIDE 20 MEQ/15ML (10%) PO SOLN: 40.0000 meq | Freq: Two times a day (BID) | ORAL | Status: DC

## 2019-01-31 MED ORDER — POLYETHYLENE GLYCOL 3350 17 G PO PACK
17.0000 g | PACK | Freq: Every day | ORAL | Status: DC | PRN
  Administered 2019-02-02: 17 g
  Filled 2019-01-31: qty 1

## 2019-01-31 MED ORDER — POTASSIUM CHLORIDE 20 MEQ/15ML (10%) PO SOLN
40.0000 meq | Freq: Two times a day (BID) | ORAL | Status: AC
  Administered 2019-01-31 (×2): 40 meq
  Filled 2019-01-31 (×2): qty 30

## 2019-01-31 MED ORDER — POLYETHYLENE GLYCOL 3350 17 G PO PACK: 17.0000 g | PACK | Freq: Every day | ORAL | Status: DC
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
## 2019-01-31 NOTE — Progress Notes (Signed)
[facility]   Reason for visit: Follow up on MSSA bacteremia  Interval History: Remains afebrile.  No acute events.  Remains intubated.  No associated diarrhea.  Na improved after stopping nafcillin.     Physical Exam: Constitutional:  Vitals:   01/31/19 0900 01/31/19 1000  BP: 119/67 125/72  Pulse: 76 87  Resp: 16 14  Temp: 99.1 F (37.3 C) 99.3 F (37.4 C)  SpO2: 97% 96%   patient appears in NAD, alert Eyes: anicteric HENT: +ET Respiratory: respiratory effort on vent Cardiovascular: RRR  Review of Systems: Unable to be assessed due to patient factors  Lab Results  Component Value Date   WBC 6.5 01/31/2019   HGB 9.0 (L) 01/31/2019   HCT 29.7 (L) 01/31/2019   MCV 84.6 01/31/2019   PLT 175 01/31/2019    Lab Results  Component Value Date   CREATININE 0.86 01/31/2019   BUN 17 01/31/2019   NA 145 01/31/2019   K 3.4 (L) 01/31/2019   CL 109 01/31/2019   CO2 26 01/31/2019    Lab Results  Component Value Date   ALT 46 (H) 01/22/2019   AST 57 (H) 01/22/2019   ALKPHOS 83 01/22/2019     Microbiology: Recent Results (from the past 240 hour(s))  Respiratory Panel by RT PCR (Flu A&B, Covid) - Nasopharyngeal Swab     Status: None   Collection Time: 01/21/19  8:50 PM   Specimen: Nasopharyngeal Swab  Result Value Ref Range Status   SARS Coronavirus 2 by RT PCR NEGATIVE NEGATIVE Final    Comment: (NOTE) SARS-CoV-2 target nucleic acids are NOT DETECTED. The SARS-CoV-2 RNA is generally detectable in upper respiratoy specimens during the acute phase of infection. The lowest concentration of SARS-CoV-2 viral copies this assay can detect is 131 copies/mL. A negative result does not preclude SARS-Cov-2 infection and should not be used as the sole basis for treatment or other patient management decisions. A negative result may occur with  improper specimen collection/handling, submission of specimen other than nasopharyngeal swab, presence of  viral mutation(s) within the areas targeted by this assay, and inadequate number of viral copies (<131 copies/mL). A negative result must be combined with clinical observations, patient history, and epidemiological information. The expected result is Negative. Fact Sheet for Patients:  https://www.moore.com/ Fact Sheet for Healthcare Providers:  https://www.young.biz/ This test is not yet ap proved or cleared by the [place] FDA and  has been authorized for detection and/or diagnosis of SARS-CoV-2 by FDA under an Emergency Use Authorization (EUA). This EUA will remain  in effect (meaning this test can be used) for the duration of the COVID-19 declaration under Section 564(b)(1) of the Act, 21 U.S.C. section 360bbb-3(b)(1), unless the authorization is terminated or revoked sooner.    Influenza A by PCR NEGATIVE NEGATIVE Final   Influenza B by PCR NEGATIVE NEGATIVE Final    Comment: (NOTE) The Xpert Xpress SARS-CoV-2/FLU/RSV assay is intended as an aid in  the diagnosis of influenza from Nasopharyngeal swab specimens and  should not be used as a sole basis for treatment. Nasal washings and  aspirates are unacceptable for Xpert Xpress SARS-CoV-2/FLU/RSV  testing. Fact Sheet for Patients: https://www.moore.com/ Fact Sheet for Healthcare Providers: https://www.young.biz/ This test is not yet approved or cleared by the [place] FDA and  has been authorized for detection and/or diagnosis of SARS-CoV-2 by  FDA under an Emergency Use Authorization (EUA). This EUA will remain  in effect (meaning this test can  be used) for the duration of the  Covid-19 declaration under Section 564(b)(1) of the Act, 21  U.S.C. section 360bbb-3(b)(1), unless the authorization is  terminated or revoked. Performed at [facility], [place] [address], [place], [place] [address]   Urine culture     Status: Abnormal    Collection Time: 01/21/19  9:30 PM   Specimen: Urine, Catheterized  Result Value Ref Range Status   Specimen Description URINE, CATHETERIZED  Final   Special Requests   Final    NONE Performed at [facility], [place] [address], [place], [place] [address]    Culture MULTIPLE SPECIES PRESENT, SUGGEST RECOLLECTION (A)  Final   Report Status 01/22/2019 FINAL  Final  Blood culture (routine x 2)     Status: Abnormal   Collection Time: 01/21/19 10:00 PM   Specimen: BLOOD  Result Value Ref Range Status   Specimen Description BLOOD RIGHT ANTECUBITAL  Final   Special Requests   Final    BOTTLES DRAWN AEROBIC AND ANAEROBIC Blood Culture adequate volume   Culture  Setup Time   Final    GRAM POSITIVE COCCI IN CLUSTERS IN BOTH AEROBIC AND ANAEROBIC BOTTLES CRITICAL RESULT CALLED TO, READ BACK BY AND VERIFIED WITH: Clydene Fake FRENS [phone] FCP Performed at [facility], [place] [address] [place], [place] [address]    Culture STAPHYLOCOCCUS AUREUS (A)  Final   Report Status 01/24/2019 FINAL  Final   Organism ID, Bacteria STAPHYLOCOCCUS AUREUS  Final      Susceptibility   Staphylococcus aureus - MIC*    CIPROFLOXACIN <=0.5 SENSITIVE Sensitive     ERYTHROMYCIN <=0.25 SENSITIVE Sensitive     GENTAMICIN <=0.5 SENSITIVE Sensitive     OXACILLIN <=0.25 SENSITIVE Sensitive     TETRACYCLINE <=1 SENSITIVE Sensitive     VANCOMYCIN 1 SENSITIVE Sensitive     TRIMETH/SULFA <=10 SENSITIVE Sensitive     CLINDAMYCIN <=0.25 SENSITIVE Sensitive     RIFAMPIN <=0.5 SENSITIVE Sensitive     Inducible Clindamycin NEGATIVE Sensitive     * STAPHYLOCOCCUS AUREUS  Respiratory Panel by RT PCR (Flu A&B, Covid) - Nasopharyngeal Swab     Status: None   Collection Time: 01/22/19  8:42 AM   Specimen: Nasopharyngeal Swab  Result Value Ref Range Status   SARS Coronavirus 2 by RT PCR NEGATIVE NEGATIVE Final    Comment: (NOTE) SARS-CoV-2 target nucleic acids are NOT DETECTED. The SARS-CoV-2 RNA is generally  detectable in upper respiratoy specimens during the acute phase of infection. The lowest concentration of SARS-CoV-2 viral copies this assay can detect is 131 copies/mL. A negative result does not preclude SARS-Cov-2 infection and should not be used as the sole basis for treatment or other patient management decisions. A negative result may occur with  improper specimen collection/handling, submission of specimen other than nasopharyngeal swab, presence of viral mutation(s) within the areas targeted by this assay, and inadequate number of viral copies (<131 copies/mL). A negative result must be combined with clinical observations, patient history, and epidemiological information. The expected result is Negative. Fact Sheet for Patients:  PinkCheek.be Fact Sheet for Healthcare Providers:  GravelBags.it This test is not yet ap proved or cleared by the [place] FDA and  has been authorized for detection and/or diagnosis of SARS-CoV-2 by FDA under an Emergency Use Authorization (EUA). This EUA will remain  in effect (meaning this test can be used) for the duration of the COVID-19 declaration under Section 564(b)(1) of the Act, 21  U.S.C. section 360bbb-3(b)(1), unless the authorization is terminated or revoked sooner.    Influenza A by PCR NEGATIVE NEGATIVE Final   Influenza B by PCR NEGATIVE NEGATIVE Final    Comment: (NOTE) The Xpert Xpress SARS-CoV-2/FLU/RSV assay is intended as an aid in  the diagnosis of influenza from Nasopharyngeal swab specimens and  should not be used as a sole basis for treatment. Nasal washings and  aspirates are unacceptable for Xpert Xpress SARS-CoV-2/FLU/RSV  testing. Fact Sheet for Patients: https://www.moore.com/ Fact Sheet for Healthcare Providers: https://www.young.biz/ This test is not yet approved or cleared by the [place] FDA and  has been  authorized for detection and/or diagnosis of SARS-CoV-2 by  FDA under an Emergency Use Authorization (EUA). This EUA will remain  in effect (meaning this test can be used) for the duration of the  Covid-19 declaration under Section 564(b)(1) of the Act, 21  U.S.C. section 360bbb-3(b)(1), unless the authorization is  terminated or revoked. Performed at [facility], [address] [place], [place] [address]   MRSA PCR Screening     Status: None   Collection Time: 01/22/19  9:32 AM   Specimen: Nasopharyngeal  Result Value Ref Range Status   MRSA by PCR NEGATIVE NEGATIVE Final    Comment:        The GeneXpert MRSA Assay (FDA approved for NASAL specimens only), is one component of a comprehensive MRSA colonization surveillance program. It is not intended to diagnose MRSA infection nor to guide or monitor treatment for MRSA infections. Performed at [facility], [address] [place], [place] [address]   Blood culture (routine x 2)     Status: None   Collection Time: 01/22/19 10:21 AM   Specimen: BLOOD RIGHT HAND  Result Value Ref Range Status   Specimen Description BLOOD RIGHT HAND  Final   Special Requests   Final    BOTTLES DRAWN AEROBIC ONLY Blood Culture results may not be optimal due to an inadequate volume of blood received in culture bottles   Culture   Final    NO GROWTH 5 DAYS Performed at [facility], [address] [place], [place] [address]    Report Status 01/27/2019 FINAL  Final  Culture, respiratory (non-expectorated)     Status: None (Preliminary result)   Collection Time: 01/24/19  8:51 AM   Specimen: Tracheal Aspirate; Respiratory  Result Value Ref Range Status   Specimen Description TRACHEAL ASPIRATE  Final   Special Requests NONE  Final   Gram Stain   Final    MODERATE WBC PRESENT, PREDOMINANTLY PMN RARE GRAM POSITIVE COCCI IN PAIRS RARE GRAM NEGATIVE RODS    Culture   Final    CULTURE REINCUBATED FOR BETTER GROWTH Performed at  [facility], [address] [place], [place] [address]    Report Status PENDING  Incomplete    Impression/Plan:  1. MSSA bacteremia - will change back to cefazolin with no significant CNS findings. TEE with small vegetation on mitral valve.   He will need to continue with cefazolin through February 26th.    2.  Thrombocytopenia -  Platelets now wnl, likely was from #1.    3. Hypernatremia - improved now off of nafcillin.  Na now wnl.    4.  Respiratory failure - remains intubated.    At this time, I will sign off as he likely will eventually need SNF as he would be a good candidate for home IV antibiotics.

## 2019-02-01 ENCOUNTER — Inpatient Hospital Stay (HOSPITAL_COMMUNITY): Payer: Medicare Other

## 2019-02-01 DIAGNOSIS — R4182 Altered mental status, unspecified: Secondary | ICD-10-CM | POA: Diagnosis present

## 2019-02-01 LAB — BASIC METABOLIC PANEL
Anion gap: 8 (ref 5–15)
BUN: 18 mg/dL (ref 8–23)
CO2: 27 mmol/L (ref 22–32)
Calcium: 8.2 mg/dL — ABNORMAL LOW (ref 8.9–10.3)
Chloride: 107 mmol/L (ref 98–111)
Creatinine, Ser: 0.65 mg/dL (ref 0.61–1.24)
GFR calc Af Amer: >60 mL/min (ref >60)
GFR calc non Af Amer: >60 mL/min (ref >60)
Glucose, Bld: 138 mg/dL — ABNORMAL HIGH (ref 70–99)
Potassium: 3.9 mmol/L (ref 3.5–5.1)
Sodium: 142 mmol/L (ref 135–145)

## 2019-02-01 LAB — GLUCOSE, CAPILLARY
Glucose-Capillary: 131 mg/dL — ABNORMAL HIGH (ref 70–99)
Glucose-Capillary: 112 mg/dL — ABNORMAL HIGH (ref 70–99)
Glucose-Capillary: 119 mg/dL — ABNORMAL HIGH (ref 70–99)
Glucose-Capillary: 125 mg/dL — ABNORMAL HIGH (ref 70–99)
Glucose-Capillary: 120 mg/dL — ABNORMAL HIGH (ref 70–99)

## 2019-02-01 LAB — APTT: aPTT: 28 s (ref 24–36)

## 2019-02-01 LAB — PROTIME-INR
INR: 1.1 (ref 0.8–1.2)
Prothrombin Time: 13.8 s (ref 11.4–15.2)

## 2019-02-01 LAB — CBC
HCT: 30.6 % — ABNORMAL LOW (ref 39.0–52.0)
Hemoglobin: 9.4 g/dL — ABNORMAL LOW (ref 13.0–17.0)
MCH: 25.8 pg — ABNORMAL LOW (ref 26.0–34.0)
MCHC: 30.7 g/dL (ref 30.0–36.0)
MCV: 84.1 fL (ref 80.0–100.0)
Platelets: 240 10*3/uL (ref 150–400)
RBC: 3.64 MIL/uL — ABNORMAL LOW (ref 4.22–5.81)
RDW: 16.3 % — ABNORMAL HIGH (ref 11.5–15.5)
WBC: 6.6 10*3/uL (ref 4.0–10.5)
nRBC: 0 % (ref 0.0–0.2)

## 2019-02-01 MED ORDER — MODAFINIL 200 MG PO TABS: 100.0000 mg | ORAL_TABLET | Freq: Every day | ORAL | Status: DC

## 2019-02-01 MED ORDER — VECURONIUM BROMIDE 10 MG IV SOLR
10.0000 mg | Freq: Once | INTRAVENOUS | Status: AC
  Administered 2019-02-01: 10 mg via INTRAVENOUS
  Filled 2019-02-01: qty 10

## 2019-02-01 MED ORDER — VECURONIUM BROMIDE 10 MG IV SOLR: 10.0000 mg | Freq: Once | INTRAVENOUS | Status: DC

## 2019-02-01 MED ORDER — SCOPOLAMINE 1 MG/3DAYS TD PT72
1.0000 | MEDICATED_PATCH | TRANSDERMAL | Status: DC
  Administered 2019-02-01: 1.5 mg via TRANSDERMAL
  Filled 2019-02-01: qty 1

## 2019-02-01 MED ORDER — ETOMIDATE 2 MG/ML IV SOLN
20.0000 mg | Freq: Once | INTRAVENOUS | Status: AC
  Administered 2019-02-01: 20 mg via INTRAVENOUS

## 2019-02-01 MED ORDER — FENTANYL CITRATE (PF) 100 MCG/2ML IJ SOLN
200.0000 ug | Freq: Once | INTRAMUSCULAR | Status: DC
  Filled 2019-02-01 (×2): qty 4

## 2019-02-01 MED ORDER — MIDAZOLAM HCL 2 MG/2ML IJ SOLN
5.0000 mg | Freq: Once | INTRAMUSCULAR | Status: DC
  Filled 2019-02-01 (×2): qty 6

## 2019-02-01 MED ORDER — MIDAZOLAM HCL 2 MG/2ML IJ SOLN
2.0000 mg | Freq: Once | INTRAMUSCULAR | Status: AC
  Administered 2019-02-01: 2 mg via INTRAVENOUS

## 2019-02-01 MED ORDER — TRIAMCINOLONE 0.1 % CREAM:EUCERIN CREAM 1:1
TOPICAL_CREAM | Freq: Two times a day (BID) | CUTANEOUS | Status: DC
  Administered 2019-02-01 – 2019-04-06 (×20): 1 via TOPICAL
  Filled 2019-02-01 (×14): qty 1

## 2019-02-01 MED ORDER — MODAFINIL 200 MG PO TABS
100.0000 mg | ORAL_TABLET | Freq: Every day | ORAL | Status: DC
  Administered 2019-02-01: 100 mg via ORAL
  Filled 2019-02-01: qty 1

## 2019-02-01 MED ORDER — VECURONIUM BROMIDE 10 MG IV SOLR
INTRAVENOUS | Status: AC
  Filled 2019-02-01: qty 10

## 2019-02-01 MED ORDER — FENTANYL CITRATE (PF) 100 MCG/2ML IJ SOLN
100.0000 ug | Freq: Once | INTRAMUSCULAR | Status: AC
  Administered 2019-02-01: 15:00:00 100 ug via INTRAVENOUS

## 2019-02-01 MED ORDER — ETOMIDATE 2 MG/ML IV SOLN
40.0000 mg | Freq: Once | INTRAVENOUS | Status: DC
  Filled 2019-02-01: qty 20

## 2019-02-01 NOTE — Procedures (Signed)
Bronchoscopy  Indication: Assistance with placement of trach  Consent: Signed in chart  Anesthesia: See Dr. Percival Spanish note  Procedure - Timeout performed - Bronchoscope advanced through ETT - Airways examined down to subsegmental level - Following airway examination, therapeutic suctioning performed at R mainstem then direct visualization of tracheostomy performed  Findings - Mild-moderate thick white secretions - Appropriate position of tracheostomy above carina  Specimen(s): None  Complications: None

## 2019-02-01 NOTE — Procedures (Signed)
Percutaneous Tracheostomy Placement  Consent from family.  Patient sedated, paralyzed and position.  Placed on 100% FiO2 and RR matched.  Area cleaned and draped.  Lidocaine/epi injected.  Skin incision done followed by blunt dissection.  Trachea palpated then punctured, catheter passed and visualized bronchoscopically.  Wire placed and visualized.  Catheter removed.  Airway then entered and dilated.  Size 6 cuffed shiley trach placed and visualized bronchoscopically well above carina.  Good volume returns.  Patient tolerated the procedure well without complications.  Minimal blood loss.  CXR ordered and pending.  Sherryl Valido G. Lorrine Killilea, M.D. [facility]. Pager: [phone]. After hours pager: [phone].  
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
## 2019-02-02 LAB — GLUCOSE, CAPILLARY
Glucose-Capillary: 107 mg/dL — ABNORMAL HIGH (ref 70–99)
Glucose-Capillary: 114 mg/dL — ABNORMAL HIGH (ref 70–99)
Glucose-Capillary: 131 mg/dL — ABNORMAL HIGH (ref 70–99)
Glucose-Capillary: 133 mg/dL — ABNORMAL HIGH (ref 70–99)
Glucose-Capillary: 142 mg/dL — ABNORMAL HIGH (ref 70–99)
Glucose-Capillary: 147 mg/dL — ABNORMAL HIGH (ref 70–99)

## 2019-02-02 LAB — BASIC METABOLIC PANEL WITH GFR
Anion gap: 8 (ref 5–15)
BUN: 19 mg/dL (ref 8–23)
CO2: 29 mmol/L (ref 22–32)
Calcium: 8.4 mg/dL — ABNORMAL LOW (ref 8.9–10.3)
Chloride: 105 mmol/L (ref 98–111)
Creatinine, Ser: 0.54 mg/dL — ABNORMAL LOW (ref 0.61–1.24)
GFR calc Af Amer: >60 mL/min
GFR calc non Af Amer: >60 mL/min
Glucose, Bld: 148 mg/dL — ABNORMAL HIGH (ref 70–99)
Potassium: 4 mmol/L (ref 3.5–5.1)
Sodium: 142 mmol/L (ref 135–145)

## 2019-02-02 LAB — CBC
HCT: 32.4 % — ABNORMAL LOW (ref 39.0–52.0)
Hemoglobin: 9.6 g/dL — ABNORMAL LOW (ref 13.0–17.0)
MCH: 25.7 pg — ABNORMAL LOW (ref 26.0–34.0)
MCHC: 29.6 g/dL — ABNORMAL LOW (ref 30.0–36.0)
MCV: 86.6 fL (ref 80.0–100.0)
Platelets: 280 K/uL (ref 150–400)
RBC: 3.74 MIL/uL — ABNORMAL LOW (ref 4.22–5.81)
RDW: 16.7 % — ABNORMAL HIGH (ref 11.5–15.5)
WBC: 10.3 K/uL (ref 4.0–10.5)
nRBC: 0 % (ref 0.0–0.2)

## 2019-02-02 LAB — TRIGLYCERIDES: Triglycerides: 91 mg/dL (ref <150)

## 2019-02-02 MED ORDER — ACETAZOLAMIDE SODIUM 500 MG IJ SOLR: 500.0000 mg | Freq: Once | INTRAMUSCULAR | Status: DC

## 2019-02-02 MED ORDER — DEXTROSE 5 % IV SOLN
500.0000 mg | Freq: Once | INTRAVENOUS | Status: AC
  Administered 2019-02-02: 500 mg via INTRAVENOUS
  Filled 2019-02-02: qty 500

## 2019-02-02 MED ORDER — FENTANYL CITRATE (PF) 100 MCG/2ML IJ SOLN
25.0000 ug | INTRAMUSCULAR | Status: DC | PRN
  Administered 2019-02-11 (×3): 100 ug via INTRAVENOUS
  Administered 2019-02-12 (×2): 25 ug via INTRAVENOUS
  Administered 2019-02-13: 50 ug via INTRAVENOUS
  Administered 2019-02-13 – 2019-02-20 (×3): 100 ug via INTRAVENOUS
  Administered 2019-02-22: 50 ug via INTRAVENOUS
  Administered 2019-02-22: 100 ug via INTRAVENOUS
  Administered 2019-02-24 (×2): 50 ug via INTRAVENOUS
  Administered 2019-02-25 – 2019-03-01 (×10): 100 ug via INTRAVENOUS
  Filled 2019-02-02 (×23): qty 2

## 2019-02-02 MED ORDER — ACETAZOLAMIDE 250 MG PO TABS
250.0000 mg | ORAL_TABLET | Freq: Two times a day (BID) | ORAL | Status: DC
  Administered 2019-02-02 – 2019-02-03 (×3): 250 mg via ORAL
  Filled 2019-02-02 (×3): qty 1

## 2019-02-02 MED ORDER — HALOPERIDOL LACTATE 5 MG/ML IJ SOLN
2.0000 mg | Freq: Once | INTRAMUSCULAR | Status: AC
  Administered 2019-02-02: 01:00:00 2 mg via INTRAVENOUS

## 2019-02-02 MED ORDER — HALOPERIDOL LACTATE 5 MG/ML IJ SOLN
2.0000 mg | Freq: Once | INTRAMUSCULAR | Status: AC
  Administered 2019-02-02: 2 mg via INTRAVENOUS
  Filled 2019-02-02: qty 1

## 2019-02-02 MED ORDER — GLYCOPYRROLATE 1 MG PO TABS
1.0000 mg | ORAL_TABLET | Freq: Two times a day (BID) | ORAL | Status: DC | PRN
  Administered 2019-02-02: 1 mg
  Administered 2019-02-02: 2 mg
  Administered 2019-02-03: 1 mg
  Administered 2019-02-03 – 2019-02-05 (×2): 2 mg
  Filled 2019-02-02 (×3): qty 2
  Filled 2019-02-02 (×2): qty 1

## 2019-02-02 MED ORDER — FUROSEMIDE 10 MG/ML IJ SOLN
40.0000 mg | Freq: Once | INTRAMUSCULAR | Status: AC
  Administered 2019-02-02: 40 mg via INTRAVENOUS
  Filled 2019-02-02: qty 4

## 2019-02-02 MED ORDER — HALOPERIDOL LACTATE 5 MG/ML IJ SOLN
INTRAMUSCULAR | Status: AC
  Filled 2019-02-02: qty 1

## 2019-02-02 MED ORDER — MODAFINIL 100 MG PO TABS
100.0000 mg | ORAL_TABLET | Freq: Every day | ORAL | Status: DC
  Administered 2019-02-02 – 2019-02-06 (×5): 100 mg
  Filled 2019-02-02 (×5): qty 1
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
## 2019-02-02 NOTE — Progress Notes (Signed)
eLink Physician-Brief Progress Note Patient Name: Marc [name] DOB: 08/11/1955 MRN: [id number]   Date of Service  02/02/2019  HPI/Events of Note  Agitation - QTc interval = 0.45 seconds.  eICU Interventions  Will order: 1. Haldol 2 mg IV X 1 now.      Intervention Category Major Interventions: Delirium, psychosis, severe agitation - evaluation and management  Ghazal Pevey Eugene 02/02/2019, 4:25 AM

## 2019-02-02 NOTE — Progress Notes (Signed)
eLink Physician-Brief Progress Note Patient Name: Marc [name] DOB: [birth year] MRN: [id number]   Date of Service  02/02/2019  HPI/Events of Note  Agitation - Patient trying to get OOB. QTc interval = 0.44 seconds.   eICU Interventions  Will order: 1. Haldol 2 mg IV X 1 now.      Intervention Category Major Interventions: Delirium, psychosis, severe agitation - evaluation and management  [name] 02/02/2019, 12:54 AM

## 2019-02-02 NOTE — Progress Notes (Signed)
Assisted tele visit to patient with family member.  Kaileia Flow M, RN  

## 2019-02-03 LAB — BASIC METABOLIC PANEL
Anion gap: 11 (ref 5–15)
BUN: 20 mg/dL (ref 8–23)
CO2: 26 mmol/L (ref 22–32)
Calcium: 8.2 mg/dL — ABNORMAL LOW (ref 8.9–10.3)
Chloride: 100 mmol/L (ref 98–111)
Creatinine, Ser: 0.72 mg/dL (ref 0.61–1.24)
GFR calc Af Amer: >60 mL/min (ref >60)
GFR calc non Af Amer: >60 mL/min (ref >60)
Glucose, Bld: 142 mg/dL — ABNORMAL HIGH (ref 70–99)
Potassium: 3.5 mmol/L (ref 3.5–5.1)
Sodium: 137 mmol/L (ref 135–145)

## 2019-02-03 LAB — GLUCOSE, CAPILLARY
Glucose-Capillary: 110 mg/dL — ABNORMAL HIGH (ref 70–99)
Glucose-Capillary: 117 mg/dL — ABNORMAL HIGH (ref 70–99)
Glucose-Capillary: 122 mg/dL — ABNORMAL HIGH (ref 70–99)
Glucose-Capillary: 135 mg/dL — ABNORMAL HIGH (ref 70–99)
Glucose-Capillary: 143 mg/dL — ABNORMAL HIGH (ref 70–99)
Glucose-Capillary: 149 mg/dL — ABNORMAL HIGH (ref 70–99)
Glucose-Capillary: 93 mg/dL (ref 70–99)

## 2019-02-03 LAB — CBC
HCT: 30.6 % — ABNORMAL LOW (ref 39.0–52.0)
Hemoglobin: 9.3 g/dL — ABNORMAL LOW (ref 13.0–17.0)
MCH: 25.8 pg — ABNORMAL LOW (ref 26.0–34.0)
MCHC: 30.4 g/dL (ref 30.0–36.0)
MCV: 85 fL (ref 80.0–100.0)
Platelets: 280 10*3/uL (ref 150–400)
RBC: 3.6 MIL/uL — ABNORMAL LOW (ref 4.22–5.81)
RDW: 16.3 % — ABNORMAL HIGH (ref 11.5–15.5)
WBC: 9.6 10*3/uL (ref 4.0–10.5)
nRBC: 0 % (ref 0.0–0.2)

## 2019-02-03 LAB — MAGNESIUM: Magnesium: 1.8 mg/dL (ref 1.7–2.4)

## 2019-02-03 MED ORDER — FREE WATER
300.0000 mL | Freq: Four times a day (QID) | Status: DC
  Administered 2019-02-03 – 2019-02-13 (×36): 300 mL

## 2019-02-03 MED ORDER — POTASSIUM CHLORIDE 20 MEQ/15ML (10%) PO SOLN
30.0000 meq | ORAL | Status: AC
  Administered 2019-02-03 (×2): 30 meq
  Filled 2019-02-03 (×2): qty 30

## 2019-02-03 NOTE — Progress Notes (Signed)
RT note: attempted SBT on patient this AM however patient became apneic and backup ventilation alarmed.  Placed patient back on full support ventilator settings.  Will continue to monitor.

## 2019-02-03 NOTE — Progress Notes (Signed)
  Speech Language Pathology   Patient Details Name: Marc [name] MRN: [id number] DOB: [birth year] Today's Date: 02/03/2019 Time:  -     Order received for  PMV and swallow assessments. Trach placed yesterday and currently on vent. Will see for appropriateness for inline PMV- not expecting to get to this pt today however.    [name] 02/03/2019, 8:27 AM   [name] Lonell Face.Ed Nurse, children's [phone] Office [phone]
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
## 2019-02-03 NOTE — Care Management (Addendum)
CM acknowledges consult to discuss LTACH however unfortunately pt only has Medicare A and therefore LTACH will not be a viable discharge plan as they both also require part B.  CM spoke with pts son regarding lack of needed insurance for LTACH coverage   CM notified by son that pt also has [facility] benefits - son unsure of which clinic pt is established at.  Pt is unresponsive.  CM left VM for transfer coordinator with the [facility] requesting call back

## 2019-02-04 LAB — BASIC METABOLIC PANEL
Anion gap: 12 (ref 5–15)
BUN: 24 mg/dL — ABNORMAL HIGH (ref 8–23)
CO2: 23 mmol/L (ref 22–32)
Calcium: 8.4 mg/dL — ABNORMAL LOW (ref 8.9–10.3)
Chloride: 103 mmol/L (ref 98–111)
Creatinine, Ser: 0.79 mg/dL (ref 0.61–1.24)
GFR calc Af Amer: >60 mL/min (ref >60)
GFR calc non Af Amer: >60 mL/min (ref >60)
Glucose, Bld: 147 mg/dL — ABNORMAL HIGH (ref 70–99)
Potassium: 3.8 mmol/L (ref 3.5–5.1)
Sodium: 138 mmol/L (ref 135–145)

## 2019-02-04 LAB — CBC
HCT: 32.3 % — ABNORMAL LOW (ref 39.0–52.0)
Hemoglobin: 9.9 g/dL — ABNORMAL LOW (ref 13.0–17.0)
MCH: 26.2 pg (ref 26.0–34.0)
MCHC: 30.7 g/dL (ref 30.0–36.0)
MCV: 85.4 fL (ref 80.0–100.0)
Platelets: 289 10*3/uL (ref 150–400)
RBC: 3.78 MIL/uL — ABNORMAL LOW (ref 4.22–5.81)
RDW: 16.5 % — ABNORMAL HIGH (ref 11.5–15.5)
WBC: 10.4 10*3/uL (ref 4.0–10.5)
nRBC: 0 % (ref 0.0–0.2)

## 2019-02-04 LAB — GLUCOSE, CAPILLARY
Glucose-Capillary: 136 mg/dL — ABNORMAL HIGH (ref 70–99)
Glucose-Capillary: 127 mg/dL — ABNORMAL HIGH (ref 70–99)
Glucose-Capillary: 119 mg/dL — ABNORMAL HIGH (ref 70–99)
Glucose-Capillary: 133 mg/dL — ABNORMAL HIGH (ref 70–99)
Glucose-Capillary: 143 mg/dL — ABNORMAL HIGH (ref 70–99)

## 2019-02-04 MED ORDER — VITAL AF 1.2 CAL PO LIQD
1000.0000 mL | ORAL | Status: DC
  Administered 2019-02-04 – 2019-02-13 (×13): 1000 mL
  Filled 2019-02-04 (×3): qty 1000

## 2019-02-04 NOTE — Procedures (Signed)
Cortrak  Person Inserting Tube:  Evagelia Knack, RD Tube Type:  Cortrak - 43 inches Tube Location:  Left nare Initial Placement:  Stomach Technique Used to Measure Tube Placement:  Documented cm marking at nare/ corner of mouth Cortrak Secured At:  71 cm   No x-ray is required. RN may begin using tube.   If the tube becomes dislodged please keep the tube and contact the Cortrak team at [email or web address] (password [email or web address]) for replacement.  If after hours and replacement cannot be delayed, place a NG tube and confirm placement with an abdominal x-ray.    Vanessa Kick RD, LDN Clinical Nutrition Pager # [phone]
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
## 2019-02-04 NOTE — Progress Notes (Signed)
  Speech Language Pathology    Patient Details Name: Marc [name] MRN: [id number] DOB: [birth year] Today's Date: 02/04/2019 Time:  -     Discussed possible inline vent speaking valve with RT. He reports pt is not responsive and would not be appropriate at this time. He may attempt trach collar later today. Will follow                GO                [name] 02/04/2019, 8:58 AM
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
## 2019-02-04 NOTE — Care Management (Addendum)
CM spoke with transfer coordinator of [place]. [facility] and has not yet established care with PCP assigned Dr Reita May.  Per transfer coordinator pt will not be able to receive facility (SNF nor LTACH) consideration due to not being service connected.  [facility] suggest [facility] utilize Medicare benefit for placement needs.  CM text paged pts SW with [facility] O[facility] Perl at [phone]  Pt having cortrak placed for nutrition today.  Pt is s/p trach requiring vent with TC trial initiated this am.  - pt is currently not appropriate for SNF referral .

## 2019-02-04 NOTE — Progress Notes (Signed)
Placed pt. Back on vent. RN had concern pt was tiring out. RT assessed pt. And agreed. Pt. Was suctioned and placed back on the vent for rest. Pt. Is resting comfortably at this time.

## 2019-02-04 NOTE — Progress Notes (Signed)
Pt placed on ATC, 8L, 35%.  Tolerating well at this time.
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
## 2019-02-04 NOTE — Progress Notes (Signed)
Nutrition Follow-up  DOCUMENTATION CODES:   Not applicable  INTERVENTION:   Resume TF via Cortrak:    Change Vital AF 1.2 goal rate to 80 ml/hr via Cortrak (1920 ml per day)  Provides 2304 kcals, 144 grams protein, 1557 ml free water (2757 ml free water total with flushes)   NUTRITION DIAGNOSIS:   Increased nutrient needs related to acute illness as evidenced by estimated needs.  Ongoing  GOAL:   Patient will meet greater than or equal to 90% of their needs   Met with TF  MONITOR:   Diet advancement, Vent status, Skin, TF tolerance, Weight trends, Labs, I & O's  ASSESSMENT:   Patient with PMH significant for reported bipolar disease and DM. Presents this admission with acute encephalopathy from unclear etiology and severe septic shock.   1/21 S/P TEE, native valve endocarditis, small vegetation with mild MR. 1/23 S/P Tracheostomy. 1/26 First attempt at [facility]; tolerating well so far.  1/26 Cortrak placed, tip in the stomach.  Medications: folic acid, SS novolog, free water 300 ml every 6 hours. IVF: NS at 10 ml/h Labs reviewed.  CBG's: 136-127-119   Weight trending back down. I/O + 27 L since admission. New stage II to sacrum.  Diet Order:   Diet Order    None      EDUCATION NEEDS:   Not appropriate for education at this time  Skin:  Skin Assessment: Skin Integrity Issues: Skin Integrity Issues:: Stage II Stage II: sacrum  Last BM:  1/25 type 6  Height:   Ht Readings from Last 1 Encounters:  01/21/19 6' 2" (1.88 m)    Weight:   Wt Readings from Last 1 Encounters:  02/04/19 96.8 kg  01/28/19 102.2 kg 01/23/19 97 kg  01/21/19 100 kg   Ideal Body Weight:  86.4 kg  BMI:  Body mass index is 27.4 kg/m.  Estimated Nutritional Needs:   Kcal:  3017-2091  Protein:  135-155 gm  Fluid:  >/= 2.2 L   Molli Barrows, RD, LDN, [place] Pager [phone] After Hours Pager [phone]

## 2019-02-05 LAB — BASIC METABOLIC PANEL
Anion gap: 11 (ref 5–15)
BUN: 26 mg/dL — ABNORMAL HIGH (ref 8–23)
CO2: 24 mmol/L (ref 22–32)
Calcium: 7.9 mg/dL — ABNORMAL LOW (ref 8.9–10.3)
Chloride: 104 mmol/L (ref 98–111)
Creatinine, Ser: 0.62 mg/dL (ref 0.61–1.24)
GFR calc Af Amer: >60 mL/min (ref >60)
GFR calc non Af Amer: >60 mL/min (ref >60)
Glucose, Bld: 156 mg/dL — ABNORMAL HIGH (ref 70–99)
Potassium: 4.2 mmol/L (ref 3.5–5.1)
Sodium: 139 mmol/L (ref 135–145)

## 2019-02-05 LAB — CBC
HCT: 31.6 % — ABNORMAL LOW (ref 39.0–52.0)
Hemoglobin: 9.4 g/dL — ABNORMAL LOW (ref 13.0–17.0)
MCH: 25.9 pg — ABNORMAL LOW (ref 26.0–34.0)
MCHC: 29.7 g/dL — ABNORMAL LOW (ref 30.0–36.0)
MCV: 87.1 fL (ref 80.0–100.0)
Platelets: 313 10*3/uL (ref 150–400)
RBC: 3.63 MIL/uL — ABNORMAL LOW (ref 4.22–5.81)
RDW: 16.1 % — ABNORMAL HIGH (ref 11.5–15.5)
WBC: 8.6 10*3/uL (ref 4.0–10.5)
nRBC: 0 % (ref 0.0–0.2)

## 2019-02-05 LAB — GLUCOSE, CAPILLARY
Glucose-Capillary: 112 mg/dL — ABNORMAL HIGH (ref 70–99)
Glucose-Capillary: 114 mg/dL — ABNORMAL HIGH (ref 70–99)
Glucose-Capillary: 122 mg/dL — ABNORMAL HIGH (ref 70–99)
Glucose-Capillary: 131 mg/dL — ABNORMAL HIGH (ref 70–99)
Glucose-Capillary: 143 mg/dL — ABNORMAL HIGH (ref 70–99)
Glucose-Capillary: 148 mg/dL — ABNORMAL HIGH (ref 70–99)
Glucose-Capillary: 160 mg/dL — ABNORMAL HIGH (ref 70–99)

## 2019-02-05 LAB — CULTURE, RESPIRATORY W GRAM STAIN

## 2019-02-05 MED ORDER — GLYCOPYRROLATE 0.2 MG/ML IJ SOLN
0.2000 mg | Freq: Four times a day (QID) | INTRAMUSCULAR | Status: DC
  Administered 2019-02-05 – 2019-02-17 (×46): 0.2 mg via INTRAVENOUS
  Filled 2019-02-05 (×45): qty 1

## 2019-02-05 NOTE — Progress Notes (Signed)
CSW sent clinicals to [facility] for review.   Edwin Dada, MSW, LCSW-A Transitions of Care  Clinical Social Worker  [facility] Emergency Departments  Medical ICU [phone]

## 2019-02-05 NOTE — Progress Notes (Signed)
eLink Physician-Brief Progress Note Patient Name: Marc [name] DOB: 07-Jun-1955 MRN: [id number]   Date of Service  02/05/2019  HPI/Events of Note  Pt with ventilator dyssynchrony with high airway pressures on PRVC. Pt reportedly had enteral nutrition-like matter coming out of ET tube earlier today.  No CXR or ABG x > 1 week  eICU Interventions  Pt switched to Pressure control targeting Vt of 6 ml/kg, RR increased to 20 to maintain same minute ventilation, Portable CXR and ABG ordered.        Thomasene Lot [name] 02/05/2019, 11:36 PM
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
## 2019-02-06 ENCOUNTER — Inpatient Hospital Stay (HOSPITAL_COMMUNITY): Payer: Medicare Other

## 2019-02-06 LAB — POCT I-STAT 7, (LYTES, BLD GAS, ICA,H+H)
Acid-Base Excess: 7 mmol/L — ABNORMAL HIGH (ref 0.0–2.0)
Bicarbonate: 31.5 mmol/L — ABNORMAL HIGH (ref 20.0–28.0)
Calcium, Ion: 1.22 mmol/L (ref 1.15–1.40)
HCT: 27 % — ABNORMAL LOW (ref 39.0–52.0)
Hemoglobin: 9.2 g/dL — ABNORMAL LOW (ref 13.0–17.0)
O2 Saturation: 99 %
Patient temperature: 98.7
Potassium: 3.6 mmol/L (ref 3.5–5.1)
Sodium: 140 mmol/L (ref 135–145)
TCO2: 33 mmol/L — ABNORMAL HIGH (ref 22–32)
pCO2 arterial: 44.1 mmHg (ref 32.0–48.0)
pH, Arterial: 7.462 — ABNORMAL HIGH (ref 7.350–7.450)
pO2, Arterial: 112 mmHg — ABNORMAL HIGH (ref 83.0–108.0)

## 2019-02-06 LAB — CBC
HCT: 27.9 % — ABNORMAL LOW (ref 39.0–52.0)
Hemoglobin: 8.7 g/dL — ABNORMAL LOW (ref 13.0–17.0)
MCH: 26.4 pg (ref 26.0–34.0)
MCHC: 31.2 g/dL (ref 30.0–36.0)
MCV: 84.5 fL (ref 80.0–100.0)
Platelets: 358 10*3/uL (ref 150–400)
RBC: 3.3 MIL/uL — ABNORMAL LOW (ref 4.22–5.81)
RDW: 16 % — ABNORMAL HIGH (ref 11.5–15.5)
WBC: 7.7 10*3/uL (ref 4.0–10.5)
nRBC: 0 % (ref 0.0–0.2)

## 2019-02-06 LAB — BASIC METABOLIC PANEL
Anion gap: 10 (ref 5–15)
BUN: 20 mg/dL (ref 8–23)
CO2: 29 mmol/L (ref 22–32)
Calcium: 8.5 mg/dL — ABNORMAL LOW (ref 8.9–10.3)
Chloride: 103 mmol/L (ref 98–111)
Creatinine, Ser: 0.58 mg/dL — ABNORMAL LOW (ref 0.61–1.24)
GFR calc Af Amer: >60 mL/min (ref >60)
GFR calc non Af Amer: >60 mL/min (ref >60)
Glucose, Bld: 96 mg/dL (ref 70–99)
Potassium: 3.7 mmol/L (ref 3.5–5.1)
Sodium: 142 mmol/L (ref 135–145)

## 2019-02-06 LAB — GLUCOSE, CAPILLARY
Glucose-Capillary: 97 mg/dL (ref 70–99)
Glucose-Capillary: 105 mg/dL — ABNORMAL HIGH (ref 70–99)
Glucose-Capillary: 129 mg/dL — ABNORMAL HIGH (ref 70–99)
Glucose-Capillary: 162 mg/dL — ABNORMAL HIGH (ref 70–99)
Glucose-Capillary: 90 mg/dL (ref 70–99)
Glucose-Capillary: 148 mg/dL — ABNORMAL HIGH (ref 70–99)

## 2019-02-06 MED ORDER — POLYETHYLENE GLYCOL 3350 17 G PO PACK
17.0000 g | PACK | Freq: Every day | ORAL | Status: DC
  Administered 2019-02-06 – 2019-02-13 (×8): 17 g
  Filled 2019-02-06 (×8): qty 1

## 2019-02-06 MED ORDER — INFLUENZA VAC SPLIT QUAD 0.5 ML IM SUSY
0.5000 mL | PREFILLED_SYRINGE | INTRAMUSCULAR | Status: AC
  Administered 2019-02-08: 10:00:00 0.5 mL via INTRAMUSCULAR
  Filled 2019-02-06: qty 0.5

## 2019-02-06 MED ORDER — SENNOSIDES 8.8 MG/5ML PO SYRP
5.0000 mL | ORAL_SOLUTION | Freq: Every day | ORAL | Status: DC
  Administered 2019-02-06 – 2019-02-12 (×7): 5 mL via ORAL
  Filled 2019-02-06 (×7): qty 5

## 2019-02-06 NOTE — Progress Notes (Signed)
RT note- Patient paced back on vent with concerns of tachycardia, HR now 120, no distress noted, but appears to be very sleepy with possible hypoventilation. Continue to monitor.

## 2019-02-06 NOTE — Progress Notes (Signed)
Patient had an unwitnessed fall- found on floor, with small right upper eyebrow laceration. Condom cath off  and disconnected from mech vent (trach). Immediately placed by on mechanical vent, is awake and following commands. Assisted back to bed with assistance of 3 nurses. Patient able to stand up with help and returned to bed. Patient denies pain or headache. Patient was sitting up in chair with chair alarm. MD Sood called to bedside. Assessed patient's neurological status- intact. Following commands. Stated no Head CT for now, continue to monitor. I called son, [name], to update about father's status. Patient moved to front side of nursing station. Will continue to monitor.

## 2019-02-06 NOTE — Progress Notes (Signed)
Patient removed NG tube again. Called for possible sitter or restraints, since also had a fall today and will not keep mitts on. Will Continue to monitor.   Cranford Mon

## 2019-02-06 NOTE — Progress Notes (Signed)
RN noticed new large bulge on patient left inguinal fold. On review of CT Abdomen  Pelvis on 01/22/2019 : Left inguinal hernia seen containing loops of colon. On exam area is nonpainful, soft, non erythematous, and not warm to touch. Looking at merged chart patient has history of left inguinal hernia seen on imaging in 2018. Plan:  Continue to monitor for signs of bowel entrapment.

## 2019-02-06 NOTE — Progress Notes (Signed)
Patient found trying to get out of bed again. Incidentally he has pulled out his Cortrak. I have placed an NG tube for now.

## 2019-02-06 NOTE — Progress Notes (Signed)
High pressure alarm continuously going off.  Tried for several hours to correct the issue, called respiratory and suggested to try patient back on PC mode, as day RN stated he never fought the vent on that setting.  MD Ogen made aware, new vent setting tried and patient apears much more comfortable.  Patient pulled off condom cath and voided in the bed.  New linen placed and patient cleaned.  Patient pulled out left midline Peripheral access.  Minimal bleeding, site is clean and now has dressing on it.  Unsure how to prevent these events from happening.  Will continue to frequently round on this patient.
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
## 2019-02-06 NOTE — Progress Notes (Signed)
Pt sitting in chair.  Larey Seat out of chair.  Small laceration over Rt eye w/o significant bleeding.  No neck/back pain.  Alert, follows commands, moves all extremities.  Coralyn Helling, MD [facility] Pulmonary/Critical Care 02/06/2019, 1:41 PM
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
## 2019-02-06 NOTE — Progress Notes (Signed)
eLink Physician-Brief Progress Note Patient Name: Marc [name] DOB: 01-Mar-1955 MRN: [id number]   Date of Service  02/06/2019  HPI/Events of Note  Pt with ongoing confusion , pulling out tubes, intermittent following commands. No change from baseline . Had fall today with no change in neuro status per RN . RN requests wrist restraints to prevent patient harm . Has pulled out NGT x 2 .   eICU Interventions  Wrist restraints order given x 24hr only  , re-eval in am . Cont to orient patient . May need sitter .      Intervention Category Minor Interventions: Agitation / anxiety - evaluation and management  Thelmer Legler 02/06/2019, 9:15 PM

## 2019-02-06 NOTE — Progress Notes (Signed)
SLP Cancellation Note  Patient Details Name: XZAVION DOSWELL MRN: [id number] DOB: 1955/02/12   Cancelled treatment:        Spoke with RT earlier about attempting inline PMV assessment this afternoon. Noted pt fell out of chair, forehead laceration, disconnected from vent. Will defer today and allow pt to rest. F/U tomorrow.   [name] 02/06/2019, 2:43 PM  [name] Lonell Face.Ed Nurse, children's [phone] Office [phone]

## 2019-02-07 DIAGNOSIS — L89152 Pressure ulcer of sacral region, stage 2: Secondary | ICD-10-CM

## 2019-02-07 LAB — CBC
HCT: 28.2 % — ABNORMAL LOW (ref 39.0–52.0)
Hemoglobin: 8.4 g/dL — ABNORMAL LOW (ref 13.0–17.0)
MCH: 25.3 pg — ABNORMAL LOW (ref 26.0–34.0)
MCHC: 29.8 g/dL — ABNORMAL LOW (ref 30.0–36.0)
MCV: 84.9 fL (ref 80.0–100.0)
Platelets: 334 10*3/uL (ref 150–400)
RBC: 3.32 MIL/uL — ABNORMAL LOW (ref 4.22–5.81)
RDW: 15.9 % — ABNORMAL HIGH (ref 11.5–15.5)
WBC: 8.2 10*3/uL (ref 4.0–10.5)
nRBC: 0 % (ref 0.0–0.2)

## 2019-02-07 LAB — BASIC METABOLIC PANEL
Anion gap: 10 (ref 5–15)
BUN: 21 mg/dL (ref 8–23)
CO2: 30 mmol/L (ref 22–32)
Calcium: 8.6 mg/dL — ABNORMAL LOW (ref 8.9–10.3)
Chloride: 101 mmol/L (ref 98–111)
Creatinine, Ser: 0.64 mg/dL (ref 0.61–1.24)
GFR calc Af Amer: >60 mL/min (ref >60)
GFR calc non Af Amer: >60 mL/min (ref >60)
Glucose, Bld: 149 mg/dL — ABNORMAL HIGH (ref 70–99)
Potassium: 3.7 mmol/L (ref 3.5–5.1)
Sodium: 141 mmol/L (ref 135–145)

## 2019-02-07 LAB — GLUCOSE, CAPILLARY
Glucose-Capillary: 129 mg/dL — ABNORMAL HIGH (ref 70–99)
Glucose-Capillary: 92 mg/dL (ref 70–99)
Glucose-Capillary: 165 mg/dL — ABNORMAL HIGH (ref 70–99)
Glucose-Capillary: 135 mg/dL — ABNORMAL HIGH (ref 70–99)
Glucose-Capillary: 113 mg/dL — ABNORMAL HIGH (ref 70–99)
Glucose-Capillary: 169 mg/dL — ABNORMAL HIGH (ref 70–99)
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
## 2019-02-07 NOTE — Care Management (Addendum)
CM acknowledges that pt is approved for [facility] stay via [facility] benefit.  CM re-consulted both [facility] and [facility] - pt under review   Update:  Both [facility] and [facility] will offer a bed when available - potentially next week.  CM provided choice of LTACHs to pt son - he plans to research this this weekend.
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
## 2019-02-07 NOTE — Procedures (Signed)
Cortrak  Person Inserting Tube:  King, Maly Lemarr E, RD Tube Type:  Cortrak - 43 inches Tube Location:  Left nare Initial Placement:  Stomach Secured by: Bridle Technique Used to Measure Tube Placement:  Documented cm marking at nare/ corner of mouth Cortrak Secured At:  73 cm    Cortrak Tube Team Note:  Consult received to place a Cortrak feeding tube.   No x-ray is required. RN may begin using tube.   If the tube becomes dislodged please keep the tube and contact the Cortrak team at [email or web address] ([email or web address]) for replacement.  If after hours and replacement cannot be delayed, place a NG tube and confirm placement with an abdominal x-ray.   Tatisha Cerino King, MS, RD, LDN Office: [phone] Pager: [phone] After Hours/Weekend Pager: [phone] 

## 2019-02-07 NOTE — Progress Notes (Addendum)
CSW received return call from staff member at [place] who reports this patient was approved for a 90 day skilled care contract with the initial 30 days for rehabilitation. CSW received list of [facility] contracted facilities.  Edwin Dada, MSW, LCSW-A Transitions of Care  Clinical Social Worker  [facility] Emergency Departments  Medical ICU [phone]

## 2019-02-07 NOTE — Consult Note (Addendum)
WOC Nurse Consult Note: Reason for Consult: Consult requested for wound near trach site. Wound type: Pt has developed a full thickness wound underneath the lower faceplate area; it is difficult to keep the faceplate from rubbing against this location related to the patient's short thick neck size and the way he positions his head. The trach is still sutured to the skin at both sides, making it challenging to slip a dressing underneath. Measurement: .2X1X.2cm Wound bed: red and moist, no odor, small amt tan drainage.  Pt has large amt thick tan secretions and it is difficult to keep the area from becoming soiled. Dressing procedure/placement/frequency: Topical treatment orders provided for staff nurses to perform daily as follows to absorb drainage, provide antimicrobial benefits, and add extra padding underneath the trach faceplate: Cut piece of Aquacel [place] # [phone]) and apply over wound underneath trach faceplate Q M/W/F, then cover with foam dressing. Change split thickness gauze over trach site PRN soiling. Please re-consult if further assistance is needed.  Thank-you,  Cammie Mcgee MSN, RN, CWOCN, [place], CNS [phone]

## 2019-02-07 NOTE — Progress Notes (Signed)
Speech therapy did trial of in-line PMV with patient with RT at bedside. Patient suctioned before. Cuff was deflated and PEEP down to 0. Patient spoke successfully. After PMV taken out, cuff was inflated and PEEP back to 5.
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
## 2019-02-08 LAB — BASIC METABOLIC PANEL
Anion gap: 6 (ref 5–15)
BUN: 19 mg/dL (ref 8–23)
CO2: 33 mmol/L — ABNORMAL HIGH (ref 22–32)
Calcium: 8.4 mg/dL — ABNORMAL LOW (ref 8.9–10.3)
Chloride: 102 mmol/L (ref 98–111)
Creatinine, Ser: 0.57 mg/dL — ABNORMAL LOW (ref 0.61–1.24)
GFR calc Af Amer: >60 mL/min (ref >60)
GFR calc non Af Amer: >60 mL/min (ref >60)
Glucose, Bld: 127 mg/dL — ABNORMAL HIGH (ref 70–99)
Potassium: 3.7 mmol/L (ref 3.5–5.1)
Sodium: 141 mmol/L (ref 135–145)

## 2019-02-08 LAB — CBC
HCT: 27.8 % — ABNORMAL LOW (ref 39.0–52.0)
Hemoglobin: 8.3 g/dL — ABNORMAL LOW (ref 13.0–17.0)
MCH: 25.6 pg — ABNORMAL LOW (ref 26.0–34.0)
MCHC: 29.9 g/dL — ABNORMAL LOW (ref 30.0–36.0)
MCV: 85.8 fL (ref 80.0–100.0)
Platelets: 328 10*3/uL (ref 150–400)
RBC: 3.24 MIL/uL — ABNORMAL LOW (ref 4.22–5.81)
RDW: 15.9 % — ABNORMAL HIGH (ref 11.5–15.5)
WBC: 6 10*3/uL (ref 4.0–10.5)
nRBC: 0 % (ref 0.0–0.2)

## 2019-02-08 LAB — GLUCOSE, CAPILLARY
Glucose-Capillary: 118 mg/dL — ABNORMAL HIGH (ref 70–99)
Glucose-Capillary: 158 mg/dL — ABNORMAL HIGH (ref 70–99)
Glucose-Capillary: 117 mg/dL — ABNORMAL HIGH (ref 70–99)
Glucose-Capillary: 137 mg/dL — ABNORMAL HIGH (ref 70–99)
Glucose-Capillary: 86 mg/dL (ref 70–99)

## 2019-02-08 LAB — MAGNESIUM: Magnesium: 2.2 mg/dL (ref 1.7–2.4)

## 2019-02-08 LAB — PHOSPHORUS: Phosphorus: 3.9 mg/dL (ref 2.5–4.6)

## 2019-02-08 NOTE — Progress Notes (Signed)
Assisted tele visit to patient with family member.  Kairy Folsom Ann, RN  
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
## 2019-02-08 NOTE — Progress Notes (Signed)
RT note: patient placed on 40%.  Currently tolerating well.  Will continue to monitor.

## 2019-02-08 NOTE — Progress Notes (Signed)
Pt has severe cracking and bleeding eczema on his entire body despite documentation of BID triamcinolone cream application. Discontinuing CHG wipes at this time per protocol, presence of broken skin. "Allergy" to CHG added to chart.

## 2019-02-09 LAB — GLUCOSE, CAPILLARY
Glucose-Capillary: 110 mg/dL — ABNORMAL HIGH (ref 70–99)
Glucose-Capillary: 165 mg/dL — ABNORMAL HIGH (ref 70–99)
Glucose-Capillary: 168 mg/dL — ABNORMAL HIGH (ref 70–99)
Glucose-Capillary: 175 mg/dL — ABNORMAL HIGH (ref 70–99)
Glucose-Capillary: 242 mg/dL — ABNORMAL HIGH (ref 70–99)
Glucose-Capillary: 90 mg/dL (ref 70–99)

## 2019-02-09 MED ORDER — METHYLPREDNISOLONE SODIUM SUCC 125 MG IJ SOLR
40.0000 mg | Freq: Once | INTRAMUSCULAR | Status: AC
  Administered 2019-02-09: 12:00:00 40 mg via INTRAVENOUS
  Filled 2019-02-09: qty 2

## 2019-02-09 NOTE — Progress Notes (Signed)
2300 pt began to desat with SpO2 in 60s-70s on trach collar, difficulty clearing secretions. HR 120's briefly, bagged pt for approximately 2 minutes before RT placed pt back on full support on vent. VS now normalized to previous, pt reports no distress.

## 2019-02-09 NOTE — Progress Notes (Signed)
RT note: sutures removed from trach site per MD order.  Patient tolerated well.

## 2019-02-09 NOTE — Progress Notes (Signed)
RT note: patient placed on 40% ATC.  Currently tolerating well.  Will continue to monitor.  
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
## 2019-02-10 LAB — COMPREHENSIVE METABOLIC PANEL
ALT: 8 U/L (ref 0–44)
AST: 20 U/L (ref 15–41)
Albumin: 1.7 g/dL — ABNORMAL LOW (ref 3.5–5.0)
Alkaline Phosphatase: 175 U/L — ABNORMAL HIGH (ref 38–126)
Anion gap: 8 (ref 5–15)
BUN: 20 mg/dL (ref 8–23)
CO2: 34 mmol/L — ABNORMAL HIGH (ref 22–32)
Calcium: 8.7 mg/dL — ABNORMAL LOW (ref 8.9–10.3)
Chloride: 102 mmol/L (ref 98–111)
Creatinine, Ser: 0.59 mg/dL — ABNORMAL LOW (ref 0.61–1.24)
GFR calc Af Amer: >60 mL/min (ref >60)
GFR calc non Af Amer: >60 mL/min (ref >60)
Glucose, Bld: 92 mg/dL (ref 70–99)
Potassium: 4.6 mmol/L (ref 3.5–5.1)
Sodium: 144 mmol/L (ref 135–145)
Total Bilirubin: 0.4 mg/dL (ref 0.3–1.2)
Total Protein: 6.2 g/dL — ABNORMAL LOW (ref 6.5–8.1)

## 2019-02-10 LAB — CBC
HCT: 28.6 % — ABNORMAL LOW (ref 39.0–52.0)
Hemoglobin: 8.3 g/dL — ABNORMAL LOW (ref 13.0–17.0)
MCH: 25.2 pg — ABNORMAL LOW (ref 26.0–34.0)
MCHC: 29 g/dL — ABNORMAL LOW (ref 30.0–36.0)
MCV: 86.9 fL (ref 80.0–100.0)
Platelets: 302 10*3/uL (ref 150–400)
RBC: 3.29 MIL/uL — ABNORMAL LOW (ref 4.22–5.81)
RDW: 15.8 % — ABNORMAL HIGH (ref 11.5–15.5)
WBC: 6.7 10*3/uL (ref 4.0–10.5)
nRBC: 0 % (ref 0.0–0.2)

## 2019-02-10 LAB — MAGNESIUM: Magnesium: 2.1 mg/dL (ref 1.7–2.4)

## 2019-02-10 LAB — GLUCOSE, CAPILLARY
Glucose-Capillary: 141 mg/dL — ABNORMAL HIGH (ref 70–99)
Glucose-Capillary: 162 mg/dL — ABNORMAL HIGH (ref 70–99)
Glucose-Capillary: 165 mg/dL — ABNORMAL HIGH (ref 70–99)
Glucose-Capillary: 174 mg/dL — ABNORMAL HIGH (ref 70–99)
Glucose-Capillary: 183 mg/dL — ABNORMAL HIGH (ref 70–99)
Glucose-Capillary: 210 mg/dL — ABNORMAL HIGH (ref 70–99)
Glucose-Capillary: 81 mg/dL (ref 70–99)

## 2019-02-10 MED ORDER — METHYLPREDNISOLONE SODIUM SUCC 125 MG IJ SOLR
40.0000 mg | Freq: Once | INTRAMUSCULAR | Status: AC
  Administered 2019-02-10: 10:00:00 40 mg via INTRAVENOUS
  Filled 2019-02-10: qty 2

## 2019-02-10 NOTE — Progress Notes (Signed)
RT note: patient placed on 40% ATC.  Currently tolerating well.  Will continue to monitor.  

## 2019-02-10 NOTE — Care Management (Signed)
Neither [facility] nor [facility] have LTACH beds today.  TOC will continue to follow
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
## 2019-02-11 LAB — BASIC METABOLIC PANEL
Anion gap: 11 (ref 5–15)
BUN: 23 mg/dL (ref 8–23)
CO2: 34 mmol/L — ABNORMAL HIGH (ref 22–32)
Calcium: 8.6 mg/dL — ABNORMAL LOW (ref 8.9–10.3)
Chloride: 99 mmol/L (ref 98–111)
Creatinine, Ser: 0.54 mg/dL — ABNORMAL LOW (ref 0.61–1.24)
GFR calc Af Amer: >60 mL/min (ref >60)
GFR calc non Af Amer: >60 mL/min (ref >60)
Glucose, Bld: 178 mg/dL — ABNORMAL HIGH (ref 70–99)
Potassium: 4.6 mmol/L (ref 3.5–5.1)
Sodium: 144 mmol/L (ref 135–145)

## 2019-02-11 LAB — CBC
HCT: 30.2 % — ABNORMAL LOW (ref 39.0–52.0)
Hemoglobin: 8.7 g/dL — ABNORMAL LOW (ref 13.0–17.0)
MCH: 25.5 pg — ABNORMAL LOW (ref 26.0–34.0)
MCHC: 28.8 g/dL — ABNORMAL LOW (ref 30.0–36.0)
MCV: 88.6 fL (ref 80.0–100.0)
Platelets: 284 10*3/uL (ref 150–400)
RBC: 3.41 MIL/uL — ABNORMAL LOW (ref 4.22–5.81)
RDW: 15.9 % — ABNORMAL HIGH (ref 11.5–15.5)
WBC: 7.2 10*3/uL (ref 4.0–10.5)
nRBC: 0.3 % — ABNORMAL HIGH (ref 0.0–0.2)

## 2019-02-11 LAB — GLUCOSE, CAPILLARY
Glucose-Capillary: 118 mg/dL — ABNORMAL HIGH (ref 70–99)
Glucose-Capillary: 132 mg/dL — ABNORMAL HIGH (ref 70–99)
Glucose-Capillary: 135 mg/dL — ABNORMAL HIGH (ref 70–99)
Glucose-Capillary: 144 mg/dL — ABNORMAL HIGH (ref 70–99)
Glucose-Capillary: 155 mg/dL — ABNORMAL HIGH (ref 70–99)
Glucose-Capillary: 173 mg/dL — ABNORMAL HIGH (ref 70–99)

## 2019-02-11 LAB — MAGNESIUM: Magnesium: 2.1 mg/dL (ref 1.7–2.4)

## 2019-02-11 MED ORDER — DIPHENHYDRAMINE HCL 50 MG/ML IJ SOLN
25.0000 mg | Freq: Once | INTRAMUSCULAR | Status: AC
  Administered 2019-02-11: 25 mg via INTRAVENOUS
  Filled 2019-02-11: qty 1

## 2019-02-11 MED ORDER — DIPHENHYDRAMINE HCL 12.5 MG/5ML PO ELIX
25.0000 mg | ORAL_SOLUTION | Freq: Three times a day (TID) | ORAL | Status: DC | PRN
  Administered 2019-02-11 – 2019-02-24 (×5): 25 mg
  Filled 2019-02-11 (×7): qty 10

## 2019-02-11 NOTE — Progress Notes (Signed)
Nutrition Follow-up  DOCUMENTATION CODES:   Not applicable  INTERVENTION:   Continue TF via Cortrak:    Vital AF 1.2 at 80 ml/hr via Cortrak (1920 ml per day)   Provides 2304 kcals, 144 grams protein, 1557 ml free water (2757 ml free water total with flushes)   NUTRITION DIAGNOSIS:   Increased nutrient needs related to acute illness as evidenced by estimated needs.  Ongoing  GOAL:   Patient will meet greater than or equal to 90% of their needs   Met with TF  MONITOR:   Diet advancement, Vent status, Skin, TF tolerance, Weight trends, Labs, I & O's  ASSESSMENT:   Patient with PMH significant for reported bipolar disease and DM. Presents this admission with acute encephalopathy from unclear etiology and severe septic shock.   1/21 S/P TEE, native valve endocarditis, small vegetation with mild MR. 1/23 S/P Tracheostomy. 1/26 First attempt at [facility]. 1/26 Cortrak placed, tip in the stomach.  Currently on trach collar, but has been requiring vent support at night.   SLP working with patient on PMV use. Not quite ready for swallow evaluation at this time, but hopefully will be soon. Remains NPO, receiving 100% of nutrition needs via Cortrak.  Currently receiving Vital AF 1.2 at 80 ml/h via Cortrak, providing 2304 kcal, 144 gm protein, 1557 ml free water daily.   Medications: folic acid, SS novolog, free water 300 ml every 6 hours, miralax, senokot.  Labs reviewed.  CBG's: [phone]  I/O + 46.7 L since admission.  Diet Order:   Diet Order    None      EDUCATION NEEDS:   Not appropriate for education at this time  Skin:  Skin Assessment: Skin Integrity Issues: Skin Integrity Issues:: Other (Comment) Stage II: sacrum Other: full thickness wound at trach site  Last BM:  1/27  Height:   Ht Readings from Last 1 Encounters:  01/21/19 6' 2"  (1.88 m)    Weight:   Wt Readings from Last 1 Encounters:  02/11/19 97.3 kg  02/04/19 96.8 kg 01/28/19 102.2  kg 01/23/19 97 kg  01/21/19 100 kg   Ideal Body Weight:  86.4 kg  BMI:  Body mass index is 27.54 kg/m.  Estimated Nutritional Needs:   Kcal:  0071-2197  Protein:  135-155 gm  Fluid:  >/= 2.2 L   Molli Barrows, RD, LDN, [place] Pager [phone] After Hours Pager [phone]
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
## 2019-02-11 NOTE — Progress Notes (Addendum)
2:45pm: CSW attempted to reach [place] at the [place] once again without success, additional voicemail was left.  1:30pm: CSW spoke with Lauren at [facility] who requested CSW provide her with [facility] documentation. CSW provided email that stated patient's approval for 90 day placement.  CSW sent same fax to [place] at [facility].   10am: CSW spoke with Corina at [facility] to provide her with patient's SSN. Corina requested CSW obtain documentation with approval for placement from [place] be faxed to [phone] when available.  CSW attempted to reach [place] at [place] in [place] to obtain this document, a voicemail was left and an email was sent stating this request.  CSW attempted to reach Lauren with [facility] to provide her with the patient's SSN but was unsuccessful so a voicemail was left requesting a return call.  Edwin Dada, MSW, LCSW-A Transitions of Care  Clinical Social Worker  [facility] Emergency Departments  Medical ICU [phone]
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
## 2019-02-12 ENCOUNTER — Inpatient Hospital Stay (HOSPITAL_COMMUNITY): Payer: Medicare Other

## 2019-02-12 LAB — COMPREHENSIVE METABOLIC PANEL
ALT: 5 U/L (ref 0–44)
AST: 14 U/L — ABNORMAL LOW (ref 15–41)
Albumin: 2 g/dL — ABNORMAL LOW (ref 3.5–5.0)
Alkaline Phosphatase: 125 U/L (ref 38–126)
Anion gap: 13 (ref 5–15)
BUN: 21 mg/dL (ref 8–23)
CO2: 35 mmol/L — ABNORMAL HIGH (ref 22–32)
Calcium: 8.5 mg/dL — ABNORMAL LOW (ref 8.9–10.3)
Chloride: 99 mmol/L (ref 98–111)
Creatinine, Ser: 0.52 mg/dL — ABNORMAL LOW (ref 0.61–1.24)
GFR calc Af Amer: >60 mL/min (ref >60)
GFR calc non Af Amer: >60 mL/min (ref >60)
Glucose, Bld: 111 mg/dL — ABNORMAL HIGH (ref 70–99)
Potassium: 4.6 mmol/L (ref 3.5–5.1)
Sodium: 147 mmol/L — ABNORMAL HIGH (ref 135–145)
Total Bilirubin: 0.5 mg/dL (ref 0.3–1.2)
Total Protein: 6.3 g/dL — ABNORMAL LOW (ref 6.5–8.1)

## 2019-02-12 LAB — GLUCOSE, CAPILLARY
Glucose-Capillary: 109 mg/dL — ABNORMAL HIGH (ref 70–99)
Glucose-Capillary: 158 mg/dL — ABNORMAL HIGH (ref 70–99)
Glucose-Capillary: 145 mg/dL — ABNORMAL HIGH (ref 70–99)
Glucose-Capillary: 63 mg/dL — ABNORMAL LOW (ref 70–99)
Glucose-Capillary: 113 mg/dL — ABNORMAL HIGH (ref 70–99)
Glucose-Capillary: 151 mg/dL — ABNORMAL HIGH (ref 70–99)

## 2019-02-12 LAB — CBC
HCT: 32.1 % — ABNORMAL LOW (ref 39.0–52.0)
Hemoglobin: 9.4 g/dL — ABNORMAL LOW (ref 13.0–17.0)
MCH: 25.5 pg — ABNORMAL LOW (ref 26.0–34.0)
MCHC: 29.3 g/dL — ABNORMAL LOW (ref 30.0–36.0)
MCV: 87.2 fL (ref 80.0–100.0)
Platelets: 302 10*3/uL (ref 150–400)
RBC: 3.68 MIL/uL — ABNORMAL LOW (ref 4.22–5.81)
RDW: 15.9 % — ABNORMAL HIGH (ref 11.5–15.5)
WBC: 8.2 10*3/uL (ref 4.0–10.5)
nRBC: 0.4 % — ABNORMAL HIGH (ref 0.0–0.2)

## 2019-02-12 LAB — MAGNESIUM: Magnesium: 1.9 mg/dL (ref 1.7–2.4)

## 2019-02-12 MED ORDER — DEXTROSE 50 % IV SOLN
INTRAVENOUS | Status: AC
  Administered 2019-02-12: 50 mL
  Filled 2019-02-12: qty 50

## 2019-02-12 MED ORDER — ORAL CARE MOUTH RINSE
15.0000 mL | Freq: Four times a day (QID) | OROMUCOSAL | Status: DC
  Administered 2019-02-12 – 2019-02-14 (×7): 15 mL via OROMUCOSAL

## 2019-02-12 NOTE — Plan of Care (Signed)
   Problem: Clinical Measurements: Goal: Respiratory complications will improve Outcome: Progressing Note: Pt is tolerating trach collar well during the day and vent at night. Will continue to attempt trach collar as long as patient can tolerate   Problem: Nutrition: Goal: Adequate nutrition will be maintained Outcome: Progressing Note: Pt is tolerating tube feeds well.   Problem: Elimination: Goal: Will not experience complications related to urinary retention Outcome: Progressing   Problem: Pain Managment: Goal: General experience of comfort will improve Outcome: Progressing

## 2019-02-12 NOTE — Progress Notes (Signed)
Assisted tele visit to patient with family member.  Jacinta Penalver Anderson, RN   

## 2019-02-12 NOTE — Progress Notes (Signed)
CSW spoke with Leotis Shames of [facility] who is requesting this patient's [employer] benefit plan information.  CSW attempted to reach patient's [employer] social worker Otho Perl at [phone] ext. [phone] without success - a voicemail was left requesting a return call.  Edwin Dada, MSW, LCSW-A Transitions of Care  Clinical Social Worker  [facility] Emergency Departments  Medical ICU [phone]

## 2019-02-12 NOTE — Progress Notes (Addendum)
Pt had an unwitnessed fall at 1200 this afternoon, 02/12/2019. RN was in other patients room when a loud "thud" was heard. RN immediately went to room and patient was found on floor. He was leaning on left side with one arm holding him up. He was alert and following commands. There was no injury and when asked if patient was in pain, he shook his head no. Pt was transported back to bed by three other healthcare employees. Pt was hooked up to the monitor and all vital signs were within normal limits. During fall, peripheral IV came out and cortrak came out as well. New IV was placed in right forearm and NG tube placed as well with verification of placement with xray.  Son, Nikolaj Geraghty was updated of fall. All questions answered.  Dr. Tyson Babinski, Internal medicine doctor, paged to call regarding fall. RN suggested Posey belt for patient, but MD did not want to order it at this time. Dr. Celine Mans, CCM also made aware of fall.
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
## 2019-02-13 ENCOUNTER — Inpatient Hospital Stay (HOSPITAL_COMMUNITY): Payer: Medicare Other

## 2019-02-13 DIAGNOSIS — Z430 Encounter for attention to tracheostomy: Secondary | ICD-10-CM

## 2019-02-13 LAB — BASIC METABOLIC PANEL
Anion gap: 11 (ref 5–15)
BUN: 16 mg/dL (ref 8–23)
CO2: 36 mmol/L — ABNORMAL HIGH (ref 22–32)
Calcium: 8.4 mg/dL — ABNORMAL LOW (ref 8.9–10.3)
Chloride: 97 mmol/L — ABNORMAL LOW (ref 98–111)
Creatinine, Ser: 0.55 mg/dL — ABNORMAL LOW (ref 0.61–1.24)
GFR calc Af Amer: >60 mL/min (ref >60)
GFR calc non Af Amer: >60 mL/min (ref >60)
Glucose, Bld: 134 mg/dL — ABNORMAL HIGH (ref 70–99)
Potassium: 4.1 mmol/L (ref 3.5–5.1)
Sodium: 144 mmol/L (ref 135–145)

## 2019-02-13 LAB — CBC
HCT: 34.6 % — ABNORMAL LOW (ref 39.0–52.0)
Hemoglobin: 10 g/dL — ABNORMAL LOW (ref 13.0–17.0)
MCH: 25 pg — ABNORMAL LOW (ref 26.0–34.0)
MCHC: 28.9 g/dL — ABNORMAL LOW (ref 30.0–36.0)
MCV: 86.5 fL (ref 80.0–100.0)
Platelets: 285 10*3/uL (ref 150–400)
RBC: 4 MIL/uL — ABNORMAL LOW (ref 4.22–5.81)
RDW: 15.9 % — ABNORMAL HIGH (ref 11.5–15.5)
WBC: 10.8 10*3/uL — ABNORMAL HIGH (ref 4.0–10.5)
nRBC: 0.2 % (ref 0.0–0.2)

## 2019-02-13 LAB — GLUCOSE, CAPILLARY
Glucose-Capillary: 116 mg/dL — ABNORMAL HIGH (ref 70–99)
Glucose-Capillary: 150 mg/dL — ABNORMAL HIGH (ref 70–99)
Glucose-Capillary: 123 mg/dL — ABNORMAL HIGH (ref 70–99)
Glucose-Capillary: 63 mg/dL — ABNORMAL LOW (ref 70–99)
Glucose-Capillary: 95 mg/dL (ref 70–99)
Glucose-Capillary: 98 mg/dL (ref 70–99)
Glucose-Capillary: 80 mg/dL (ref 70–99)

## 2019-02-13 LAB — MAGNESIUM: Magnesium: 1.9 mg/dL (ref 1.7–2.4)

## 2019-02-13 MED ORDER — ZOLPIDEM TARTRATE 5 MG PO TABS: 5.0000 mg | ORAL_TABLET | Freq: Every evening | ORAL | Status: DC | PRN

## 2019-02-13 MED ORDER — GUAIFENESIN 100 MG/5ML PO SOLN: 30.0000 mL | Freq: Two times a day (BID) | ORAL | Status: DC

## 2019-02-13 MED ORDER — DEXTROSE 50 % IV SOLN
INTRAVENOUS | Status: AC
  Administered 2019-02-13: 16:00:00 25 mL
  Filled 2019-02-13: qty 50
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
## 2019-02-13 NOTE — Care Management (Addendum)
CM spoke with pts son Marc [name] - [place] financial forms have not yet been picked up from [facility] nurses station.  Marc [name] requests that CM email form for completion - CM emailed form as requested  Update 1600:  Pt's son completed form as requested and emailed back to CM.  CM printed form to unit desk and requested unit secretary to fax to [phone] as requested by [place].

## 2019-02-13 NOTE — Progress Notes (Signed)
Most of pts NGT was hanging out of pts nare. RN pulled NGT out and reinserted a new one. Pt was educated on the purposed/use of NGT and why it is important not to mess with it. Pt verbalized understanding, but does toss and turn and is restless at times in bed. Abd xray obtained to confirm NGT placement. Will continue to monitor closely.  Caswell Corwin, RN 02/13/19 4:15 AM

## 2019-02-13 NOTE — Plan of Care (Signed)
  Problem: Education: Goal: Knowledge of General Education information will improve Description: Including pain rating scale, medication(s)/side effects and non-pharmacologic comfort measures Outcome: Progressing   Problem: Clinical Measurements: Goal: Will remain free from infection Outcome: Progressing Goal: Respiratory complications will improve Outcome: Progressing Goal: Cardiovascular complication will be avoided Outcome: Progressing   Problem: Activity: Goal: Risk for activity intolerance will decrease Outcome: Progressing   Problem: Nutrition: Goal: Adequate nutrition will be maintained Outcome: Progressing   Problem: Coping: Goal: Level of anxiety will decrease Outcome: Progressing   Problem: Elimination: Goal: Will not experience complications related to urinary retention Outcome: Progressing

## 2019-02-14 ENCOUNTER — Inpatient Hospital Stay (HOSPITAL_COMMUNITY): Payer: Medicare Other

## 2019-02-14 LAB — GLUCOSE, CAPILLARY
Glucose-Capillary: 106 mg/dL — ABNORMAL HIGH (ref 70–99)
Glucose-Capillary: 116 mg/dL — ABNORMAL HIGH (ref 70–99)
Glucose-Capillary: 144 mg/dL — ABNORMAL HIGH (ref 70–99)
Glucose-Capillary: 80 mg/dL (ref 70–99)
Glucose-Capillary: 92 mg/dL (ref 70–99)
Glucose-Capillary: 96 mg/dL (ref 70–99)

## 2019-02-14 MED ORDER — ORAL CARE MOUTH RINSE
15.0000 mL | Freq: Two times a day (BID) | OROMUCOSAL | Status: DC
  Administered 2019-02-15 – 2019-02-16 (×3): 15 mL via OROMUCOSAL

## 2019-02-14 MED ORDER — CHLORHEXIDINE GLUCONATE 0.12 % MT SOLN: 15.0000 mL | Freq: Two times a day (BID) | OROMUCOSAL | Status: DC

## 2019-02-14 MED ORDER — FOLIC ACID 1 MG PO TABS
1.0000 mg | ORAL_TABLET | Freq: Every day | ORAL | Status: DC
  Administered 2019-02-15: 1 mg via ORAL
  Filled 2019-02-14 (×2): qty 1

## 2019-02-14 MED ORDER — ZOLPIDEM TARTRATE 5 MG PO TABS: 5.0000 mg | ORAL_TABLET | Freq: Every evening | ORAL | Status: DC | PRN

## 2019-02-14 MED ORDER — SENNOSIDES 8.8 MG/5ML PO SYRP
5.0000 mL | ORAL_SOLUTION | Freq: Every day | ORAL | Status: DC
  Administered 2019-02-14 – 2019-02-19 (×2): 5 mL via ORAL
  Filled 2019-02-14 (×5): qty 5

## 2019-02-14 MED ORDER — POLYETHYLENE GLYCOL 3350 17 G PO PACK
17.0000 g | PACK | Freq: Every day | ORAL | Status: DC
  Administered 2019-02-15: 17 g via ORAL
  Filled 2019-02-14 (×2): qty 1

## 2019-02-14 MED ORDER — SODIUM CHLORIDE 0.9% FLUSH
10.0000 mL | Freq: Two times a day (BID) | INTRAVENOUS | Status: DC
  Administered 2019-02-14 – 2019-02-19 (×7): 10 mL
  Administered 2019-02-20: 20 mL
  Administered 2019-02-20 – 2019-03-03 (×17): 10 mL

## 2019-02-14 MED ORDER — GUAIFENESIN 100 MG/5ML PO SOLN
30.0000 mL | Freq: Two times a day (BID) | ORAL | Status: DC
  Administered 2019-02-14 – 2019-02-15 (×2): 600 mg via ORAL
  Filled 2019-02-14: qty 15
  Filled 2019-02-14: qty 30

## 2019-02-14 MED ORDER — RESOURCE THICKENUP CLEAR PO POWD
ORAL | Status: DC | PRN
  Filled 2019-02-14 (×3): qty 125

## 2019-02-14 MED ORDER — SENNOSIDES 8.8 MG/5ML PO SYRP: 5.0000 mL | ORAL_SOLUTION | Freq: Every day | ORAL | Status: DC

## 2019-02-14 MED ORDER — PANTOPRAZOLE SODIUM 40 MG PO PACK
40.0000 mg | PACK | Freq: Every day | ORAL | Status: DC
  Administered 2019-02-15: 40 mg via ORAL
  Filled 2019-02-14 (×3): qty 20

## 2019-02-14 MED ORDER — SODIUM CHLORIDE 0.9% FLUSH: 10.0000 mL | INTRAVENOUS | Status: DC | PRN
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
## 2019-02-14 NOTE — Progress Notes (Signed)
CSW faxed financial forms to [place] for review.  Edwin Dada, MSW, LCSW-A Transitions of Care  Clinical Social Worker  [facility] Emergency Departments  Medical ICU [phone]
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
## 2019-02-14 NOTE — Progress Notes (Signed)
Modified Barium Swallow Progress Note  Patient Details  Name: Marc [name] MRN: [id number] Date of Birth: 1955-10-12  Today's Date: 02/14/2019  Modified Barium Swallow completed.  Full report located under Chart Review in the Imaging Section.  Brief recommendations include the following:  Clinical Impression  MBS completed with PMV donned showing mild oropharyngeal dysphagia primarily pharyngeal phase. Laryngeal closure was late and barium reached pyriform sinuses entering laryngeal vestibule. Nectar thick spilled over epiglottis before the swallow x 1 and was penetrated during the swallow remaining on anterior wall. Thin barium entered vestibule close to vocal cords was not ejected. Pt was not sensate to penetrates. Vallecular and pyriform sinus residue mild with mild penetration. Verbal cues for cough were not consistently effective in clearing penetrates. Recommend Dys 1 (puree), honey thick liquids via TEASPOON only, donn PMV with all po's and full supervision.     Swallow Evaluation Recommendations       SLP Diet Recommendations: Dysphagia 1 (Puree) solids;Honey thick liquids   Liquid Administration via: Spoon   Medication Administration: Crushed with puree   Supervision: Full assist for feeding;Staff to assist with self feeding;Full supervision/cueing for compensatory strategies   Compensations: Slow rate;Small sips/bites;Minimize environmental distractions;Other (Comment)(donn valve with all po's)   Postural Changes: Seated upright at 90 degrees   Oral Care Recommendations: Oral care BID   Other Recommendations: Order thickener from pharmacy    Marc [name] 02/14/2019,1:53 PM   Marc [name].Ed Nurse, children's [phone] Office [phone]

## 2019-02-14 NOTE — Progress Notes (Signed)
Patient transferred from 64M, patient sleeping upon transfer. Patient SAT 84-86% respiratory called. Trach suctioned. Patient currently 98-100% oxygen SAT. Patient able to tolerate DYS 1 meal. Patient able to express needs and A&OX 2. Patient gave permission for Gwyneth Sprout to be informed about condition. He mouthed " he can know everything". Patient in bed resting

## 2019-02-15 ENCOUNTER — Inpatient Hospital Stay (HOSPITAL_COMMUNITY): Payer: Medicare Other

## 2019-02-15 LAB — COMPREHENSIVE METABOLIC PANEL
ALT: 6 U/L (ref 0–44)
AST: 10 U/L — ABNORMAL LOW (ref 15–41)
Albumin: 1.8 g/dL — ABNORMAL LOW (ref 3.5–5.0)
Alkaline Phosphatase: 103 U/L (ref 38–126)
Anion gap: 5 (ref 5–15)
BUN: 14 mg/dL (ref 8–23)
CO2: 40 mmol/L — ABNORMAL HIGH (ref 22–32)
Calcium: 8 mg/dL — ABNORMAL LOW (ref 8.9–10.3)
Chloride: 101 mmol/L (ref 98–111)
Creatinine, Ser: 0.63 mg/dL (ref 0.61–1.24)
GFR calc Af Amer: >60 mL/min (ref >60)
GFR calc non Af Amer: >60 mL/min (ref >60)
Glucose, Bld: 150 mg/dL — ABNORMAL HIGH (ref 70–99)
Potassium: 4 mmol/L (ref 3.5–5.1)
Sodium: 146 mmol/L — ABNORMAL HIGH (ref 135–145)
Total Bilirubin: 0.4 mg/dL (ref 0.3–1.2)
Total Protein: 5.4 g/dL — ABNORMAL LOW (ref 6.5–8.1)

## 2019-02-15 LAB — GLUCOSE, CAPILLARY
Glucose-Capillary: 115 mg/dL — ABNORMAL HIGH (ref 70–99)
Glucose-Capillary: 126 mg/dL — ABNORMAL HIGH (ref 70–99)
Glucose-Capillary: 139 mg/dL — ABNORMAL HIGH (ref 70–99)
Glucose-Capillary: 139 mg/dL — ABNORMAL HIGH (ref 70–99)
Glucose-Capillary: 140 mg/dL — ABNORMAL HIGH (ref 70–99)
Glucose-Capillary: 90 mg/dL (ref 70–99)

## 2019-02-15 LAB — CBC
HCT: 30.7 % — ABNORMAL LOW (ref 39.0–52.0)
Hemoglobin: 8.6 g/dL — ABNORMAL LOW (ref 13.0–17.0)
MCH: 25.2 pg — ABNORMAL LOW (ref 26.0–34.0)
MCHC: 28 g/dL — ABNORMAL LOW (ref 30.0–36.0)
MCV: 90 fL (ref 80.0–100.0)
Platelets: 238 10*3/uL (ref 150–400)
RBC: 3.41 MIL/uL — ABNORMAL LOW (ref 4.22–5.81)
RDW: 16.2 % — ABNORMAL HIGH (ref 11.5–15.5)
WBC: 6.3 10*3/uL (ref 4.0–10.5)
nRBC: 0 % (ref 0.0–0.2)

## 2019-02-15 LAB — MAGNESIUM: Magnesium: 2.1 mg/dL (ref 1.7–2.4)

## 2019-02-15 MED ORDER — ONDANSETRON HCL 4 MG/2ML IJ SOLN: 4.0000 mg | Freq: Four times a day (QID) | INTRAMUSCULAR | Status: DC | PRN
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
## 2019-02-16 DIAGNOSIS — F17210 Nicotine dependence, cigarettes, uncomplicated: Secondary | ICD-10-CM

## 2019-02-16 DIAGNOSIS — R1319 Other dysphagia: Secondary | ICD-10-CM

## 2019-02-16 LAB — POCT I-STAT 7, (LYTES, BLD GAS, ICA,H+H)
Acid-Base Excess: 15 mmol/L — ABNORMAL HIGH (ref 0.0–2.0)
Bicarbonate: 42.8 mmol/L — ABNORMAL HIGH (ref 20.0–28.0)
Calcium, Ion: 1.17 mmol/L (ref 1.15–1.40)
HCT: 29 % — ABNORMAL LOW (ref 39.0–52.0)
Hemoglobin: 9.9 g/dL — ABNORMAL LOW (ref 13.0–17.0)
O2 Saturation: 94 %
Patient temperature: 97.7
Potassium: 4.5 mmol/L (ref 3.5–5.1)
Sodium: 145 mmol/L (ref 135–145)
TCO2: 45 mmol/L — ABNORMAL HIGH (ref 22–32)
pCO2 arterial: 68.6 mmHg (ref 32.0–48.0)
pH, Arterial: 7.401 (ref 7.350–7.450)
pO2, Arterial: 74 mmHg — ABNORMAL LOW (ref 83.0–108.0)

## 2019-02-16 LAB — GLUCOSE, CAPILLARY
Glucose-Capillary: 104 mg/dL — ABNORMAL HIGH (ref 70–99)
Glucose-Capillary: 107 mg/dL — ABNORMAL HIGH (ref 70–99)
Glucose-Capillary: 114 mg/dL — ABNORMAL HIGH (ref 70–99)
Glucose-Capillary: 123 mg/dL — ABNORMAL HIGH (ref 70–99)
Glucose-Capillary: 179 mg/dL — ABNORMAL HIGH (ref 70–99)
Glucose-Capillary: 92 mg/dL (ref 70–99)

## 2019-02-16 MED ORDER — WHITE PETROLATUM EX OINT
TOPICAL_OINTMENT | CUTANEOUS | Status: AC
  Administered 2019-02-16: 0.2
  Filled 2019-02-16: qty 28.35
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
## 2019-02-16 NOTE — Progress Notes (Signed)
eLink Physician-Brief Progress Note Patient Name: Marc [name] DOB: 1955/03/02 MRN: [id number]   Date of Service  02/16/2019  HPI/Events of Note  Patient admitted to ICU from floor overnight after having an aspiration event in setting of vomiting that led to the patient developing substantial hypoxemia and agonal respirations, as well as obtundation (did not withdraw to pain).  On arrival to ICU he was placed on mechanical ventilation via his existing tracheostomy.   eICU Interventions  Agree with plan for CT Head in AM unless there is improvement in mental status.  Mechanical ventilation for now pending stabilization/improvement of hypoxemia following most recent aspiration event.  Continue ABX for MSSA bacteremia (cefazolin).      Intervention Category Evaluation Type: New Patient Evaluation  Janae Bridgeman 02/16/2019, 4:39 AM

## 2019-02-16 NOTE — Progress Notes (Signed)
Pt SpO2 decreasing to 87%, lung fields coarse, congestion audible. Pt initially responsive and following commands. SpO2 continued to decrease to high 70s/ low 80s. Thick tan secretions suctioned from trach, Patient became unresponsive with agonal breathing. RT and rapid response called. RRT notified MD of patient condition. See Rapid response notes. Patient transferred to ICU for higher level of care.

## 2019-02-16 NOTE — Significant Event (Signed)
Rapid Response Event Note  Overview: Called d/t unresponsiveness and SpO2 80% on .98 TC. Per RN, pt vomited 3 hours ago. At that time, vomit was suctioned out of trach. PCXR was obtained showing "Mildly increased interstitial markings throughout both lungs which could be due to interstitial edema or infectious etiology."  Time Called: 0039 Arrival Time: 0042 Event Type: Neurologic, Respiratory  Initial Focused Assessment: Pt laying in bed with agonal respirations, unresponsive to deep sternal rub. HR-81, BP-116/68, RR-23, SpO2-82% on .98 TC. Trach inner cannula changed. Thick tan secretions suctioned x 3 from trach. Pulses 3 and sluggish. Lungs coarse t/o. TRH NP, PCCM, and respiratory therapist notified. RT to beside and pt bagged-SpO2 increased to 97% and pt began to minimally respond.   Interventions: Trach inner cannula changed Trach suctioned Pt bagged Tx to 2M04 and placed on vent by RT.  Plan of Care (if not transferred): Tx to ICU Event Summary: Name of Physician Notified: Bodeheimer, NP at [phone]  Name of Consulting Physician Notified: Vernona Rieger, PCCM NP at [phone]  Outcome: Transferred (Comment)(2M04)  Event End Time: 0110  Terrilyn Saver

## 2019-02-16 NOTE — Progress Notes (Signed)
Pt neuro status returned to baseline after transfer to ICU, per Dr. Rande Lawman discontinuing head CT.

## 2019-02-16 NOTE — Progress Notes (Signed)
Pt found with emesis on gown and around trach collar. Pt actively vomiting brownish color emesis from trach. Trach suctioned, inner cannula changed.pt SpO2 low 80s. RT notified.RT increased fiO2 to 98%, patient able to maintain O2 sat of 92%. MD notified and placed stat orders for chest xray, pt diet changed to NPO.
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
## 2019-02-16 NOTE — Progress Notes (Signed)
Patient was transferred to the ICU team last night. 

## 2019-02-17 ENCOUNTER — Encounter (HOSPITAL_COMMUNITY): Payer: Self-pay | Admitting: Pulmonary Disease

## 2019-02-17 LAB — URINALYSIS, ROUTINE W REFLEX MICROSCOPIC
Bilirubin Urine: NEGATIVE
Glucose, UA: NEGATIVE mg/dL
Ketones, ur: 5 mg/dL — AB
Nitrite: NEGATIVE
Protein, ur: 100 mg/dL — AB
Specific Gravity, Urine: 1.014 (ref 1.005–1.030)
WBC, UA: >50 WBC/hpf — ABNORMAL HIGH (ref 0–5)
pH: 6 (ref 5.0–8.0)

## 2019-02-17 LAB — GLUCOSE, CAPILLARY
Glucose-Capillary: 104 mg/dL — ABNORMAL HIGH (ref 70–99)
Glucose-Capillary: 111 mg/dL — ABNORMAL HIGH (ref 70–99)
Glucose-Capillary: 135 mg/dL — ABNORMAL HIGH (ref 70–99)
Glucose-Capillary: 65 mg/dL — ABNORMAL LOW (ref 70–99)
Glucose-Capillary: 81 mg/dL (ref 70–99)
Glucose-Capillary: 85 mg/dL (ref 70–99)
Glucose-Capillary: 96 mg/dL (ref 70–99)
Glucose-Capillary: 98 mg/dL (ref 70–99)

## 2019-02-17 MED ORDER — DEXTROSE 50 % IV SOLN
INTRAVENOUS | Status: AC
  Administered 2019-02-17: 25 mL
  Filled 2019-02-17: qty 50

## 2019-02-17 MED ORDER — ORAL CARE MOUTH RINSE
15.0000 mL | Freq: Two times a day (BID) | OROMUCOSAL | Status: DC
  Administered 2019-02-17 – 2019-02-24 (×16): 15 mL via OROMUCOSAL

## 2019-02-17 MED ORDER — PANTOPRAZOLE SODIUM 40 MG IV SOLR
40.0000 mg | INTRAVENOUS | Status: DC
  Administered 2019-02-17 – 2019-02-18 (×2): 40 mg via INTRAVENOUS
  Filled 2019-02-17 (×2): qty 40

## 2019-02-17 MED ORDER — LORAZEPAM 2 MG/ML IJ SOLN
1.0000 mg | INTRAMUSCULAR | Status: DC | PRN
  Administered 2019-02-17 (×2): 2 mg via INTRAVENOUS
  Filled 2019-02-17 (×2): qty 1

## 2019-02-17 MED ORDER — GUAIFENESIN 100 MG/5ML PO SOLN
30.0000 mL | Freq: Four times a day (QID) | ORAL | Status: DC | PRN
  Administered 2019-03-01: 600 mg via ORAL
  Filled 2019-02-17 (×2): qty 30

## 2019-02-17 MED ORDER — GLYCOPYRROLATE 0.2 MG/ML IJ SOLN
0.2000 mg | Freq: Four times a day (QID) | INTRAMUSCULAR | Status: DC | PRN
  Administered 2019-02-17 – 2019-03-04 (×6): 0.2 mg via INTRAVENOUS
  Filled 2019-02-17 (×6): qty 1

## 2019-02-17 NOTE — Progress Notes (Signed)
Supplies given to RN for dsg change
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
## 2019-02-17 NOTE — Consult Note (Signed)
  WOC Nurse Consult Note: Patient receiving care at [facility] 2M04 Reason for Consult:Re-assessment of trach wound Wound type:Full thickness wound underneath faceplate measuring 0.3 cm x 1.2 cm x 0.2 cm.  Wound bed is red and moist with no odor present.  Patient continues to have a large amount of tan thick secretions expectorating from trach with a foul odor, making it difficult to keep surround skin dry and clean.  Apply a Drawtex trach dressing [facility] [place] [phone]) to trach change dressing daily and PRN for soilage.   Monitor the wound area's for worsening of condition such as, Signs/symptoms of infection, Increase in size, development of or worsening of odor, development of pain, or increased pain at the affected locations.  Notify the medical team if any of these develop.  Thank you for the consult.  Discussed plan of care with the patient.  WOC nurse will not follow at this time.  Please re-consult the WOC if needed.    Silvio Pate, RN, [facility]

## 2019-02-18 ENCOUNTER — Inpatient Hospital Stay (HOSPITAL_COMMUNITY): Payer: Medicare Other

## 2019-02-18 LAB — BASIC METABOLIC PANEL
Anion gap: 9 (ref 5–15)
BUN: 13 mg/dL (ref 8–23)
CO2: 38 mmol/L — ABNORMAL HIGH (ref 22–32)
Calcium: 8 mg/dL — ABNORMAL LOW (ref 8.9–10.3)
Chloride: 103 mmol/L (ref 98–111)
Creatinine, Ser: 0.79 mg/dL (ref 0.61–1.24)
GFR calc Af Amer: >60 mL/min (ref >60)
GFR calc non Af Amer: >60 mL/min (ref >60)
Glucose, Bld: 91 mg/dL (ref 70–99)
Potassium: 3.7 mmol/L (ref 3.5–5.1)
Sodium: 150 mmol/L — ABNORMAL HIGH (ref 135–145)

## 2019-02-18 LAB — GLUCOSE, CAPILLARY
Glucose-Capillary: 103 mg/dL — ABNORMAL HIGH (ref 70–99)
Glucose-Capillary: 126 mg/dL — ABNORMAL HIGH (ref 70–99)
Glucose-Capillary: 129 mg/dL — ABNORMAL HIGH (ref 70–99)
Glucose-Capillary: 132 mg/dL — ABNORMAL HIGH (ref 70–99)
Glucose-Capillary: 87 mg/dL (ref 70–99)
Glucose-Capillary: 90 mg/dL (ref 70–99)

## 2019-02-18 LAB — CBC
HCT: 30.9 % — ABNORMAL LOW (ref 39.0–52.0)
Hemoglobin: 8.8 g/dL — ABNORMAL LOW (ref 13.0–17.0)
MCH: 25.1 pg — ABNORMAL LOW (ref 26.0–34.0)
MCHC: 28.5 g/dL — ABNORMAL LOW (ref 30.0–36.0)
MCV: 88.3 fL (ref 80.0–100.0)
Platelets: 197 10*3/uL (ref 150–400)
RBC: 3.5 MIL/uL — ABNORMAL LOW (ref 4.22–5.81)
RDW: 16.1 % — ABNORMAL HIGH (ref 11.5–15.5)
WBC: 6.3 10*3/uL (ref 4.0–10.5)
nRBC: 0 % (ref 0.0–0.2)

## 2019-02-18 MED ORDER — PANTOPRAZOLE SODIUM 40 MG PO TBEC
40.0000 mg | DELAYED_RELEASE_TABLET | Freq: Every day | ORAL | Status: DC
  Administered 2019-02-19: 40 mg via ORAL
  Filled 2019-02-18: qty 1

## 2019-02-18 NOTE — Progress Notes (Signed)
Inpatient Diabetes Program Recommendations  AACE/ADA: New Consensus Statement on Inpatient Glycemic Control   Target Ranges:  Prepandial:   less than 140 mg/dL      Peak postprandial:   less than 180 mg/dL (1-2 hours)      Critically ill patients:  140 - 180 mg/dL   Results for ARMONDO, CECH (MRN [id number]) as of 02/18/2019 09:17  Ref. Range 02/17/2019 07:20 02/17/2019 11:38 02/17/2019 15:10 02/17/2019 19:10 02/17/2019 19:48 02/17/2019 23:05 02/18/2019 03:02 02/18/2019 07:08  Glucose-Capillary Latest Ref Range: 70 - 99 mg/dL 144 (H) 818 (H)  Novolog 2 units 81 65 (L) 111 (H) 85 90 103 (H)  Results for HODARI, CHUBA (MRN [id number]) as of 02/18/2019 09:17  Ref. Range 01/21/2019 22:36  Hemoglobin A1C Latest Ref Range: 4.8 - 5.6 % 6.3 (H)   Review of Glycemic Control  Diabetes history: DM2 Outpatient Diabetes medications: None Current orders for Inpatient glycemic control: Novolog 0-15 units Q4H  Inpatient Diabetes Program Recommendations:   Correction (SSI): May want to consider decreasing Novolog to 0-9 units Q4H.  Thanks, Orlando Penner, RN, MSN, CDE Diabetes Coordinator Inpatient Diabetes Program [phone] (Team Pager from 8am to 5pm)

## 2019-02-18 NOTE — Progress Notes (Signed)
Patient transferred to room 2 W 22 via bed from 22M.  Tolerated well.

## 2019-02-18 NOTE — Care Management (Signed)
CM text paged and left VM for Marquita Ila Mcgill CSW with the [place] requesting call back regarding status on SNF approval
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
## 2019-02-18 NOTE — Progress Notes (Signed)
Patient transferred to 2w22 via bed with monitor . He tolerated transfer well

## 2019-02-18 NOTE — Progress Notes (Signed)
Nutrition Follow-up  DOCUMENTATION CODES:   Not applicable  INTERVENTION:   Magic cup TID with meals, each supplement provides 290 kcal and 9 grams of protein  NUTRITION DIAGNOSIS:   Increased nutrient needs related to acute illness as evidenced by estimated needs.  Ongoing   GOAL:   Patient will meet greater than or equal to 90% of their needs  Progressing  MONITOR:   Diet advancement, PO intake, Supplement acceptance, Labs, Skin  REASON FOR ASSESSMENT:   Ventilator, Consult Enteral/tube feeding initiation and management  ASSESSMENT:   Patient with PMH significant for reported bipolar disease and DM. Presents this admission with acute encephalopathy from unclear etiology and severe septic shock.  Trach in place. No longer requiring vent support.  SLP evaluated patient 2/8 and recommended dysphagia 1 diet with honey thick liquids. Diet advanced to dysphagia 1 with thin liquids today. RN thickened liquids provided on lunch tray today to honey consistency. He ate 100% of lunch per RN. RD to clarify diet order to dysphagia 1 with honey thick liquids.  Weight trending down slowly, 95.9 kg today, 97.3 kg 1 week ago. Labs reviewed. Na 150 (H) CBGs: [phone] Medications reviewed.   Diet Order:   Diet Order            DIET - DYS 1 Room service appropriate? Yes; Fluid consistency: Thin  Diet effective now              EDUCATION NEEDS:   Not appropriate for education at this time  Skin:  Skin Assessment: Skin Integrity Issues: Skin Integrity Issues:: Other (Comment) Stage II: R hip healed wound Other: full thickness wound at trach site  Last BM:  2/8  Height:   Ht Readings from Last 1 Encounters:  02/16/19 6\' 2"  (1.88 m)    Weight:   Wt Readings from Last 1 Encounters:  02/18/19 95.9 kg    Ideal Body Weight:  86.4 kg  BMI:  Body mass index is 27.14 kg/m.  Estimated Nutritional Needs:   Kcal:  2200-2400  Protein:  115-135 gm  Fluid:  >/=  2.2 L    04/18/19, RD, LDN, CNSC Contact information can be found in Amion.

## 2019-02-19 ENCOUNTER — Inpatient Hospital Stay (HOSPITAL_COMMUNITY): Payer: Medicare Other

## 2019-02-19 LAB — CBC
HCT: 32.6 % — ABNORMAL LOW (ref 39.0–52.0)
Hemoglobin: 9.2 g/dL — ABNORMAL LOW (ref 13.0–17.0)
MCH: 25.1 pg — ABNORMAL LOW (ref 26.0–34.0)
MCHC: 28.2 g/dL — ABNORMAL LOW (ref 30.0–36.0)
MCV: 88.8 fL (ref 80.0–100.0)
Platelets: 209 10*3/uL (ref 150–400)
RBC: 3.67 MIL/uL — ABNORMAL LOW (ref 4.22–5.81)
RDW: 16.2 % — ABNORMAL HIGH (ref 11.5–15.5)
WBC: 6.3 10*3/uL (ref 4.0–10.5)
nRBC: 0 % (ref 0.0–0.2)

## 2019-02-19 LAB — BASIC METABOLIC PANEL
Anion gap: 8 (ref 5–15)
BUN: 12 mg/dL (ref 8–23)
CO2: 36 mmol/L — ABNORMAL HIGH (ref 22–32)
Calcium: 8 mg/dL — ABNORMAL LOW (ref 8.9–10.3)
Chloride: 107 mmol/L (ref 98–111)
Creatinine, Ser: 0.77 mg/dL (ref 0.61–1.24)
GFR calc Af Amer: >60 mL/min (ref >60)
GFR calc non Af Amer: >60 mL/min (ref >60)
Glucose, Bld: 119 mg/dL — ABNORMAL HIGH (ref 70–99)
Potassium: 4.3 mmol/L (ref 3.5–5.1)
Sodium: 151 mmol/L — ABNORMAL HIGH (ref 135–145)

## 2019-02-19 LAB — GLUCOSE, CAPILLARY
Glucose-Capillary: 98 mg/dL (ref 70–99)
Glucose-Capillary: 133 mg/dL — ABNORMAL HIGH (ref 70–99)
Glucose-Capillary: 114 mg/dL — ABNORMAL HIGH (ref 70–99)
Glucose-Capillary: 145 mg/dL — ABNORMAL HIGH (ref 70–99)
Glucose-Capillary: 171 mg/dL — ABNORMAL HIGH (ref 70–99)
Glucose-Capillary: 62 mg/dL — ABNORMAL LOW (ref 70–99)
Glucose-Capillary: 63 mg/dL — ABNORMAL LOW (ref 70–99)
Glucose-Capillary: 68 mg/dL — ABNORMAL LOW (ref 70–99)

## 2019-02-19 NOTE — Care Management (Signed)
CM received message that [facility] did not [facility] fax [facility]/5 nor 2/8.  CSW re-faxed financial documents to [place].  [facility] informed CSW that financial forms were not completed in entirety  CM reached out to pt's son [name] and requested completion of forms so they can be again faxed to [place].
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
## 2019-02-19 NOTE — Significant Event (Signed)
Rapid Response Event Note  Overview: Time Called: 1500 Arrival Time: 1501 Event Type: Respiratory  Initial Focused Assessment/Interventions: RN performing routine suctioning when the patient became unresponsive and bradycardic.  Reported O2 sats 40 but validated O2 sats 80s. Normally O2 sats 92-95% on   28% TC RN bagged via trach for a few minutes Patient became more responsive and then back to baseline neuro status. Placed on 100% via TC  HR:SR/JR 59-62, then ST 104 then settled at SR 80 RR 24 Weaned o2 to 40% TC O2 sats 94% BP 105/57  PCXR ordered     Plan of Care (if not transferred): Rn to call if patient becomes hypoxic again or has respiratory distress.  Event Summary: Name of Physician Notified: Hazeline Junker at 1505   Outcome: Stayed in room and stabalized  Event End Time: 1545  Marc [name]

## 2019-02-19 NOTE — Progress Notes (Signed)
Called to patient's bedside for unresponsiveness and bradycardia.    Received patient being manually ventilated.  Patient remains on 40% TC with saturation around 94%.  Patient is now  Responsive.  PRN suctioning done for copious loose tan secretions.  Sputum will be collected as ordered and sent to lab.  Trache care done.

## 2019-02-20 ENCOUNTER — Inpatient Hospital Stay (HOSPITAL_COMMUNITY): Payer: Medicare Other

## 2019-02-20 ENCOUNTER — Encounter (HOSPITAL_COMMUNITY): Payer: Self-pay | Admitting: Pulmonary Disease

## 2019-02-20 DIAGNOSIS — R0902 Hypoxemia: Secondary | ICD-10-CM

## 2019-02-20 LAB — GLUCOSE, CAPILLARY
Glucose-Capillary: 127 mg/dL — ABNORMAL HIGH (ref 70–99)
Glucose-Capillary: 70 mg/dL (ref 70–99)
Glucose-Capillary: 75 mg/dL (ref 70–99)
Glucose-Capillary: 76 mg/dL (ref 70–99)
Glucose-Capillary: 77 mg/dL (ref 70–99)
Glucose-Capillary: 83 mg/dL (ref 70–99)
Glucose-Capillary: 89 mg/dL (ref 70–99)

## 2019-02-20 LAB — CBC
HCT: 33.1 % — ABNORMAL LOW (ref 39.0–52.0)
Hemoglobin: 9.1 g/dL — ABNORMAL LOW (ref 13.0–17.0)
MCH: 25.5 pg — ABNORMAL LOW (ref 26.0–34.0)
MCHC: 27.5 g/dL — ABNORMAL LOW (ref 30.0–36.0)
MCV: 92.7 fL (ref 80.0–100.0)
Platelets: 202 10*3/uL (ref 150–400)
RBC: 3.57 MIL/uL — ABNORMAL LOW (ref 4.22–5.81)
RDW: 16.4 % — ABNORMAL HIGH (ref 11.5–15.5)
WBC: 6.6 10*3/uL (ref 4.0–10.5)
nRBC: 0.3 % — ABNORMAL HIGH (ref 0.0–0.2)

## 2019-02-20 LAB — BLOOD GAS, ARTERIAL
Acid-Base Excess: 12.9 mmol/L — ABNORMAL HIGH (ref 0.0–2.0)
Bicarbonate: 40.5 mmol/L — ABNORMAL HIGH (ref 20.0–28.0)
FIO2: 35
O2 Saturation: 86.8 %
Patient temperature: 37
pCO2 arterial: 97.8 mmHg (ref 32.0–48.0)
pH, Arterial: 7.241 — ABNORMAL LOW (ref 7.350–7.450)
pO2, Arterial: 57 mmHg — ABNORMAL LOW (ref 83.0–108.0)

## 2019-02-20 LAB — POCT I-STAT 7, (LYTES, BLD GAS, ICA,H+H)
Acid-Base Excess: 15 mmol/L — ABNORMAL HIGH (ref 0.0–2.0)
Bicarbonate: 40.9 mmol/L — ABNORMAL HIGH (ref 20.0–28.0)
Calcium, Ion: 1.15 mmol/L (ref 1.15–1.40)
HCT: 25 % — ABNORMAL LOW (ref 39.0–52.0)
Hemoglobin: 8.5 g/dL — ABNORMAL LOW (ref 13.0–17.0)
O2 Saturation: 94 %
Patient temperature: 98.4
Potassium: 3.8 mmol/L (ref 3.5–5.1)
Sodium: 150 mmol/L — ABNORMAL HIGH (ref 135–145)
TCO2: 43 mmol/L — ABNORMAL HIGH (ref 22–32)
pCO2 arterial: 62 mmHg — ABNORMAL HIGH (ref 32.0–48.0)
pH, Arterial: 7.427 (ref 7.350–7.450)
pO2, Arterial: 73 mmHg — ABNORMAL LOW (ref 83.0–108.0)

## 2019-02-20 LAB — COMPREHENSIVE METABOLIC PANEL
ALT: <5 U/L (ref 0–44)
AST: 13 U/L — ABNORMAL LOW (ref 15–41)
Albumin: 2.1 g/dL — ABNORMAL LOW (ref 3.5–5.0)
Alkaline Phosphatase: 111 U/L (ref 38–126)
Anion gap: 8 (ref 5–15)
BUN: 11 mg/dL (ref 8–23)
CO2: 37 mmol/L — ABNORMAL HIGH (ref 22–32)
Calcium: 8 mg/dL — ABNORMAL LOW (ref 8.9–10.3)
Chloride: 105 mmol/L (ref 98–111)
Creatinine, Ser: 0.84 mg/dL (ref 0.61–1.24)
GFR calc Af Amer: >60 mL/min (ref >60)
GFR calc non Af Amer: >60 mL/min (ref >60)
Glucose, Bld: 95 mg/dL (ref 70–99)
Potassium: 4.1 mmol/L (ref 3.5–5.1)
Sodium: 150 mmol/L — ABNORMAL HIGH (ref 135–145)
Total Bilirubin: 0.7 mg/dL (ref 0.3–1.2)
Total Protein: 6.4 g/dL — ABNORMAL LOW (ref 6.5–8.1)

## 2019-02-20 LAB — CULTURE, RESPIRATORY W GRAM STAIN

## 2019-02-20 MED ORDER — PNEUMOCOCCAL VAC POLYVALENT 25 MCG/0.5ML IJ INJ
0.5000 mL | INJECTION | INTRAMUSCULAR | Status: AC
  Administered 2019-02-21: 0.5 mL via INTRAMUSCULAR
  Filled 2019-02-20: qty 0.5

## 2019-02-20 MED ORDER — FUROSEMIDE 10 MG/ML IJ SOLN
40.0000 mg | Freq: Once | INTRAMUSCULAR | Status: AC
  Administered 2019-02-20: 40 mg via INTRAVENOUS
  Filled 2019-02-20: qty 4

## 2019-02-20 MED ORDER — INSULIN ASPART 100 UNIT/ML ~~LOC~~ SOLN
0.0000 [IU] | SUBCUTANEOUS | Status: DC
  Administered 2019-02-22 – 2019-03-01 (×10): 1 [IU] via SUBCUTANEOUS
  Administered 2019-03-02: 05:00:00 123 [IU] via SUBCUTANEOUS
  Administered 2019-03-04 – 2019-03-06 (×4): 1 [IU] via SUBCUTANEOUS

## 2019-02-20 NOTE — Progress Notes (Signed)
Inpatient Diabetes Program Recommendations  AACE/ADA: New Consensus Statement on Inpatient Glycemic Control (2015)  Target Ranges:  Prepandial:   less than 140 mg/dL      Peak postprandial:   less than 180 mg/dL (1-2 hours)      Critically ill patients:  140 - 180 mg/dL   Lab Results  Component Value Date   GLUCAP 76 02/20/2019   HGBA1C 6.3 (H) 01/21/2019    Review of Glycemic Control Results for GRADIE, [facility] (MRN [id number]) as of 02/20/2019 13:36  Ref. Range 02/19/2019 23:25 02/20/2019 00:28 02/20/2019 03:05 02/20/2019 07:37 02/20/2019 11:25  Glucose-Capillary Latest Ref Range: 70 - 99 mg/dL 62 (L) 70 89 001 (H) 76  Diabetes history: DM2 Outpatient Diabetes medications: None Current orders for Inpatient glycemic control: Novolog 0-15 units Q4H  Inpatient Diabetes Program Recommendations:   Consider reduction of Novolog correction to very sensitive 0-6 units q 4 hours.    Thanks  Beryl Meager, RN, BC-ADM Inpatient Diabetes Coordinator Pager [phone] (8a-5p)

## 2019-02-20 NOTE — Progress Notes (Signed)
Nutrition Follow-up  DOCUMENTATION CODES:   Not applicable  INTERVENTION:   If patient continues to require vent support and remains NPO, recommend placing Cortrak tube (service available Friday, 2/12) for enteral nutrition support. Vital AF 1.2 at 70 ml/h would provide 2016 kcal, 126 gm protein, 1362 ml free water daily.  NUTRITION DIAGNOSIS:   Increased nutrient needs related to acute illness as evidenced by estimated needs.  Ongoing   GOAL:   Patient will meet greater than or equal to 90% of their needs  Unmet  MONITOR:   Vent status, Diet advancement, PO intake, Labs, Skin  ASSESSMENT:   Patient with PMH significant for reported bipolar disease and DM. Presents this admission with acute encephalopathy from unclear etiology and severe septic shock.  Trach placed 1/23. Was not requiring vent support and was transferred out of the ICU 2/9. Patient transferred back to ICU 2/10 for vent support.   Currently NPO, diet had been advanced to Dysphagia 1 with honey thick liquids by SLP and patient was eating very well prior to returning to ICU for vent support.   Hopeful to get patient off vent support today so he can eat.   Weight continues to trend downward, 93.9 kg today, 96.6 kg 1 week ago. Labs reviewed. Na 150 (H) CBGs: 127-76 Medications reviewed.   Diet Order:   Diet Order    None      EDUCATION NEEDS:   Not appropriate for education at this time  Skin:  Skin Assessment: Skin Integrity Issues: Skin Integrity Issues:: Other (Comment) Stage II: R hip healed wound Other: full thickness wound at trach site  Last BM:  2/10  Height:   Ht Readings from Last 1 Encounters:  02/16/19 6\' 2"  (1.88 m)    Weight:   Wt Readings from Last 1 Encounters:  02/20/19 93.9 kg    Ideal Body Weight:  86.4 kg  BMI:  Body mass index is 26.58 kg/m.  Estimated Nutritional Needs:   Kcal:  2000-2300  Protein:  115-135 gm  Fluid:  >/= 2.2 L    04/20/19,  RD, LDN, CNSC Contact information can be found in Amion.

## 2019-02-20 NOTE — Progress Notes (Signed)
eLink Physician-Brief Progress Note Patient Name: Marc [name] DOB: 1956/01/03 MRN: [id number]   Date of Service  02/20/2019  HPI/Events of Note  Pt transferred to the ICU due to noticed apneic spells while sleeping, spells were accompanied by desaturation.  eICU Interventions  Ventilator orders entered and patient added to the list. PCCM daytime attending to see PT in a.m. and follow from a distance for ventilator management.        Thomasene Lot Ercia Crisafulli 02/20/2019, 3:05 AM

## 2019-02-20 NOTE — Progress Notes (Signed)
RT obtained ABG on pt with the following results without any changes at this time. RT will continue to monitor.   Results for DYLEN, MCELHANNON (MRN [id number]) as of 02/20/2019 04:18  Ref. Range 02/20/2019 04:10  Sample type Unknown ARTERIAL  pH, Arterial Latest Ref Range: 7.350 - 7.450  7.427  pCO2 arterial Latest Ref Range: 32.0 - 48.0 mmHg 62.0 (H)  pO2, Arterial Latest Ref Range: 83.0 - 108.0 mmHg 73.0 (L)  TCO2 Latest Ref Range: 22 - 32 mmol/L 43 (H)  Acid-Base Excess Latest Ref Range: 0.0 - 2.0 mmol/L 15.0 (H)  Bicarbonate Latest Ref Range: 20.0 - 28.0 mmol/L 40.9 (H)  O2 Saturation Latest Units: % 94.0  Patient temperature Unknown 98.4 F  Collection site Unknown RADIAL, ALLEN'S TEST ACCEPTABLE

## 2019-02-20 NOTE — Progress Notes (Signed)
Called by NP to assist with transfer of pt from 2W22 to 2M05 for vent support d/t abnormal ABG results, unresponsiveness, apnea, and bradycardia. Pt transferred with RT, RR, and Engineer, drilling. Once in 2M05, pt was placed back on vent.
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
## 2019-02-20 NOTE — Progress Notes (Addendum)
Shift event: RN paged that when pt goes to sleep he has shallow breathing with periods of apnea and desats to the 70s. RT came and suctioned pt, increased O2 per trach collar to 8L. ABG was ordered which showed a pH of 7.2, PCO2 97, PO2 of 57. NP to bedside. Pt sleeping on his side. Arouses with nurse encouragement. He is able to tell me the month and where he is.  In concern over his ABG and periods of apnea, NP called PCCM. Pt had a period of hypoxia, unresponsiveness and bradycardia around 4pm on 02/19/19. Spoke to Dr. Raeford Razor in [place]. We will move pt back to ICU to be placed on vent support. NP feels this pt likely needs vent support every night.  KJKG, NP [facility] Addendum: r/p ABG much improved since vent.  KJKG, NP [facility]
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
## 2019-02-21 LAB — CBC
HCT: 29.6 % — ABNORMAL LOW (ref 39.0–52.0)
Hemoglobin: 8.6 g/dL — ABNORMAL LOW (ref 13.0–17.0)
MCH: 25.3 pg — ABNORMAL LOW (ref 26.0–34.0)
MCHC: 29.1 g/dL — ABNORMAL LOW (ref 30.0–36.0)
MCV: 87.1 fL (ref 80.0–100.0)
Platelets: 201 10*3/uL (ref 150–400)
RBC: 3.4 MIL/uL — ABNORMAL LOW (ref 4.22–5.81)
RDW: 15.9 % — ABNORMAL HIGH (ref 11.5–15.5)
WBC: 6.2 10*3/uL (ref 4.0–10.5)
nRBC: 0 % (ref 0.0–0.2)

## 2019-02-21 LAB — COMPREHENSIVE METABOLIC PANEL
ALT: <5 U/L (ref 0–44)
AST: 11 U/L — ABNORMAL LOW (ref 15–41)
Albumin: 2 g/dL — ABNORMAL LOW (ref 3.5–5.0)
Alkaline Phosphatase: 98 U/L (ref 38–126)
Anion gap: 11 (ref 5–15)
BUN: 10 mg/dL (ref 8–23)
CO2: 37 mmol/L — ABNORMAL HIGH (ref 22–32)
Calcium: 8.1 mg/dL — ABNORMAL LOW (ref 8.9–10.3)
Chloride: 106 mmol/L (ref 98–111)
Creatinine, Ser: 0.79 mg/dL (ref 0.61–1.24)
GFR calc Af Amer: >60 mL/min (ref >60)
GFR calc non Af Amer: >60 mL/min (ref >60)
Glucose, Bld: 89 mg/dL (ref 70–99)
Potassium: 3.5 mmol/L (ref 3.5–5.1)
Sodium: 154 mmol/L — ABNORMAL HIGH (ref 135–145)
Total Bilirubin: 0.8 mg/dL (ref 0.3–1.2)
Total Protein: 6 g/dL — ABNORMAL LOW (ref 6.5–8.1)

## 2019-02-21 LAB — GLUCOSE, CAPILLARY
Glucose-Capillary: 101 mg/dL — ABNORMAL HIGH (ref 70–99)
Glucose-Capillary: 121 mg/dL — ABNORMAL HIGH (ref 70–99)
Glucose-Capillary: 149 mg/dL — ABNORMAL HIGH (ref 70–99)
Glucose-Capillary: 66 mg/dL — ABNORMAL LOW (ref 70–99)
Glucose-Capillary: 74 mg/dL (ref 70–99)
Glucose-Capillary: 80 mg/dL (ref 70–99)
Glucose-Capillary: 99 mg/dL (ref 70–99)

## 2019-02-21 MED ORDER — DEXTROSE 50 % IV SOLN
12.5000 g | INTRAVENOUS | Status: AC
  Administered 2019-02-21: 12.5 g via INTRAVENOUS
  Filled 2019-02-21: qty 50

## 2019-02-21 MED ORDER — PANTOPRAZOLE SODIUM 40 MG PO PACK
40.0000 mg | PACK | Freq: Every day | ORAL | Status: DC
  Administered 2019-02-21 – 2019-04-13 (×50): 40 mg
  Filled 2019-02-21 (×52): qty 20

## 2019-02-21 MED ORDER — NICOTINE 21 MG/24HR TD PT24
21.0000 mg | MEDICATED_PATCH | Freq: Every day | TRANSDERMAL | Status: DC
  Administered 2019-02-21 – 2019-03-04 (×13): 21 mg via TRANSDERMAL
  Filled 2019-02-21 (×13): qty 1

## 2019-02-21 MED ORDER — SENNOSIDES 8.8 MG/5ML PO SYRP
5.0000 mL | ORAL_SOLUTION | Freq: Every day | ORAL | Status: DC
  Administered 2019-02-21 – 2019-03-22 (×18): 5 mL
  Filled 2019-02-21 (×21): qty 5

## 2019-02-21 MED ORDER — POLYETHYLENE GLYCOL 3350 17 G PO PACK
17.0000 g | PACK | Freq: Every day | ORAL | Status: DC
  Administered 2019-02-21 – 2019-02-25 (×5): 17 g
  Filled 2019-02-21 (×5): qty 1

## 2019-02-21 MED ORDER — FOLIC ACID 1 MG PO TABS
1.0000 mg | ORAL_TABLET | Freq: Every day | ORAL | Status: DC
  Administered 2019-02-21 – 2019-04-17 (×55): 1 mg
  Filled 2019-02-21 (×56): qty 1

## 2019-02-21 MED ORDER — VITAL AF 1.2 CAL PO LIQD
1000.0000 mL | ORAL | Status: DC
  Administered 2019-02-21 – 2019-02-26 (×4): 1000 mL
  Filled 2019-02-21 (×2): qty 1000

## 2019-02-21 MED ORDER — FREE WATER
200.0000 mL | Freq: Three times a day (TID) | Status: DC
  Administered 2019-02-21 – 2019-02-23 (×6): 200 mL
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
## 2019-02-21 NOTE — Progress Notes (Signed)
CSW received completed [employer] application from patient's son [name]. CSW sent application to [place], [place] Child psychotherapist.  Edwin Dada, MSW, LCSW-A Transitions of Care  Clinical Social Worker  [facility]  Medical ICU [phone]
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
## 2019-02-21 NOTE — Progress Notes (Signed)
SLP Cancellation Note  Patient Details Name: ZOLLIE ELLERY MRN: [id number] DOB: 08/01/1955   Cancelled treatment:       Reason Eval/Treat Not Completed: Medical issues which prohibited therapy(Pt is currently on vent. SLP will follow up. )  Khelani Kops I. Vear Clock, MS, CCC-SLP Acute Rehabilitation Services Office number [phone] Pager [phone]  Scheryl Marten 02/21/2019, 9:52 AM

## 2019-02-21 NOTE — Progress Notes (Signed)
SLP Cancellation Note  Patient Details Name: Marc [name] MRN: [id number] DOB: Jul 11, 1955   Cancelled treatment:       Reason Eval/Treat Not Completed: Other (comment). Addressed request from MD to determine if pt needs a Cortrak. Reported to RN that since pt had a significant dysphagia even prior to needing ventilation, would not recommend him as a candidate to eat and drink on the ventilator. Cortrak appropriate today. Will follow closely for ATC and readiness for PO trials.   Harlon Ditty, MA CCC-SLP  Acute Rehabilitation Services Pager [phone] Office [phone]  Claudine Mouton 02/21/2019, 10:43 AM

## 2019-02-21 NOTE — Progress Notes (Signed)
Assisted with tele visit for family.

## 2019-02-21 NOTE — Progress Notes (Signed)
eLink Physician-Brief Progress Note Patient Name: Marc [name] DOB: Apr 15, 1955 MRN: [id number]   Date of Service  02/21/2019  HPI/Events of Note  Patient asking for a cigarette.   eICU Interventions  Will order: 1. Nicotine patch 21 mg to skin now and Q day.      Intervention Category Major Interventions: Other:  [name] 02/21/2019, 12:44 AM

## 2019-02-21 NOTE — Progress Notes (Signed)
Nutrition Follow-up  DOCUMENTATION CODES:   Not applicable  INTERVENTION:   Begin tube feeding after Cortrak tube placed today:  Vital AF 1.2 at 70 ml/h to provide 2016 kcal, 126 gm protein, 1362 ml free water daily.  NUTRITION DIAGNOSIS:   Increased nutrient needs related to acute illness as evidenced by estimated needs.  Ongoing   GOAL:   Patient will meet greater than or equal to 90% of their needs  Unmet  MONITOR:   Vent status, Diet advancement, PO intake, Labs, Skin  ASSESSMENT:   Patient with PMH significant for reported bipolar disease and DM. Presents this admission with acute encephalopathy from unclear etiology and severe septic shock.  Trach placed 1/23. Remains on vent support today.  MV: 8.6 L/min Temp (24hrs), Avg:97.7 F (36.5 C), Min:97.6 F (36.4 C), Max:97.9 F (36.6 C)   Currently NPO. Patient is not a candidate for taking POs while on ventilator per SLP.   Cortrak has been ordered for feeding and medications.  Weight continues to trend downward, 91.4 kg today, 93.9 kg 2/11, 96.6 kg 1 week ago.  Labs reviewed. Na 154 (H) CBGs: 74-80-66 Medications reviewed.   Diet Order:   Diet Order    None      EDUCATION NEEDS:   Not appropriate for education at this time  Skin:  Skin Assessment: Skin Integrity Issues: Skin Integrity Issues:: Other (Comment) Stage II: R hip healed wound Other: full thickness wound at trach site  Last BM:  2/10  Height:   Ht Readings from Last 1 Encounters:  02/16/19 6\' 2"  (1.88 m)    Weight:   Wt Readings from Last 1 Encounters:  02/21/19 91.4 kg    Ideal Body Weight:  86.4 kg  BMI:  Body mass index is 25.87 kg/m.  Estimated Nutritional Needs:   Kcal:  2000-2300  Protein:  115-135 gm  Fluid:  >/= 2.2 L    04/21/19, RD, LDN, CNSC Contact information can be found in Amion.
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
## 2019-02-21 NOTE — Procedures (Signed)
Cortrak  Tube Type:  Cortrak - 43 inches Tube Location:  Right nare Initial Placement:  Stomach Secured by: Bridle Technique Used to Measure Tube Placement:  Documented cm marking at nare/ corner of mouth Cortrak Secured At:  65 cm    Cortrak Tube Team Note:  Consult received to place a Cortrak feeding tube.   No x-ray is required. RN may begin using tube.   If the tube becomes dislodged please keep the tube and contact the Cortrak team at [email or web address] (password [email or web address]) for replacement.  If after hours and replacement cannot be delayed, place a NG tube and confirm placement with an abdominal x-ray.    Betsey Holiday MS, RD, LDN Contact information available in Amion

## 2019-02-22 LAB — CBC
HCT: 30.7 % — ABNORMAL LOW (ref 39.0–52.0)
Hemoglobin: 9 g/dL — ABNORMAL LOW (ref 13.0–17.0)
MCH: 25.2 pg — ABNORMAL LOW (ref 26.0–34.0)
MCHC: 29.3 g/dL — ABNORMAL LOW (ref 30.0–36.0)
MCV: 86 fL (ref 80.0–100.0)
Platelets: 206 K/uL (ref 150–400)
RBC: 3.57 MIL/uL — ABNORMAL LOW (ref 4.22–5.81)
RDW: 15.9 % — ABNORMAL HIGH (ref 11.5–15.5)
WBC: 6.1 K/uL (ref 4.0–10.5)
nRBC: 0.3 % — ABNORMAL HIGH (ref 0.0–0.2)

## 2019-02-22 LAB — GLUCOSE, CAPILLARY
Glucose-Capillary: 125 mg/dL — ABNORMAL HIGH (ref 70–99)
Glucose-Capillary: 150 mg/dL — ABNORMAL HIGH (ref 70–99)
Glucose-Capillary: 114 mg/dL — ABNORMAL HIGH (ref 70–99)
Glucose-Capillary: 125 mg/dL — ABNORMAL HIGH (ref 70–99)
Glucose-Capillary: 112 mg/dL — ABNORMAL HIGH (ref 70–99)
Glucose-Capillary: 126 mg/dL — ABNORMAL HIGH (ref 70–99)

## 2019-02-22 LAB — COMPREHENSIVE METABOLIC PANEL WITH GFR
ALT: <5 U/L (ref 0–44)
AST: 11 U/L — ABNORMAL LOW (ref 15–41)
Albumin: 2.1 g/dL — ABNORMAL LOW (ref 3.5–5.0)
Alkaline Phosphatase: 97 U/L (ref 38–126)
Anion gap: 7 (ref 5–15)
BUN: 13 mg/dL (ref 8–23)
CO2: 35 mmol/L — ABNORMAL HIGH (ref 22–32)
Calcium: 7.8 mg/dL — ABNORMAL LOW (ref 8.9–10.3)
Chloride: 106 mmol/L (ref 98–111)
Creatinine, Ser: 0.72 mg/dL (ref 0.61–1.24)
GFR calc Af Amer: >60 mL/min
GFR calc non Af Amer: >60 mL/min
Glucose, Bld: 137 mg/dL — ABNORMAL HIGH (ref 70–99)
Potassium: 3.2 mmol/L — ABNORMAL LOW (ref 3.5–5.1)
Sodium: 148 mmol/L — ABNORMAL HIGH (ref 135–145)
Total Bilirubin: 0.4 mg/dL (ref 0.3–1.2)
Total Protein: 5.9 g/dL — ABNORMAL LOW (ref 6.5–8.1)

## 2019-02-22 LAB — PHOSPHORUS: Phosphorus: 2.3 mg/dL — ABNORMAL LOW (ref 2.5–4.6)

## 2019-02-22 LAB — MAGNESIUM: Magnesium: 1.8 mg/dL (ref 1.7–2.4)

## 2019-02-22 MED ORDER — POTASSIUM CHLORIDE 10 MEQ/100ML IV SOLN
10.0000 meq | INTRAVENOUS | Status: AC
  Administered 2019-02-22 (×2): 10 meq via INTRAVENOUS
  Filled 2019-02-22 (×2): qty 100

## 2019-02-22 NOTE — Plan of Care (Signed)
POC discussed with pt. Nods appropriately, intermittent confusion. No c/o pain, remains on PC 40% fi02. Copious secretions. Tolerating well. In NSR, afebrile. No BM this shift, tolerating tube feeds. Foley catheter in place, adequate urine output, turned left/right q2, dressing placed around trach to help prevent further skin breakdown. No other issues noted.

## 2019-02-22 NOTE — Progress Notes (Signed)
SLP Cancellation Note  Patient Details Name: Marc [name] MRN: [id number] DOB: 1955/01/18   Cancelled treatment:       Reason Eval/Treat Not Completed: Medical issues which prohibited therapy.  Pt is currently on the vent.  SLP will f/u as appropriate.    Shanon Rosser Daren Doswell 02/22/2019, 8:50 AM

## 2019-02-22 NOTE — Progress Notes (Signed)
Pt has tried multiple times to reach over side rail to pop side rail down, pulled of 3 condom caths, just very agitated and restless, PRN med attempted, unsuccessful. Provider notified, Sitter placed bedside for safety.
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
## 2019-02-23 LAB — CULTURE, RESPIRATORY W GRAM STAIN

## 2019-02-23 LAB — GLUCOSE, CAPILLARY
Glucose-Capillary: 103 mg/dL — ABNORMAL HIGH (ref 70–99)
Glucose-Capillary: 114 mg/dL — ABNORMAL HIGH (ref 70–99)
Glucose-Capillary: 123 mg/dL — ABNORMAL HIGH (ref 70–99)
Glucose-Capillary: 125 mg/dL — ABNORMAL HIGH (ref 70–99)
Glucose-Capillary: 88 mg/dL (ref 70–99)

## 2019-02-23 LAB — COMPREHENSIVE METABOLIC PANEL
ALT: <5 U/L (ref 0–44)
AST: 8 U/L — ABNORMAL LOW (ref 15–41)
Albumin: 2 g/dL — ABNORMAL LOW (ref 3.5–5.0)
Alkaline Phosphatase: 97 U/L (ref 38–126)
Anion gap: 9 (ref 5–15)
BUN: 13 mg/dL (ref 8–23)
CO2: 30 mmol/L (ref 22–32)
Calcium: 8.2 mg/dL — ABNORMAL LOW (ref 8.9–10.3)
Chloride: 111 mmol/L (ref 98–111)
Creatinine, Ser: 0.77 mg/dL (ref 0.61–1.24)
GFR calc Af Amer: >60 mL/min (ref >60)
GFR calc non Af Amer: >60 mL/min (ref >60)
Glucose, Bld: 124 mg/dL — ABNORMAL HIGH (ref 70–99)
Potassium: 3.6 mmol/L (ref 3.5–5.1)
Sodium: 150 mmol/L — ABNORMAL HIGH (ref 135–145)
Total Bilirubin: 0.5 mg/dL (ref 0.3–1.2)
Total Protein: 6 g/dL — ABNORMAL LOW (ref 6.5–8.1)

## 2019-02-23 MED ORDER — CEFAZOLIN SODIUM-DEXTROSE 2-4 GM/100ML-% IV SOLN
2.0000 g | Freq: Three times a day (TID) | INTRAVENOUS | Status: DC
  Administered 2019-02-23 – 2019-03-03 (×23): 2 g via INTRAVENOUS
  Filled 2019-02-23 (×25): qty 100

## 2019-02-23 MED ORDER — POTASSIUM PHOSPHATES 15 MMOLE/5ML IV SOLN
20.0000 mmol | Freq: Once | INTRAVENOUS | Status: AC
  Administered 2019-02-23: 20 mmol via INTRAVENOUS
  Filled 2019-02-23: qty 6.67

## 2019-02-23 MED ORDER — FREE WATER
200.0000 mL | Status: DC
  Administered 2019-02-23 – 2019-02-28 (×30): 200 mL
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
## 2019-02-23 NOTE — Plan of Care (Signed)
POC discussed with pt. Intermittently agitated, easily reoriented and consoled. No c/o pain. Afebrile in NSR, remains on PC 40% Fi02. Thick secretions. Condom cath in place, trouble keeping it on. No BM this shift. Still tolerating tube feeds. Pt rolls left/right in bed. Multiple attempts to reach over side rail (see previous note) provider notified, Safety sitter bedside, tolerating well, no other issues noted at this time.

## 2019-02-23 NOTE — Progress Notes (Signed)
SLP Cancellation Note  Patient Details Name: IZAAN KINGBIRD MRN: [id number] DOB: 15-Aug-1955   Cancelled treatment:        Pt is medically not ready for PMV/Speech language evaluation. Pt remains on ventilator requiring 40% O2 with heavy secretions today. Will check back at next date.    Luis Abed., MA, CCC-SLP 02/23/2019, 11:02 AM

## 2019-02-24 ENCOUNTER — Inpatient Hospital Stay (HOSPITAL_COMMUNITY): Payer: Medicare Other

## 2019-02-24 LAB — COMPREHENSIVE METABOLIC PANEL
ALT: 5 U/L (ref 0–44)
AST: 13 U/L — ABNORMAL LOW (ref 15–41)
Albumin: 2.1 g/dL — ABNORMAL LOW (ref 3.5–5.0)
Alkaline Phosphatase: 89 U/L (ref 38–126)
Anion gap: 10 (ref 5–15)
BUN: 16 mg/dL (ref 8–23)
CO2: 29 mmol/L (ref 22–32)
Calcium: 7.9 mg/dL — ABNORMAL LOW (ref 8.9–10.3)
Chloride: 108 mmol/L (ref 98–111)
Creatinine, Ser: 0.71 mg/dL (ref 0.61–1.24)
GFR calc Af Amer: >60 mL/min (ref >60)
GFR calc non Af Amer: >60 mL/min (ref >60)
Glucose, Bld: 106 mg/dL — ABNORMAL HIGH (ref 70–99)
Potassium: 4.1 mmol/L (ref 3.5–5.1)
Sodium: 147 mmol/L — ABNORMAL HIGH (ref 135–145)
Total Bilirubin: 0.6 mg/dL (ref 0.3–1.2)
Total Protein: 6 g/dL — ABNORMAL LOW (ref 6.5–8.1)

## 2019-02-24 LAB — GLUCOSE, CAPILLARY
Glucose-Capillary: 103 mg/dL — ABNORMAL HIGH (ref 70–99)
Glucose-Capillary: 106 mg/dL — ABNORMAL HIGH (ref 70–99)
Glucose-Capillary: 107 mg/dL — ABNORMAL HIGH (ref 70–99)
Glucose-Capillary: 113 mg/dL — ABNORMAL HIGH (ref 70–99)
Glucose-Capillary: 117 mg/dL — ABNORMAL HIGH (ref 70–99)
Glucose-Capillary: 83 mg/dL (ref 70–99)

## 2019-02-24 MED ORDER — QUETIAPINE FUMARATE 50 MG PO TABS
25.0000 mg | ORAL_TABLET | Freq: Every day | ORAL | Status: DC
  Administered 2019-02-24 – 2019-02-25 (×2): 25 mg
  Filled 2019-02-24 (×2): qty 1

## 2019-02-24 MED ORDER — QUETIAPINE FUMARATE 50 MG PO TABS: 25.0000 mg | ORAL_TABLET | Freq: Every day | ORAL | Status: DC

## 2019-02-24 NOTE — Progress Notes (Signed)
SLP Cancellation Note  Patient Details Name: Marc [name] MRN: [id number] DOB: 10-25-1955   Cancelled treatment:       Reason Eval/Treat Not Completed: Medical issues which prohibited therapy(Per medical record, pt remains on the vent on this time with potential for him to try trach collar today. SLP will follow up.)  Yedidya Duddy I. Vear Clock, MS, CCC-SLP Acute Rehabilitation Services Office number [phone] Pager [phone]  Scheryl Marten 02/24/2019, 8:06 AM
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
## 2019-02-24 NOTE — Plan of Care (Signed)
Sitter at bedside all night. Pt did much better this evening. Not fidgety or trying to climb OOB. Re evaluate the need for sitter. Follows commands and nods/mouths words appropriately. On vent over night PC @ 40%. Did well. Try trach collar today? In NSR, generalized non pitting edema. 1 small BM this shift, condom cath removed and brief is in place. 2-3 urine episodes. Able to turn self in bed, no other issued noted at this time.
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
## 2019-02-25 LAB — COMPREHENSIVE METABOLIC PANEL
ALT: <5 U/L (ref 0–44)
AST: 14 U/L — ABNORMAL LOW (ref 15–41)
Albumin: 2.1 g/dL — ABNORMAL LOW (ref 3.5–5.0)
Alkaline Phosphatase: 91 U/L (ref 38–126)
Anion gap: 11 (ref 5–15)
BUN: 15 mg/dL (ref 8–23)
CO2: 27 mmol/L (ref 22–32)
Calcium: 8.1 mg/dL — ABNORMAL LOW (ref 8.9–10.3)
Chloride: 105 mmol/L (ref 98–111)
Creatinine, Ser: 0.71 mg/dL (ref 0.61–1.24)
GFR calc Af Amer: >60 mL/min (ref >60)
GFR calc non Af Amer: >60 mL/min (ref >60)
Glucose, Bld: 114 mg/dL — ABNORMAL HIGH (ref 70–99)
Potassium: 3.8 mmol/L (ref 3.5–5.1)
Sodium: 143 mmol/L (ref 135–145)
Total Bilirubin: 0.5 mg/dL (ref 0.3–1.2)
Total Protein: 5.8 g/dL — ABNORMAL LOW (ref 6.5–8.1)

## 2019-02-25 LAB — GLUCOSE, CAPILLARY
Glucose-Capillary: 100 mg/dL — ABNORMAL HIGH (ref 70–99)
Glucose-Capillary: 102 mg/dL — ABNORMAL HIGH (ref 70–99)
Glucose-Capillary: 123 mg/dL — ABNORMAL HIGH (ref 70–99)
Glucose-Capillary: 123 mg/dL — ABNORMAL HIGH (ref 70–99)
Glucose-Capillary: 96 mg/dL (ref 70–99)
Glucose-Capillary: 98 mg/dL (ref 70–99)

## 2019-02-25 MED ORDER — POLYETHYLENE GLYCOL 3350 17 G PO PACK
17.0000 g | PACK | Freq: Two times a day (BID) | ORAL | Status: DC
  Administered 2019-02-25 – 2019-02-27 (×4): 17 g
  Filled 2019-02-25 (×5): qty 1

## 2019-02-25 MED ORDER — LACTULOSE 10 GM/15ML PO SOLN
20.0000 g | Freq: Every day | ORAL | Status: DC | PRN
  Administered 2019-02-25: 20 g via ORAL

## 2019-02-25 MED ORDER — ORAL CARE MOUTH RINSE
15.0000 mL | OROMUCOSAL | Status: DC
  Administered 2019-02-25 – 2019-02-27 (×25): 15 mL via OROMUCOSAL

## 2019-02-25 MED ORDER — HYDROXYZINE HCL 10 MG PO TABS: 10.0000 mg | ORAL_TABLET | Freq: Three times a day (TID) | ORAL | Status: DC | PRN

## 2019-02-25 MED ORDER — HYDROXYZINE HCL 10 MG PO TABS
20.0000 mg | ORAL_TABLET | Freq: Three times a day (TID) | ORAL | Status: DC | PRN
  Administered 2019-02-25 – 2019-03-01 (×4): 20 mg via ORAL
  Filled 2019-02-25 (×6): qty 2

## 2019-02-25 MED ORDER — OLANZAPINE 5 MG PO TABS
5.0000 mg | ORAL_TABLET | Freq: Two times a day (BID) | ORAL | Status: DC | PRN
  Administered 2019-02-25 – 2019-03-01 (×2): 5 mg via ORAL
  Filled 2019-02-25 (×4): qty 1
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
## 2019-02-25 NOTE — Progress Notes (Signed)
Safety sitter order expired. On call [facility] hospitalist contacted. Hospitalist claimed CCM is primary and will need to renew this order. E-link contacted. Awaiting orders. Will continue to monitor.

## 2019-02-25 NOTE — Progress Notes (Signed)
Hospitalist notified pt receiving Fentanyl 100 mg every hour in addition to other PRN meds.This nurse asked hospitalist if pt could get continuous medication. Awaiting response. Will continue to monitor.

## 2019-02-25 NOTE — Progress Notes (Signed)
eLink Physician-Brief Progress Note Patient Name: Marc [name] DOB: [birth year] MRN: [id number]   Date of Service  02/25/2019  HPI/Events of Note  Request for renewal of sitter order  eICU Interventions  Patient seen calm at this time with sitter at bedside Ordered 1 on 1 sitter renewed     Intervention Category Minor Interventions: Agitation / anxiety - evaluation and management  Darl Pikes 02/25/2019, 3:05 AM

## 2019-02-25 NOTE — Progress Notes (Signed)
Trach changes will be done every 4 weeks per Dr. Wynona Neat. Janina Mayo change will be due March 2nd.

## 2019-02-26 LAB — COMPREHENSIVE METABOLIC PANEL
ALT: <5 U/L (ref 0–44)
AST: 11 U/L — ABNORMAL LOW (ref 15–41)
Albumin: 2 g/dL — ABNORMAL LOW (ref 3.5–5.0)
Alkaline Phosphatase: 85 U/L (ref 38–126)
Anion gap: 10 (ref 5–15)
BUN: 14 mg/dL (ref 8–23)
CO2: 26 mmol/L (ref 22–32)
Calcium: 8 mg/dL — ABNORMAL LOW (ref 8.9–10.3)
Chloride: 105 mmol/L (ref 98–111)
Creatinine, Ser: 0.62 mg/dL (ref 0.61–1.24)
GFR calc Af Amer: >60 mL/min (ref >60)
GFR calc non Af Amer: >60 mL/min (ref >60)
Glucose, Bld: 90 mg/dL (ref 70–99)
Potassium: 3.7 mmol/L (ref 3.5–5.1)
Sodium: 141 mmol/L (ref 135–145)
Total Bilirubin: 0.6 mg/dL (ref 0.3–1.2)
Total Protein: 5.6 g/dL — ABNORMAL LOW (ref 6.5–8.1)

## 2019-02-26 LAB — GLUCOSE, CAPILLARY
Glucose-Capillary: 75 mg/dL (ref 70–99)
Glucose-Capillary: 124 mg/dL — ABNORMAL HIGH (ref 70–99)
Glucose-Capillary: 96 mg/dL (ref 70–99)
Glucose-Capillary: 104 mg/dL — ABNORMAL HIGH (ref 70–99)
Glucose-Capillary: 91 mg/dL (ref 70–99)
Glucose-Capillary: 119 mg/dL — ABNORMAL HIGH (ref 70–99)

## 2019-02-26 MED ORDER — DEXMEDETOMIDINE HCL IN NACL 400 MCG/100ML IV SOLN
0.4000 ug/kg/h | INTRAVENOUS | Status: DC
  Administered 2019-02-26: 0.4 ug/kg/h via INTRAVENOUS
  Filled 2019-02-26: qty 100

## 2019-02-26 MED ORDER — MIDODRINE HCL 5 MG PO TABS
5.0000 mg | ORAL_TABLET | Freq: Three times a day (TID) | ORAL | Status: DC
  Administered 2019-02-26 – 2019-03-11 (×35): 5 mg via ORAL
  Filled 2019-02-26 (×37): qty 1

## 2019-02-26 MED ORDER — HALOPERIDOL LACTATE 5 MG/ML IJ SOLN
5.0000 mg | Freq: Once | INTRAMUSCULAR | Status: AC
  Administered 2019-02-26: 5 mg via INTRAVENOUS
  Filled 2019-02-26: qty 1

## 2019-02-26 MED ORDER — SODIUM CHLORIDE 0.9 % IV BOLUS
1000.0000 mL | Freq: Once | INTRAVENOUS | Status: AC
  Administered 2019-02-26: 1000 mL via INTRAVENOUS

## 2019-02-26 MED ORDER — QUETIAPINE FUMARATE 25 MG PO TABS
25.0000 mg | ORAL_TABLET | Freq: Two times a day (BID) | ORAL | Status: DC
  Administered 2019-02-27 – 2019-03-04 (×11): 25 mg
  Filled 2019-02-26 (×11): qty 1
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
## 2019-02-26 NOTE — Progress Notes (Signed)
eLink Physician-Brief Progress Note Patient Name: Marc [name] DOB: Dec 14, 1955 MRN: [id number]   Date of Service  02/26/2019  HPI/Events of Note  Pt with agitated delirium.  eICU Interventions  Precedex ordered        [name] 02/26/2019, 1:25 AM
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
## 2019-02-26 NOTE — Progress Notes (Signed)
Notified Dr. Pola Corn re continued SBP 70-90 MAPs remain 60 or greater. Updated on patients increased somnolence. Held [place].  Instrucetd to continue to monitor patient at this time.  No new orders received.  Aline August, RN
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
## 2019-02-27 LAB — GLUCOSE, CAPILLARY
Glucose-Capillary: 123 mg/dL — ABNORMAL HIGH (ref 70–99)
Glucose-Capillary: 98 mg/dL (ref 70–99)
Glucose-Capillary: 92 mg/dL (ref 70–99)
Glucose-Capillary: 119 mg/dL — ABNORMAL HIGH (ref 70–99)
Glucose-Capillary: 93 mg/dL (ref 70–99)
Glucose-Capillary: 110 mg/dL — ABNORMAL HIGH (ref 70–99)

## 2019-02-27 MED ORDER — VITAL AF 1.2 CAL PO LIQD
1000.0000 mL | ORAL | Status: DC
  Administered 2019-02-27 – 2019-03-03 (×7): 1000 mL
  Filled 2019-02-27 (×4): qty 1000

## 2019-02-27 NOTE — Progress Notes (Signed)
Nutrition Follow-up  DOCUMENTATION CODES:   Not applicable  INTERVENTION:   Continue tube feeding via Cortrak:   Increase Vital AF 1.2 to 80 ml/h to provide 2304 kcal, 144 gm protein, 1557 ml free water daily.  NUTRITION DIAGNOSIS:   Increased nutrient needs related to acute illness as evidenced by estimated needs.  Ongoing   GOAL:   Patient will meet greater than or equal to 90% of their needs  Met with TF  MONITOR:   Vent status, Diet advancement, PO intake, Labs, Skin  ASSESSMENT:   Patient with PMH significant for reported bipolar disease and DM. Presents this admission with acute encephalopathy from unclear etiology and severe septic shock.  Trach placed 1/23. Currently on trach collar.   Cortrak placed 2/12, tip is in the stomach. Receiving Vital AF 1.2 at 70 ml/h, tolerating well.  Free water flushes 200 ml every 4 hours.  SLP following for PMV and swallowing. Remains NPO for now. He is being allowed PRN ice chips with full supervision.   Weight has stabilized. Currently 91.3 kg.  Labs reviewed.  CBGs: [id number] Medications reviewed.  IVF: NS at 10 ml/h  Diet Order:   Diet Order    None      EDUCATION NEEDS:   Not appropriate for education at this time  Skin:  Skin Assessment: Skin Integrity Issues: Skin Integrity Issues:: Other (Comment) Stage II: R hip healed wound Other: full thickness wound at trach site  Last BM:  2/17  Height:   Ht Readings from Last 1 Encounters:  02/16/19 6' 2"  (1.88 m)    Weight:   Wt Readings from Last 1 Encounters:  02/27/19 91.3 kg    Ideal Body Weight:  86.4 kg  BMI:  Body mass index is 25.84 kg/m.  Estimated Nutritional Needs:   Kcal:  2200-2400  Protein:  125-145 gm  Fluid:  >/= 2.2 L    Molli Barrows, RD, LDN, CNSC Contact information can be found in Amion.
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
## 2019-02-28 LAB — CBC WITH DIFFERENTIAL/PLATELET
Abs Immature Granulocytes: 0.01 10*3/uL (ref 0.00–0.07)
Basophils Absolute: 0 10*3/uL (ref 0.0–0.1)
Basophils Relative: 0 %
Eosinophils Absolute: 0.3 10*3/uL (ref 0.0–0.5)
Eosinophils Relative: 6 %
HCT: 32.3 % — ABNORMAL LOW (ref 39.0–52.0)
Hemoglobin: 9.3 g/dL — ABNORMAL LOW (ref 13.0–17.0)
Immature Granulocytes: 0 %
Lymphocytes Relative: 23 %
Lymphs Abs: 1.2 10*3/uL (ref 0.7–4.0)
MCH: 25.1 pg — ABNORMAL LOW (ref 26.0–34.0)
MCHC: 28.8 g/dL — ABNORMAL LOW (ref 30.0–36.0)
MCV: 87.3 fL (ref 80.0–100.0)
Monocytes Absolute: 0.6 10*3/uL (ref 0.1–1.0)
Monocytes Relative: 11 %
Neutro Abs: 3.1 10*3/uL (ref 1.7–7.7)
Neutrophils Relative %: 60 %
Platelets: 202 10*3/uL (ref 150–400)
RBC: 3.7 MIL/uL — ABNORMAL LOW (ref 4.22–5.81)
RDW: 17.1 % — ABNORMAL HIGH (ref 11.5–15.5)
WBC: 5.2 10*3/uL (ref 4.0–10.5)
nRBC: 0 % (ref 0.0–0.2)

## 2019-02-28 LAB — BASIC METABOLIC PANEL
Anion gap: 10 (ref 5–15)
BUN: 14 mg/dL (ref 8–23)
CO2: 30 mmol/L (ref 22–32)
Calcium: 8.4 mg/dL — ABNORMAL LOW (ref 8.9–10.3)
Chloride: 104 mmol/L (ref 98–111)
Creatinine, Ser: 0.59 mg/dL — ABNORMAL LOW (ref 0.61–1.24)
GFR calc Af Amer: >60 mL/min (ref >60)
GFR calc non Af Amer: >60 mL/min (ref >60)
Glucose, Bld: 102 mg/dL — ABNORMAL HIGH (ref 70–99)
Potassium: 4.4 mmol/L (ref 3.5–5.1)
Sodium: 144 mmol/L (ref 135–145)

## 2019-02-28 LAB — GLUCOSE, CAPILLARY
Glucose-Capillary: 104 mg/dL — ABNORMAL HIGH (ref 70–99)
Glucose-Capillary: 110 mg/dL — ABNORMAL HIGH (ref 70–99)
Glucose-Capillary: 113 mg/dL — ABNORMAL HIGH (ref 70–99)
Glucose-Capillary: 117 mg/dL — ABNORMAL HIGH (ref 70–99)
Glucose-Capillary: 86 mg/dL (ref 70–99)
Glucose-Capillary: 97 mg/dL (ref 70–99)

## 2019-02-28 MED ORDER — ORAL CARE MOUTH RINSE
15.0000 mL | Freq: Two times a day (BID) | OROMUCOSAL | Status: DC
  Administered 2019-02-28 – 2019-03-04 (×10): 15 mL via OROMUCOSAL

## 2019-02-28 MED ORDER — FREE WATER
300.0000 mL | Status: DC
  Administered 2019-02-28 – 2019-03-03 (×16): 300 mL

## 2019-02-28 MED ORDER — SCOPOLAMINE 1 MG/3DAYS TD PT72
1.0000 | MEDICATED_PATCH | TRANSDERMAL | Status: DC
  Administered 2019-02-28 – 2019-03-03 (×2): 1.5 mg via TRANSDERMAL
  Filled 2019-02-28 (×4): qty 1
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
## 2019-02-28 NOTE — Progress Notes (Signed)
Patient currently on trach collar, tolerating well. Vitals are stable, no periods of apnea or desaturations. Patient has moderate amount of secretions with requires frequent trach care.  RT will continue to monitor patient.
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
## 2019-03-01 ENCOUNTER — Inpatient Hospital Stay (HOSPITAL_COMMUNITY): Payer: Medicare Other

## 2019-03-01 LAB — GLUCOSE, CAPILLARY
Glucose-Capillary: 109 mg/dL — ABNORMAL HIGH (ref 70–99)
Glucose-Capillary: 113 mg/dL — ABNORMAL HIGH (ref 70–99)
Glucose-Capillary: 125 mg/dL — ABNORMAL HIGH (ref 70–99)
Glucose-Capillary: 127 mg/dL — ABNORMAL HIGH (ref 70–99)
Glucose-Capillary: 138 mg/dL — ABNORMAL HIGH (ref 70–99)
Glucose-Capillary: 88 mg/dL (ref 70–99)

## 2019-03-01 MED ORDER — IPRATROPIUM-ALBUTEROL 0.5-2.5 (3) MG/3ML IN SOLN
3.0000 mL | Freq: Four times a day (QID) | RESPIRATORY_TRACT | Status: DC
  Administered 2019-03-01 – 2019-03-02 (×6): 3 mL via RESPIRATORY_TRACT
  Filled 2019-03-01 (×6): qty 3
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
## 2019-03-02 LAB — GLUCOSE, CAPILLARY
Glucose-Capillary: 123 mg/dL — ABNORMAL HIGH (ref 70–99)
Glucose-Capillary: 103 mg/dL — ABNORMAL HIGH (ref 70–99)
Glucose-Capillary: 99 mg/dL (ref 70–99)
Glucose-Capillary: 109 mg/dL — ABNORMAL HIGH (ref 70–99)

## 2019-03-02 MED ORDER — IPRATROPIUM-ALBUTEROL 0.5-2.5 (3) MG/3ML IN SOLN: 3.0000 mL | RESPIRATORY_TRACT | Status: DC | PRN

## 2019-03-02 NOTE — Progress Notes (Signed)
Temp reassessed 97.5 axillary.
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
## 2019-03-02 NOTE — Progress Notes (Signed)
Patient transferred to 6E30 via bed. Report given to Amy, RN. Only belongings patients has is his clothing and this was transferred with him.  Midline in LUE is saline locked. Trach collar hooked up to wall oxygen at room air, 28%. No sitter present so patient still has lap belt in place. SCD's on BLE. Cortrak patent and tube feeds hanging, vital 1.2@80 . Patient lying in bed comfortable. Primary nurse, Amy, RN, at bedside.

## 2019-03-03 ENCOUNTER — Inpatient Hospital Stay (HOSPITAL_COMMUNITY): Payer: Medicare Other

## 2019-03-03 LAB — CBC WITH DIFFERENTIAL/PLATELET
Abs Immature Granulocytes: 0.02 10*3/uL (ref 0.00–0.07)
Basophils Absolute: 0 10*3/uL (ref 0.0–0.1)
Basophils Relative: 0 %
Eosinophils Absolute: 0.3 10*3/uL (ref 0.0–0.5)
Eosinophils Relative: 6 %
HCT: 30.7 % — ABNORMAL LOW (ref 39.0–52.0)
Hemoglobin: 8.7 g/dL — ABNORMAL LOW (ref 13.0–17.0)
Immature Granulocytes: 0 %
Lymphocytes Relative: 24 %
Lymphs Abs: 1.4 10*3/uL (ref 0.7–4.0)
MCH: 25.4 pg — ABNORMAL LOW (ref 26.0–34.0)
MCHC: 28.3 g/dL — ABNORMAL LOW (ref 30.0–36.0)
MCV: 89.5 fL (ref 80.0–100.0)
Monocytes Absolute: 0.8 10*3/uL (ref 0.1–1.0)
Monocytes Relative: 13 %
Neutro Abs: 3.3 10*3/uL (ref 1.7–7.7)
Neutrophils Relative %: 57 %
Platelets: 251 10*3/uL (ref 150–400)
RBC: 3.43 MIL/uL — ABNORMAL LOW (ref 4.22–5.81)
RDW: 17.3 % — ABNORMAL HIGH (ref 11.5–15.5)
WBC: 5.8 10*3/uL (ref 4.0–10.5)
nRBC: 0 % (ref 0.0–0.2)

## 2019-03-03 LAB — BASIC METABOLIC PANEL
Anion gap: 12 (ref 5–15)
BUN: 15 mg/dL (ref 8–23)
CO2: 33 mmol/L — ABNORMAL HIGH (ref 22–32)
Calcium: 8.2 mg/dL — ABNORMAL LOW (ref 8.9–10.3)
Chloride: 99 mmol/L (ref 98–111)
Creatinine, Ser: 0.55 mg/dL — ABNORMAL LOW (ref 0.61–1.24)
GFR calc Af Amer: >60 mL/min (ref >60)
GFR calc non Af Amer: >60 mL/min (ref >60)
Glucose, Bld: 97 mg/dL (ref 70–99)
Potassium: 4.4 mmol/L (ref 3.5–5.1)
Sodium: 144 mmol/L (ref 135–145)

## 2019-03-03 LAB — GLUCOSE, CAPILLARY
Glucose-Capillary: 124 mg/dL — ABNORMAL HIGH (ref 70–99)
Glucose-Capillary: 108 mg/dL — ABNORMAL HIGH (ref 70–99)
Glucose-Capillary: 75 mg/dL (ref 70–99)
Glucose-Capillary: 98 mg/dL (ref 70–99)
Glucose-Capillary: 112 mg/dL — ABNORMAL HIGH (ref 70–99)
Glucose-Capillary: 118 mg/dL — ABNORMAL HIGH (ref 70–99)

## 2019-03-03 MED ORDER — SODIUM CHLORIDE 0.9% FLUSH
10.0000 mL | INTRAVENOUS | Status: DC | PRN
  Administered 2019-03-26 (×2): 10 mL

## 2019-03-03 MED ORDER — CEFAZOLIN SODIUM-DEXTROSE 2-4 GM/100ML-% IV SOLN
2.0000 g | Freq: Three times a day (TID) | INTRAVENOUS | Status: DC
  Administered 2019-03-03 – 2019-03-10 (×20): 2 g via INTRAVENOUS
  Filled 2019-03-03 (×22): qty 100

## 2019-03-03 MED ORDER — FREE WATER
100.0000 mL | Freq: Three times a day (TID) | Status: DC
  Administered 2019-03-03 – 2019-03-04 (×3): 100 mL

## 2019-03-03 MED ORDER — FUROSEMIDE 10 MG/ML IJ SOLN
40.0000 mg | Freq: Once | INTRAMUSCULAR | Status: AC
  Administered 2019-03-03: 40 mg via INTRAVENOUS
  Filled 2019-03-03: qty 4

## 2019-03-03 MED ORDER — SODIUM CHLORIDE 0.9% FLUSH
10.0000 mL | Freq: Two times a day (BID) | INTRAVENOUS | Status: DC
  Administered 2019-03-03 – 2019-04-17 (×80): 10 mL
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
## 2019-03-03 NOTE — Care Management (Signed)
03-03-19 1135 Case Manager received call from the [facility]- patient is affiliated with the [facility]- PCP is Reita May. Clinical Social Worker is Otho Perl pager [phone] and office # is [phone] ext [phone] if the patient has any community needs via the [place]. Case Manager will continue to follow. Gala Lewandowsky, RN,BSN Case Manager

## 2019-03-03 NOTE — Progress Notes (Signed)
Pt O2 76% and pt became bradycardic HR in the 40's. Increase FiO2 40%, suctioned and changed inner cannula. Notified Rapid Response, Respiratory and [facility] on call ([place]).

## 2019-03-03 NOTE — Progress Notes (Signed)
Called to bedside due to pt desat. Upon arrival RN was suctioning and pt had been increased to 35% FIO2 on ATC. RT increased patient to 98% ATC and SPo2 rose to 99%. Concern for aspiration. RT suctioned again and obtained moderate amount of small clear/yellow thin. Attending was notified and Chest xray was ordered. RT placed back on 40% and pt maintaining Spo2 of 94%. Pt not in any distress. Will await further orders.

## 2019-03-03 NOTE — Progress Notes (Signed)
Midline site assessment: Assessed midline, flushes easily, blood return noted. Site is just proximal to [facility]. Pt has sitter, requested she remind patient to straighten arm whenever the pump flashes a yellow light. Lauren, RN made aware.

## 2019-03-03 NOTE — Progress Notes (Signed)
Made pharmacy aware of the Ancef being administered late due to issues with pt's midline. Pharmacist stated he would move 1400 dose to later. Will continue to monitor.

## 2019-03-03 NOTE — Progress Notes (Signed)
Pt scheduled for MBS today at 12:30 given that MD started diet over the weekend but with questionable tolerance. Recommend hold any PO until MBS complete today.   Harlon Ditty, MA CCC-SLP  Acute Rehabilitation Services Pager [phone] Office [phone]

## 2019-03-03 NOTE — Significant Event (Signed)
Rapid Response Event Note  Called d/t pts SpO2 79% and heart rate 47. RT called to bedside as well.  Overview: Time Called: 0501 Arrival Time: 0510 Event Type: Respiratory  Initial Focused Assessment:  PTA RRT pt suctioned and inner cannula changed and FiO2 increased to 98% on trach collar. On arrival pt has copious amounts of tan secretions coming from trach despite just being suctioned, rhonchi through out, respiratory pattern normal, pt responds appropriately but sleepy.  HR 109, RR 22, BP 127/64, SpO2 100%.   Interventions: CXR- Slightly improved lung volumes but with some increasing opacity in the lung bases which could reflect worsening airspace disease or Edema. Robinul given   Plan of Care (if not transferred): Give PRN Robinul Q6h for increased secretions, replace Scopolamine patch. Pt is on Dysphagia honey thick diet, and on TF at 80. Recommending pt follow up with SLP d/t hx of aspiration. Pt FiO2 weaned down to 40 %, SpO2 94% and not in distress. Please call if further assistance with RRT is needed  Event Summary: Name of Physician Notified: Audrea Muscat at [phone]  Outcome: Stayed in room and stabalized  Event End Time: 0600  Keatin Benham P Woff[place]

## 2019-03-03 NOTE — Progress Notes (Signed)
Paged Adhikari, MD concerning tube feeds stopped by night shift and pt kept NPO d/t oxygen saturation dropping last night. Adhikari, MD stated to restart tube feedings at 71ml/hr and have speech therapy assess patient today. Will continue to monitor.

## 2019-03-03 NOTE — Progress Notes (Addendum)
Modified Barium Swallow Progress Note  Patient Details  Name: Marc [name] MRN: [id number] Date of Birth: May 01, 1955  Today's Date: 03/03/2019  Modified Barium Swallow completed.  Full report located under Chart Review in the Imaging Section.  Brief recommendations include the following:  Clinical Impression  Pt continues to demonstrate a severe oropharyngeal dysphagia with primary sensory impairment. Pt tolerated cuff deflation and pmsv placement without difficulty, but has impulsive behavior with PO when allowed to self feed; his oral phase is severely impaired with decreased bolus cohesion and inefficient lingual rocking for transit. Boluses spill to phaynx rather than being actively transited and depending on size or consistency the bolus can reach the valleculae, pyriforms or spill directly to the trachea with a prolonged duration prior to swallow initiation. There is no sensation of aspiration even with very large quantity aspiration events. With nectar thick liquids, even with teaspoon quantity and chin dipped forward there was aspiration before the swallow. Pt able to drink larger quantities of honey thick liquids without aspiration, but risk is still present and silent aspiration events are anticipated. Pt requires close supervision and assistance. May benefit from alternate method of nutrition while careful controlled feeding and dysphagia rehabiltition continues, but can also take puree and teaspoon sips of honey thick liquids with PMSV in place. Pt has potential to tolerate upgraded solids if he is observed to masticate small quantities at a slow rate.    Swallow Evaluation Recommendations       SLP Diet Recommendations: Dysphagia 1 (Puree) solids;Honey thick liquids   Liquid Administration via: Spoon   Medication Administration: Crushed with puree   Supervision: Full assist for feeding;Staff to assist with self feeding;Full supervision/cueing for compensatory strategies   Compensations: Slow rate;Small sips/bites;Minimize environmental distractions       Oral Care Recommendations: Oral care QID   Other Recommendations: Place PMSV during PO intake;Have oral suction available    Joshual Terrio, Riley Nearing 03/03/2019,2:38 PM
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
## 2019-03-04 ENCOUNTER — Inpatient Hospital Stay (HOSPITAL_COMMUNITY): Payer: Medicare Other

## 2019-03-04 LAB — CBC WITH DIFFERENTIAL/PLATELET
Abs Immature Granulocytes: 0.01 10*3/uL (ref 0.00–0.07)
Basophils Absolute: 0 10*3/uL (ref 0.0–0.1)
Basophils Relative: 0 %
Eosinophils Absolute: 0.3 10*3/uL (ref 0.0–0.5)
Eosinophils Relative: 7 %
HCT: 30.6 % — ABNORMAL LOW (ref 39.0–52.0)
Hemoglobin: 8.7 g/dL — ABNORMAL LOW (ref 13.0–17.0)
Immature Granulocytes: 0 %
Lymphocytes Relative: 26 %
Lymphs Abs: 1.2 10*3/uL (ref 0.7–4.0)
MCH: 25.6 pg — ABNORMAL LOW (ref 26.0–34.0)
MCHC: 28.4 g/dL — ABNORMAL LOW (ref 30.0–36.0)
MCV: 90 fL (ref 80.0–100.0)
Monocytes Absolute: 0.5 10*3/uL (ref 0.1–1.0)
Monocytes Relative: 11 %
Neutro Abs: 2.5 10*3/uL (ref 1.7–7.7)
Neutrophils Relative %: 56 %
Platelets: 238 10*3/uL (ref 150–400)
RBC: 3.4 MIL/uL — ABNORMAL LOW (ref 4.22–5.81)
RDW: 16.8 % — ABNORMAL HIGH (ref 11.5–15.5)
WBC: 4.6 10*3/uL (ref 4.0–10.5)
nRBC: 0 % (ref 0.0–0.2)

## 2019-03-04 LAB — BASIC METABOLIC PANEL
Anion gap: 9 (ref 5–15)
BUN: 12 mg/dL (ref 8–23)
CO2: 38 mmol/L — ABNORMAL HIGH (ref 22–32)
Calcium: 8.8 mg/dL — ABNORMAL LOW (ref 8.9–10.3)
Chloride: 95 mmol/L — ABNORMAL LOW (ref 98–111)
Creatinine, Ser: 0.52 mg/dL — ABNORMAL LOW (ref 0.61–1.24)
GFR calc Af Amer: >60 mL/min (ref >60)
GFR calc non Af Amer: >60 mL/min (ref >60)
Glucose, Bld: 146 mg/dL — ABNORMAL HIGH (ref 70–99)
Potassium: 4 mmol/L (ref 3.5–5.1)
Sodium: 142 mmol/L (ref 135–145)

## 2019-03-04 LAB — BLOOD GAS, ARTERIAL
Acid-Base Excess: 14.2 mmol/L — ABNORMAL HIGH (ref 0.0–2.0)
Bicarbonate: 40.2 mmol/L — ABNORMAL HIGH (ref 20.0–28.0)
FIO2: 35
O2 Saturation: 94.1 %
Patient temperature: 37
pCO2 arterial: 71.9 mmHg (ref 32.0–48.0)
pH, Arterial: 7.366 (ref 7.350–7.450)
pO2, Arterial: 69.4 mmHg — ABNORMAL LOW (ref 83.0–108.0)

## 2019-03-04 LAB — LACTIC ACID, PLASMA: Lactic Acid, Venous: 0.7 mmol/L (ref 0.5–1.9)

## 2019-03-04 LAB — GLUCOSE, CAPILLARY
Glucose-Capillary: 104 mg/dL — ABNORMAL HIGH (ref 70–99)
Glucose-Capillary: 109 mg/dL — ABNORMAL HIGH (ref 70–99)
Glucose-Capillary: 112 mg/dL — ABNORMAL HIGH (ref 70–99)
Glucose-Capillary: 115 mg/dL — ABNORMAL HIGH (ref 70–99)
Glucose-Capillary: 123 mg/dL — ABNORMAL HIGH (ref 70–99)
Glucose-Capillary: 126 mg/dL — ABNORMAL HIGH (ref 70–99)
Glucose-Capillary: 127 mg/dL — ABNORMAL HIGH (ref 70–99)
Glucose-Capillary: 77 mg/dL (ref 70–99)

## 2019-03-04 LAB — PROCALCITONIN: Procalcitonin: <0.1 ng/mL

## 2019-03-04 MED ORDER — LACTATED RINGERS IV BOLUS
500.0000 mL | Freq: Once | INTRAVENOUS | Status: AC
  Administered 2019-03-04: 500 mL via INTRAVENOUS

## 2019-03-04 MED ORDER — ORAL CARE MOUTH RINSE
15.0000 mL | OROMUCOSAL | Status: DC
  Administered 2019-03-04 – 2019-04-17 (×334): 15 mL via OROMUCOSAL

## 2019-03-04 MED ORDER — SODIUM CHLORIDE 0.9 % IV BOLUS: 500.0000 mL | Freq: Once | INTRAVENOUS | Status: DC | PRN

## 2019-03-04 MED ORDER — FREE WATER
200.0000 mL | Status: DC
  Administered 2019-03-04 – 2019-04-17 (×249): 200 mL

## 2019-03-04 MED ORDER — VITAL 1.5 CAL PO LIQD
1000.0000 mL | ORAL | Status: DC
  Administered 2019-03-04 – 2019-03-11 (×7): 1000 mL
  Filled 2019-03-04 (×17): qty 1000

## 2019-03-04 MED ORDER — PRO-STAT SUGAR FREE PO LIQD
30.0000 mL | Freq: Three times a day (TID) | ORAL | Status: DC
  Administered 2019-03-04 – 2019-04-17 (×123): 30 mL
  Filled 2019-03-04 (×122): qty 30
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
## 2019-03-04 NOTE — Progress Notes (Signed)
PT Cancellation Note  Patient Details Name: [name] MRN: [id number] DOB: [birth year]   Cancelled Treatment:    Reason Eval/Treat Not Completed: Medical issues which prohibited therapy per RN patient has been hypothermic, back on Bair hugger and per chart review now bradycardic. Holding for today, will follow acutely.    Madelaine Etienne, DPT, PN1   Supplemental Physical Therapist [facility]    Pager [phone] Acute Rehab Office [phone]

## 2019-03-04 NOTE — Progress Notes (Addendum)
Nutrition Follow-up  RD working remotely.  DOCUMENTATION CODES:   Not applicable  INTERVENTION:   -Once feeding access is established, initiate:  Initiate Vital 1.5 @ 55 ml/hr via cortrak tube  30 ml Prostat TID.    200 ml free water flush every 4 hours  Tube feeding regimen provides 2280 kcal (100% of needs), 134 grams of protein, and 1008 ml of H2O. Total free water: 2208 ml  NUTRITION DIAGNOSIS:   Increased nutrient needs related to acute illness as evidenced by estimated needs.  Ongoing  GOAL:   Patient will meet greater than or equal to 90% of their needs  Progressing   MONITOR:   PO intake, Supplement acceptance, Diet advancement, Labs, Weight trends, Skin, I & O's  REASON FOR ASSESSMENT:   Ventilator, Consult Enteral/tube feeding initiation and management  ASSESSMENT:   Patient with PMH significant for reported bipolar disease and DM. Presents this admission with acute encephalopathy from unclear etiology and severe septic shock.  1/23- trach placed 2/12- cortrak tube placed, tip of tube confirmed in stomach 2/21- trasnferred from ICU to PCU 2/22- s/p BSE- advanced to dysphagia 1 diet with honey thick liquids 2/23- cortrak d/c per MD, NPO, plan to replace cortrak  Reviewed I/O's: -1 L x 24 hours and +23.3 L since 02/18/19  UOP: 1.2 L x 24 hours  Dysphagia 1 diet with honey thick liquids on 03/01/19 and continued per MBSS. Per RN notes, cortrak was d/c due to diet advancement. Meal completion documented at 75-100%. Agree with SLP note that pt may still require supplemental nutrition support due to diet restrictions from dysphagia.  Per chart review, pt with decreased blood pressure and responding only to painful stimuli at this time. Pt is now NPO and plan to replace cortrak.   Labs reviewed: CBGS: 115 (inpatient orders for glycemic control are 0-9 units insulin aspart every 4 hours).   Diet Order:   Diet Order            DIET - DYS 1 Room service  appropriate? Yes; Fluid consistency: Honey Thick  Diet effective now              EDUCATION NEEDS:   No education needs have been identified at this time  Skin:  Skin Assessment: Skin Integrity Issues: Skin Integrity Issues:: Other (Comment) Stage II: R hip healed wound Other: full thickness wound at trach site  Last BM:  03/02/19  Height:   Ht Readings from Last 1 Encounters:  03/02/19 6\' 2"  (1.88 m)    Weight:   Wt Readings from Last 1 Encounters:  03/03/19 96.6 kg    Ideal Body Weight:  86.4 kg  BMI:  Body mass index is 27.35 kg/m.  Estimated Nutritional Needs:   Kcal:  2200-2400  Protein:  130-145 grams  Fluid:  > 2.2 L    03/05/19, RD, LDN, CDCES Registered Dietitian II Certified Diabetes Care and Education Specialist Please refer to [facility] for RD and/or RD on-call/weekend/after hours pager

## 2019-03-04 NOTE — Progress Notes (Signed)
Pt HR bradycardic to the 30's- BP 125/69 Pt asymptomatic- paged [facility] on call

## 2019-03-04 NOTE — Progress Notes (Signed)
SLP Cancellation Note  Patient Details Name: Marc [name] MRN: [id number] DOB: September 06, 1955   Cancelled treatment:       Reason Eval/Treat Not Completed: Other (comment). Pt in the middle of a bath. RN and NT reports pt eating well, PMSV in place for PO, precautions being following. Pt consumed 80% of am meal. SLP reinforced MBS findings and recommendations. Will f/u when pt available.    Abrea Henle, Riley Nearing 03/04/2019, 10:48 AM

## 2019-03-04 NOTE — Progress Notes (Signed)
Patient found to have rectal temp of 92; checked twice to verify accuracy. All other vital signs stable at this time; patient awake and able to follow commands. Paged Dr. Renford Dills; orders received to place patient on [facility]. Rapid response called to bedside to assess patient.  While placing Bair Hugger on patient, patient became bradycardic; HR dropped into the 30s x2 and is sustaining in the 40s-50s. Text paged Dr. Renford Dills with this information.

## 2019-03-04 NOTE — Procedures (Signed)
Cortrak  Person Inserting Tube:  Arnisha Laffoon E, RD Tube Type:  Cortrak - 43 inches Tube Location:  Left nare Initial Placement:  Stomach Secured by: Bridle Technique Used to Measure Tube Placement:  Documented cm marking at nare/ corner of mouth Cortrak Secured At:  69 cm    Cortrak Tube Team Note:  Consult received to place a Cortrak feeding tube.   No x-ray is required. RN may begin using tube.    If the tube becomes dislodged please keep the tube and contact the Cortrak team at [email or web address] ([email or web address]) for replacement.  If after hours and replacement cannot be delayed, place a NG tube and confirm placement with an abdominal x-ray.    Coutney Wildermuth, MS, RD, LDN RD pager number and weekend/on-call pager number located in Amion.   

## 2019-03-04 NOTE — Progress Notes (Signed)
Assessed patient at bedside.  Patient received LR bolus and attached to vent on arrival to ICU.  EEG tech in room and patient questioning EEG.  He is now awake, alert, following commands / appropriate.  Asking for water to drink.  No acute distress.  Significant improvement in mental status. Hold EEG.    Canary Brim, MSN, NP-C [facility] 03/04/2019, 3:46 PM   Please see [email or web address] for pager details.

## 2019-03-04 NOTE — Progress Notes (Signed)
OT Cancellation Note  Patient Details Name: DRE GAMINO MRN: [id number] DOB: 11-Apr-1955   Cancelled Treatment:    Reason Eval/Treat Not Completed: Medical issues which prohibited therapy. Per RN patient has been hypothermic, back on Bair hugger and per chart review now bradycardic, RR was called. Holding for today, will follow acutely as appropriate.     Margaretmary Eddy [facility] 03/04/2019, 11:35 AM

## 2019-03-04 NOTE — Progress Notes (Signed)
Report given to Selena Batten, Charity fundraiser. Patient transferred to [id number] with [facility], rapid response RN.

## 2019-03-04 NOTE — Significant Event (Addendum)
Rapid Response Event Note  Overview: Time Called: 1106 Arrival Time: 1106 Event Type: Cardiac, Other (Comment)(Hypothermia) Pt sinus bradycardic (35-60 bpm) non-sustained at 35 bpm, sustaining at 45-55bpm. Intermittent PVCs and intermittent bigeminy noted on telemetry monitor. Rectal temperature 83F.   BP 159/89, SpO2 98% on 8L Trach collar, 35%  Initial Focused Assessment: Pt arouses to voice and is able to follow commands. PERRLA, 77mm. Pt cool and dry to touch. Pt is at risk for silent aspiration, pt assisted with oral meal this morning by primary RN with no complication or aspiration noted from pt's behavior. Lung sounds are clear, diminished. While at bedside pt becomes harder to arouse (arouses now to painful stimuli) and has periods of decreased RR, manually verified at 8 breaths.   11:55: RR RN called away for another an emergency  12:55: Returned with B. Ollis, NP. ABG and CXR results relayed to NP. Rectal temperature 92.49F. Pt HR up to 60-70s bpm, per RN pt is still having periods of bradycardia. Arouses to painful stimuli -pt will eye open and follow commands, but quickly falls back to sleep. Pt having periods of apnea and breathing is shallow and rate is 8-10 breaths/min while at rest.  Interventions: -CBG: 126 -Warming blanket: monitor temperature and skin integrity q1h while warming blanket is in use.  -Dr. Renford Dills made aware of change in pt's condition. Orders received for: ABG, CXR, Lactic acid, Procalcitonin, CBC with diffential, BMP. PCCM consulted.      -ABG: 7.36/71.9/69.4/40.2 -500cc bolus started for hypotension, BP improved with noxious stimuli. 50-100cc of bolus had infused, bolus stopped.   Plan of Care (if not transferred): Transferred to ICU accompanied by rapid response RN. Pt transported on continuous monitoring and trach collar 12L/50%. No complications during transport. ICU RN at bedside to accept pt on arrival to unit.  Event Summary: Name of Physician  Notified: Dr. Renford Dills at 1115  1255: B. Ollis, NP at bedside to assess pt, decision made to transfer to ICU.   Outcome: Transferred (Comment)(Transferred to ICU.)  Event End Time: 1330  Jennye Moccasin
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
## 2019-03-04 NOTE — Progress Notes (Signed)
Pt's BP noted to be 82/56. Rectal temp 92.9.  HR still intermittently brady in the 30s, sustaining in 70s at this time. Patient only responds to painful stimuli at this time.   Received call from lab w/ critical results pCO2 71.9.   Canary Brim NP and rapid response RN Mitzi Davenport currently at the bedside.

## 2019-03-05 LAB — GLUCOSE, CAPILLARY
Glucose-Capillary: 85 mg/dL (ref 70–99)
Glucose-Capillary: 106 mg/dL — ABNORMAL HIGH (ref 70–99)
Glucose-Capillary: 103 mg/dL — ABNORMAL HIGH (ref 70–99)
Glucose-Capillary: 140 mg/dL — ABNORMAL HIGH (ref 70–99)
Glucose-Capillary: 84 mg/dL (ref 70–99)

## 2019-03-05 LAB — PROCALCITONIN: Procalcitonin: <0.1 ng/mL
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
## 2019-03-06 DIAGNOSIS — R410 Disorientation, unspecified: Secondary | ICD-10-CM

## 2019-03-06 LAB — GLUCOSE, CAPILLARY
Glucose-Capillary: 100 mg/dL — ABNORMAL HIGH (ref 70–99)
Glucose-Capillary: 104 mg/dL — ABNORMAL HIGH (ref 70–99)
Glucose-Capillary: 110 mg/dL — ABNORMAL HIGH (ref 70–99)
Glucose-Capillary: 114 mg/dL — ABNORMAL HIGH (ref 70–99)
Glucose-Capillary: 124 mg/dL — ABNORMAL HIGH (ref 70–99)
Glucose-Capillary: 75 mg/dL (ref 70–99)
Glucose-Capillary: 84 mg/dL (ref 70–99)

## 2019-03-06 LAB — PROCALCITONIN: Procalcitonin: <0.1 ng/mL

## 2019-03-06 MED ORDER — ENOXAPARIN SODIUM 40 MG/0.4ML ~~LOC~~ SOLN
40.0000 mg | SUBCUTANEOUS | Status: DC
  Administered 2019-03-08 – 2019-04-17 (×41): 40 mg via SUBCUTANEOUS
  Filled 2019-03-06 (×42): qty 0.4

## 2019-03-06 NOTE — Progress Notes (Signed)
SLP Cancellation Note  Patient Details Name: TARRELL DEBES MRN: [id number] DOB: Sep 02, 1955   Cancelled treatment:       Reason Eval/Treat Not Completed: Medical issues which prohibited therapy. Concern regarding respiratory and cardiac stability at this time. RN present. Will continue efforts.  Boy Delamater B. Murvin Natal, [facility], CCC-SLP Speech Language Pathologist Office: [phone] Pager: [phone]  Leigh Aurora 03/06/2019, 1:11 PM
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
## 2019-03-06 NOTE — Progress Notes (Signed)
RT called because patients HR was low and desating. Patient placed back on the vent for now. Vitals are stable.

## 2019-03-06 NOTE — Progress Notes (Signed)
Request to IR for percutaneous gastrostomy placement - patient imaging reviewed by Dr. Loreta Ave who approves patient anatomy for procedure.  Will attempt to place g-tube tomorrow (2/26) pending any urgent procedures in IR - if unable to place tomorrow will plan for possible placement Monday (3/1).   NPO/hold tube feeding at midnight 2/26, hold next dose of Lovenox. PA will see on 2/26 for consult/consent.  Lynnette Caffey, PA-C

## 2019-03-07 ENCOUNTER — Inpatient Hospital Stay (HOSPITAL_COMMUNITY): Payer: Medicare Other

## 2019-03-07 DIAGNOSIS — Z930 Tracheostomy status: Secondary | ICD-10-CM

## 2019-03-07 DIAGNOSIS — J9602 Acute respiratory failure with hypercapnia: Secondary | ICD-10-CM

## 2019-03-07 HISTORY — PX: IR GASTROSTOMY TUBE MOD SED: IMG625

## 2019-03-07 LAB — GLUCOSE, CAPILLARY
Glucose-Capillary: 80 mg/dL (ref 70–99)
Glucose-Capillary: 81 mg/dL (ref 70–99)
Glucose-Capillary: 72 mg/dL (ref 70–99)
Glucose-Capillary: 68 mg/dL — ABNORMAL LOW (ref 70–99)
Glucose-Capillary: 82 mg/dL (ref 70–99)
Glucose-Capillary: 97 mg/dL (ref 70–99)

## 2019-03-07 LAB — CBC
HCT: 32.6 % — ABNORMAL LOW (ref 39.0–52.0)
Hemoglobin: 9.4 g/dL — ABNORMAL LOW (ref 13.0–17.0)
MCH: 25 pg — ABNORMAL LOW (ref 26.0–34.0)
MCHC: 28.8 g/dL — ABNORMAL LOW (ref 30.0–36.0)
MCV: 86.7 fL (ref 80.0–100.0)
Platelets: 257 10*3/uL (ref 150–400)
RBC: 3.76 MIL/uL — ABNORMAL LOW (ref 4.22–5.81)
RDW: 16.9 % — ABNORMAL HIGH (ref 11.5–15.5)
WBC: 4.9 10*3/uL (ref 4.0–10.5)
nRBC: 0 % (ref 0.0–0.2)

## 2019-03-07 LAB — PROTIME-INR
INR: 1.1 (ref 0.8–1.2)
Prothrombin Time: 14.2 seconds (ref 11.4–15.2)

## 2019-03-07 LAB — COMPREHENSIVE METABOLIC PANEL
ALT: 7 U/L (ref 0–44)
AST: 14 U/L — ABNORMAL LOW (ref 15–41)
Albumin: 2.4 g/dL — ABNORMAL LOW (ref 3.5–5.0)
Alkaline Phosphatase: 84 U/L (ref 38–126)
Anion gap: 10 (ref 5–15)
BUN: 13 mg/dL (ref 8–23)
CO2: 35 mmol/L — ABNORMAL HIGH (ref 22–32)
Calcium: 8.6 mg/dL — ABNORMAL LOW (ref 8.9–10.3)
Chloride: 98 mmol/L (ref 98–111)
Creatinine, Ser: 0.66 mg/dL (ref 0.61–1.24)
GFR calc Af Amer: >60 mL/min (ref >60)
GFR calc non Af Amer: >60 mL/min (ref >60)
Glucose, Bld: 84 mg/dL (ref 70–99)
Potassium: 3.6 mmol/L (ref 3.5–5.1)
Sodium: 143 mmol/L (ref 135–145)
Total Bilirubin: 0.5 mg/dL (ref 0.3–1.2)
Total Protein: 6.3 g/dL — ABNORMAL LOW (ref 6.5–8.1)

## 2019-03-07 MED ORDER — DEXTROSE 10 % IV SOLN: INTRAVENOUS | Status: DC

## 2019-03-07 MED ORDER — BACITRACIN-NEOMYCIN-POLYMYXIN 400-5-5000 EX OINT
1.0000 "application " | TOPICAL_OINTMENT | Freq: Every day | CUTANEOUS | Status: AC
  Administered 2019-03-07 – 2019-03-13 (×7): 1 via TOPICAL
  Filled 2019-03-07 (×4): qty 1

## 2019-03-07 MED ORDER — NALOXONE HCL 0.4 MG/ML IJ SOLN
INTRAMUSCULAR | Status: AC
  Filled 2019-03-07: qty 1

## 2019-03-07 MED ORDER — LIDOCAINE HCL 1 % IJ SOLN
INTRAMUSCULAR | Status: AC
  Filled 2019-03-07: qty 20

## 2019-03-07 MED ORDER — MELATONIN 3 MG PO TABS
9.0000 mg | ORAL_TABLET | Freq: Every day | ORAL | Status: DC
  Administered 2019-03-08 – 2019-03-11 (×4): 9 mg via ORAL
  Filled 2019-03-07 (×6): qty 3

## 2019-03-07 MED ORDER — FLUMAZENIL 0.5 MG/5ML IV SOLN
INTRAVENOUS | Status: AC
  Filled 2019-03-07: qty 5

## 2019-03-07 MED ORDER — FENTANYL CITRATE (PF) 100 MCG/2ML IJ SOLN
INTRAMUSCULAR | Status: AC
  Filled 2019-03-07: qty 4

## 2019-03-07 MED ORDER — IOHEXOL 300 MG/ML  SOLN
50.0000 mL | Freq: Once | INTRAMUSCULAR | Status: AC | PRN
  Administered 2019-03-07: 15 mL

## 2019-03-07 MED ORDER — MIDAZOLAM HCL 2 MG/2ML IJ SOLN
INTRAMUSCULAR | Status: AC | PRN
  Administered 2019-03-07: 1 mg via INTRAVENOUS

## 2019-03-07 MED ORDER — MIDAZOLAM HCL 2 MG/2ML IJ SOLN
INTRAMUSCULAR | Status: AC
  Filled 2019-03-07: qty 4

## 2019-03-07 MED ORDER — FENTANYL CITRATE (PF) 100 MCG/2ML IJ SOLN
INTRAMUSCULAR | Status: AC | PRN
  Administered 2019-03-07: 50 ug via INTRAVENOUS

## 2019-03-07 NOTE — Procedures (Signed)
Interventional Radiology Procedure Note  Procedure: Placement of percutaneous 20F pull-through gastrostomy tube. Complications: None Recommendations: - NPO except for sips and chips remainder of today and overnight - Maintain G-tube to LWS until tomorrow morning  - May advance diet as tolerated and begin using tube tomorrow morning  Signed,   Stela Iwasaki S. Sobia Karger, DO   
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
## 2019-03-07 NOTE — Progress Notes (Signed)
SLP Cancellation Note  Patient Details Name: Marc [name] MRN: [id number] DOB: October 18, 1955   Cancelled treatment:       Reason Eval/Treat Not Completed: Medical issues which prohibited therapy. Pt unable to tolerate PMSV trials at this time, and is NPO for PEG placement per RN. Will continue efforts.  [name], [facility], CCC-SLP Speech Language Pathologist Office: [phone] Pager: [phone]  Leigh Aurora 03/07/2019, 9:22 AM
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
## 2019-03-07 NOTE — Progress Notes (Signed)
Patient placed on ATC 40% for the day. RT will assess if patient needs ventilator throughout the day. Vitals are stable. Trach care done. RT will continue to monitor.
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
## 2019-03-07 NOTE — Progress Notes (Signed)
Nutrition Follow-up  RD working remotely.  DOCUMENTATION CODES:   Not applicable  INTERVENTION:   -Once feeding access is established, iresume:  Initiate Vital 1.5 @ 55 ml/hr via cortrak tube  30 ml Prostat TID.    200 ml free water flush every 4 hours  Tube feeding regimen provides 2280 kcal (100% of needs), 134 grams of protein, and 1008 ml of H2O. Total free water: 2208 ml  NUTRITION DIAGNOSIS:   Increased nutrient needs related to acute illness as evidenced by estimated needs.  Ongoing  GOAL:   Patient will meet greater than or equal to 90% of their needs  Progressing  MONITOR:   PO intake, Supplement acceptance, Diet advancement, Labs, Weight trends, Skin, I & O's  REASON FOR ASSESSMENT:   Ventilator, Consult Enteral/tube feeding initiation and management  ASSESSMENT:   Patient with PMH significant for reported bipolar disease and DM. Presents this admission with acute encephalopathy from unclear etiology and severe septic shock.  1/23- trach placed 2/12- cortrak tube placed, tip of tube confirmed in stomach 2/21- trasnferred from ICU to PCU 2/22- s/p BSE- advanced to dysphagia 1 diet with honey thick liquids 2/23- cortrak d/c per MD, NPO, cortrak tube replaced- tip of tube in stomach; trasnferred back to ICU for overnight vent support 2/25- cortrak tube removed  Reviewed I/O's: +1.5 L x 24 hours and +30.2 L since 02/21/19  Pt requiring overnight vent support secondary to apenic spells while sleeping with desaturation.   TF currently on hold. Cortrak removed. Plan for PEG placement today or Monday, 03/10/19.   Labs reviewed: CBGS: 72-84.   Diet Order:   Diet Order            Diet NPO time specified  Diet effective midnight              EDUCATION NEEDS:   No education needs have been identified at this time  Skin:  Skin Assessment: Skin Integrity Issues: Skin Integrity Issues:: Other (Comment) Stage II: R hip healed wound Other: full  thickness wound at trach site  Last BM:  03/06/19  Height:   Ht Readings from Last 1 Encounters:  03/02/19 6\' 2"  (1.88 m)    Weight:   Wt Readings from Last 1 Encounters:  03/03/19 96.6 kg    Ideal Body Weight:  86.4 kg  BMI:  Body mass index is 27.35 kg/m.  Estimated Nutritional Needs:   Kcal:  2200-2400  Protein:  130-145 grams  Fluid:  > 2.2 L    03/05/19, RD, LDN, CDCES Registered Dietitian II Certified Diabetes Care and Education Specialist Please refer to [facility] for RD and/or RD on-call/weekend/after hours pager

## 2019-03-08 DIAGNOSIS — J9620 Acute and chronic respiratory failure, unspecified whether with hypoxia or hypercapnia: Secondary | ICD-10-CM

## 2019-03-08 LAB — GLUCOSE, CAPILLARY
Glucose-Capillary: 104 mg/dL — ABNORMAL HIGH (ref 70–99)
Glucose-Capillary: 107 mg/dL — ABNORMAL HIGH (ref 70–99)
Glucose-Capillary: 107 mg/dL — ABNORMAL HIGH (ref 70–99)
Glucose-Capillary: 114 mg/dL — ABNORMAL HIGH (ref 70–99)
Glucose-Capillary: 115 mg/dL — ABNORMAL HIGH (ref 70–99)
Glucose-Capillary: 118 mg/dL — ABNORMAL HIGH (ref 70–99)
Glucose-Capillary: 127 mg/dL — ABNORMAL HIGH (ref 70–99)

## 2019-03-08 LAB — BASIC METABOLIC PANEL
Anion gap: 10 (ref 5–15)
BUN: 11 mg/dL (ref 8–23)
CO2: 33 mmol/L — ABNORMAL HIGH (ref 22–32)
Calcium: 8.7 mg/dL — ABNORMAL LOW (ref 8.9–10.3)
Chloride: 100 mmol/L (ref 98–111)
Creatinine, Ser: 0.85 mg/dL (ref 0.61–1.24)
GFR calc Af Amer: >60 mL/min (ref >60)
GFR calc non Af Amer: >60 mL/min (ref >60)
Glucose, Bld: 117 mg/dL — ABNORMAL HIGH (ref 70–99)
Potassium: 3.6 mmol/L (ref 3.5–5.1)
Sodium: 143 mmol/L (ref 135–145)

## 2019-03-08 LAB — CBC
HCT: 32.5 % — ABNORMAL LOW (ref 39.0–52.0)
Hemoglobin: 9.7 g/dL — ABNORMAL LOW (ref 13.0–17.0)
MCH: 25.3 pg — ABNORMAL LOW (ref 26.0–34.0)
MCHC: 29.8 g/dL — ABNORMAL LOW (ref 30.0–36.0)
MCV: 84.9 fL (ref 80.0–100.0)
Platelets: 265 10*3/uL (ref 150–400)
RBC: 3.83 MIL/uL — ABNORMAL LOW (ref 4.22–5.81)
RDW: 17.1 % — ABNORMAL HIGH (ref 11.5–15.5)
WBC: 5.3 10*3/uL (ref 4.0–10.5)
nRBC: 0 % (ref 0.0–0.2)

## 2019-03-08 NOTE — Progress Notes (Signed)
Pt throwing legs out of bed. This NT informed pt that he is hooked up to many cords and is unable to get up right now. Pt easily redirected.

## 2019-03-08 NOTE — Progress Notes (Signed)
Pt putting legs onto side of the bed outside bed rails. Pt redirected back into bed by this NT.

## 2019-03-08 NOTE — Progress Notes (Addendum)
Pt brief checked, pt still dry.
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
## 2019-03-08 NOTE — Progress Notes (Signed)
Pt brief and bed pads changed.  

## 2019-03-08 NOTE — Progress Notes (Signed)
Pt bed pads and brief changed by this NT and RN. Pt cleaned with washcloth and soap.

## 2019-03-08 NOTE — Progress Notes (Signed)
This NT asked pt if he was wet and needed to be changed, pt shook head "no".
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
## 2019-03-08 NOTE — Progress Notes (Signed)
Nutrition Follow-up  DOCUMENTATION CODES:   Not applicable  INTERVENTION:  Resume Vital 1.5@ 25 ml/hr, advance 10 ml every 6 hrs to goal 31ml/hr  4ml Prostat TIDvia PEG 200 ml free water flush every 4 hours  Tube feeding regimen provides2280kcal (100% of needs),134grams of protein, and of H2O. Total free water: 2208 ml   NUTRITION DIAGNOSIS:   Increased nutrient needs related to acute illness as evidenced by estimated needs.  Ongoing  GOAL:   Patient will meet greater than or equal to 90% of their needs  Progressing  MONITOR:   PO intake, Supplement acceptance, Diet advancement, Labs, Weight trends, Skin, I & O's  REASON FOR ASSESSMENT:   Ventilator, Consult Enteral/tube feeding initiation and management  ASSESSMENT:  RD working remotely.  Patient with PMH significant for reported bipolar disease and DM. Presents this admission with acute encephalopathy from unclear etiology and severe septic shock.  1/23- trach placed 2/12- cortrak tube placed, tip of tube confirmed in stomach 2/21- trasnferred from ICU to PCU 2/22- s/p BSE- advanced to dysphagia 1 diet with honey thick liquids 2/23- cortrak d/c per MD, NPO, cortrak tube replaced- tip of tube in stomach; trasnferred back to ICU for overnight vent support 2/25- cortrak tube removed  Patient is s/p PEG placement on 2/26, RD consulted for management of tube feedings.  Per notes, hypothermic yesterday, temperature 99 today with normal white count. Sitter at bedside due to agitation.   I/Os: +1058 ml x 24 hrs  Medications reviewed Labs: CBGs 97-118   Diet Order:   Diet Order            Diet NPO time specified  Diet effective midnight              EDUCATION NEEDS:   No education needs have been identified at this time  Skin:  Skin Assessment: Skin Integrity Issues: Skin Integrity Issues:: Other (Comment) Stage II: R hip healed wound Other: full thickness wound at trach site  Last  BM:  03/06/19  Height:   Ht Readings from Last 1 Encounters:  03/02/19 6\' 2"  (1.88 m)    Weight:   Wt Readings from Last 1 Encounters:  03/03/19 96.6 kg    Ideal Body Weight:  86.4 kg  BMI:  Body mass index is 27.35 kg/m.  Estimated Nutritional Needs:   Kcal:  2200-2400  Protein:  130-145 grams  Fluid:  > 2.2 L   03/05/19, RD, LDN Clinical Nutrition After Hours/Weekend Pager # in Amion

## 2019-03-08 NOTE — Progress Notes (Signed)
This NT has suctioned around pt trache multiple times per hour this shift 0700-1900.

## 2019-03-09 LAB — CBC WITH DIFFERENTIAL/PLATELET
Abs Immature Granulocytes: 0.02 10*3/uL (ref 0.00–0.07)
Basophils Absolute: 0 10*3/uL (ref 0.0–0.1)
Basophils Relative: 0 %
Eosinophils Absolute: 0.2 10*3/uL (ref 0.0–0.5)
Eosinophils Relative: 4 %
HCT: 30.5 % — ABNORMAL LOW (ref 39.0–52.0)
Hemoglobin: 8.8 g/dL — ABNORMAL LOW (ref 13.0–17.0)
Immature Granulocytes: 0 %
Lymphocytes Relative: 28 %
Lymphs Abs: 1.4 10*3/uL (ref 0.7–4.0)
MCH: 25.1 pg — ABNORMAL LOW (ref 26.0–34.0)
MCHC: 28.9 g/dL — ABNORMAL LOW (ref 30.0–36.0)
MCV: 87.1 fL (ref 80.0–100.0)
Monocytes Absolute: 0.6 10*3/uL (ref 0.1–1.0)
Monocytes Relative: 12 %
Neutro Abs: 2.8 10*3/uL (ref 1.7–7.7)
Neutrophils Relative %: 56 %
Platelets: 240 10*3/uL (ref 150–400)
RBC: 3.5 MIL/uL — ABNORMAL LOW (ref 4.22–5.81)
RDW: 17.2 % — ABNORMAL HIGH (ref 11.5–15.5)
WBC: 5.1 10*3/uL (ref 4.0–10.5)
nRBC: 0 % (ref 0.0–0.2)

## 2019-03-09 LAB — BASIC METABOLIC PANEL
Anion gap: 9 (ref 5–15)
BUN: 11 mg/dL (ref 8–23)
CO2: 32 mmol/L (ref 22–32)
Calcium: 8.5 mg/dL — ABNORMAL LOW (ref 8.9–10.3)
Chloride: 100 mmol/L (ref 98–111)
Creatinine, Ser: 0.68 mg/dL (ref 0.61–1.24)
GFR calc Af Amer: >60 mL/min (ref >60)
GFR calc non Af Amer: >60 mL/min (ref >60)
Glucose, Bld: 123 mg/dL — ABNORMAL HIGH (ref 70–99)
Potassium: 3.8 mmol/L (ref 3.5–5.1)
Sodium: 141 mmol/L (ref 135–145)

## 2019-03-09 LAB — PHOSPHORUS: Phosphorus: 3.2 mg/dL (ref 2.5–4.6)

## 2019-03-09 LAB — GLUCOSE, CAPILLARY
Glucose-Capillary: 122 mg/dL — ABNORMAL HIGH (ref 70–99)
Glucose-Capillary: 137 mg/dL — ABNORMAL HIGH (ref 70–99)
Glucose-Capillary: 108 mg/dL — ABNORMAL HIGH (ref 70–99)
Glucose-Capillary: 92 mg/dL (ref 70–99)
Glucose-Capillary: 88 mg/dL (ref 70–99)

## 2019-03-09 LAB — MAGNESIUM: Magnesium: 1.7 mg/dL (ref 1.7–2.4)

## 2019-03-09 MED ORDER — LACTATED RINGERS IV BOLUS
500.0000 mL | Freq: Once | INTRAVENOUS | Status: AC
  Administered 2019-03-09: 500 mL via INTRAVENOUS

## 2019-03-09 MED ORDER — DEXMEDETOMIDINE HCL IN NACL 400 MCG/100ML IV SOLN
0.2000 ug/kg/h | INTRAVENOUS | Status: DC
  Administered 2019-03-09: 0.4 ug/kg/h via INTRAVENOUS
  Filled 2019-03-09: qty 100

## 2019-03-09 NOTE — Progress Notes (Signed)
Low dose precedex initiated, will continue to monitor.

## 2019-03-09 NOTE — Progress Notes (Signed)
Pt with lots of secretions suctioned via trach, pt tolerated well.   RT notified that patient has cuff leak and at bedside to assess.  Pt temp 95.9 axillary, warm blankets applied.  Pt restless and fidgety in the bed, mouths "I don't want covers on me".  Explained to patient that we don't want him to get too cold and have issues.  Pt allowed blankets at this time, will reassess. Sitter at bedside, no distress noted.

## 2019-03-09 NOTE — Progress Notes (Signed)
Pt restless, growing confusion, harder to redirect.  Pt has not slept any tonight.  With sitter at bedside, found disconnected from ventilator, both legs straddling side-rail.  Pt reconnected to vent, assisted to lying back in bed.  Called eLink RN informed of pt agitation/restless and threatening lines and trach, no PRN for agitation to give at this time.  Await MD response.

## 2019-03-09 NOTE — Progress Notes (Signed)
eLink Physician-Brief Progress Note Patient Name: Marc [name] DOB: March 02, 1955 MRN: [id number]   Date of Service  03/09/2019  HPI/Events of Note  Pt needs BMET,  Low blood pressure on low dose precedex.  eICU Interventions  BMET ordered for a.m., discontinue precedex, LR 500 ml iv fluid bolus        Alfons Sulkowski U Jazmina Muhlenkamp 03/09/2019, 4:40 AM

## 2019-03-09 NOTE — Progress Notes (Signed)
eLink Physician-Brief Progress Note Patient Name: Marc [name] DOB: April 15, 1955 MRN: [id number]   Date of Service  03/09/2019  HPI/Events of Note  Pt very agitated and pulling at lines, etc.  eICU Interventions  Trial of low level Precedex infusion.        Levaeh Vice U Kaisen Ackers 03/09/2019, 2:10 AM

## 2019-03-09 NOTE — Progress Notes (Signed)
Vinnie Langton, RN from elink camera'd into room.  Precedex off, labs sent.  SBP remains in 70s however MAP >60s, per elink that's ok for now. Cycling BP every 15 mins.  Also informed elink that cbc, mag, and phosp were only ordered, if BMP wanted then an order is needed. Pt arousals to minimal physical stimulation.

## 2019-03-09 NOTE — Progress Notes (Signed)
RN in elink notified that pt SBP 70 on precedex, turned gtt off at this time.  Told to draw A.M. labs now.  Will continue to monitor.

## 2019-03-09 NOTE — Progress Notes (Signed)
Labs collected and sent.

## 2019-03-10 LAB — CBC WITH DIFFERENTIAL/PLATELET
Abs Immature Granulocytes: 0.02 10*3/uL (ref 0.00–0.07)
Basophils Absolute: 0 10*3/uL (ref 0.0–0.1)
Basophils Relative: 1 %
Eosinophils Absolute: 0.3 10*3/uL (ref 0.0–0.5)
Eosinophils Relative: 4 %
HCT: 32.1 % — ABNORMAL LOW (ref 39.0–52.0)
Hemoglobin: 9.3 g/dL — ABNORMAL LOW (ref 13.0–17.0)
Immature Granulocytes: 0 %
Lymphocytes Relative: 27 %
Lymphs Abs: 1.7 10*3/uL (ref 0.7–4.0)
MCH: 25 pg — ABNORMAL LOW (ref 26.0–34.0)
MCHC: 29 g/dL — ABNORMAL LOW (ref 30.0–36.0)
MCV: 86.3 fL (ref 80.0–100.0)
Monocytes Absolute: 0.8 10*3/uL (ref 0.1–1.0)
Monocytes Relative: 12 %
Neutro Abs: 3.7 10*3/uL (ref 1.7–7.7)
Neutrophils Relative %: 56 %
Platelets: 268 10*3/uL (ref 150–400)
RBC: 3.72 MIL/uL — ABNORMAL LOW (ref 4.22–5.81)
RDW: 17 % — ABNORMAL HIGH (ref 11.5–15.5)
WBC: 6.5 10*3/uL (ref 4.0–10.5)
nRBC: 0 % (ref 0.0–0.2)

## 2019-03-10 LAB — GLUCOSE, CAPILLARY
Glucose-Capillary: 100 mg/dL — ABNORMAL HIGH (ref 70–99)
Glucose-Capillary: 101 mg/dL — ABNORMAL HIGH (ref 70–99)
Glucose-Capillary: 103 mg/dL — ABNORMAL HIGH (ref 70–99)
Glucose-Capillary: 90 mg/dL (ref 70–99)
Glucose-Capillary: 93 mg/dL (ref 70–99)
Glucose-Capillary: 97 mg/dL (ref 70–99)
Glucose-Capillary: 98 mg/dL (ref 70–99)

## 2019-03-10 LAB — PHOSPHORUS: Phosphorus: 3.1 mg/dL (ref 2.5–4.6)

## 2019-03-10 LAB — MAGNESIUM: Magnesium: 1.9 mg/dL (ref 1.7–2.4)

## 2019-03-10 MED ORDER — QUETIAPINE FUMARATE 50 MG PO TABS
25.0000 mg | ORAL_TABLET | Freq: Every day | ORAL | Status: DC
  Administered 2019-03-10 – 2019-03-11 (×2): 25 mg via ORAL
  Filled 2019-03-10 (×2): qty 1

## 2019-03-10 NOTE — Progress Notes (Signed)
Pt with sitter at bedside pulled IV in right hand out, found in bed , tip in tact.  Reaching in the air and nothing is there.  Pt oriented to self.  Will continue to monitor.
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
## 2019-03-10 NOTE — Progress Notes (Signed)
Pt growing restless and picking at lines, however able to redirect easily at this time.  Right upper arm IV wrapped in kerlex to protect.  No sitter at this time.  Bed low and locked, mats on floor, bed alarm set, frequent nursing rounds.  Will continue to monitor.

## 2019-03-11 LAB — GLUCOSE, CAPILLARY
Glucose-Capillary: 112 mg/dL — ABNORMAL HIGH (ref 70–99)
Glucose-Capillary: 64 mg/dL — ABNORMAL LOW (ref 70–99)
Glucose-Capillary: 82 mg/dL (ref 70–99)
Glucose-Capillary: 95 mg/dL (ref 70–99)
Glucose-Capillary: 98 mg/dL (ref 70–99)
Glucose-Capillary: 98 mg/dL (ref 70–99)
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
## 2019-03-11 NOTE — Progress Notes (Signed)
PCCM Progress Note  Transferring patient to [facility]. Will transfer to [facility]. PCCM will follow  Mechele Collin, M.D. [facility] Pulmonary/Critical Care Medicine 03/11/2019 5:50 PM

## 2019-03-11 NOTE — Progress Notes (Addendum)
Trach changed using two RT's. Patient did have a small amount of bleeding for procedure. RT remained with patient until bleeding stopped. Color changed was noted. BBS were heard. RT made RN and MD aware of patient bleeding after trach change.   Trach change is due for this patient every 4 weeks per CCM. Next change should be on 03/12/2019.

## 2019-03-12 LAB — GLUCOSE, CAPILLARY
Glucose-Capillary: 113 mg/dL — ABNORMAL HIGH (ref 70–99)
Glucose-Capillary: 99 mg/dL (ref 70–99)
Glucose-Capillary: 100 mg/dL — ABNORMAL HIGH (ref 70–99)
Glucose-Capillary: 93 mg/dL (ref 70–99)
Glucose-Capillary: 111 mg/dL — ABNORMAL HIGH (ref 70–99)
Glucose-Capillary: 92 mg/dL (ref 70–99)
Glucose-Capillary: 59 mg/dL — ABNORMAL LOW (ref 70–99)

## 2019-03-12 LAB — CBC
HCT: 32.5 % — ABNORMAL LOW (ref 39.0–52.0)
Hemoglobin: 9.6 g/dL — ABNORMAL LOW (ref 13.0–17.0)
MCH: 24.9 pg — ABNORMAL LOW (ref 26.0–34.0)
MCHC: 29.5 g/dL — ABNORMAL LOW (ref 30.0–36.0)
MCV: 84.4 fL (ref 80.0–100.0)
Platelets: 241 10*3/uL (ref 150–400)
RBC: 3.85 MIL/uL — ABNORMAL LOW (ref 4.22–5.81)
RDW: 17.2 % — ABNORMAL HIGH (ref 11.5–15.5)
WBC: 5.8 10*3/uL (ref 4.0–10.5)
nRBC: 0 % (ref 0.0–0.2)

## 2019-03-12 LAB — BASIC METABOLIC PANEL
Anion gap: 10 (ref 5–15)
BUN: 21 mg/dL (ref 8–23)
CO2: 30 mmol/L (ref 22–32)
Calcium: 8.4 mg/dL — ABNORMAL LOW (ref 8.9–10.3)
Chloride: 100 mmol/L (ref 98–111)
Creatinine, Ser: 0.68 mg/dL (ref 0.61–1.24)
GFR calc Af Amer: >60 mL/min (ref >60)
GFR calc non Af Amer: >60 mL/min (ref >60)
Glucose, Bld: 109 mg/dL — ABNORMAL HIGH (ref 70–99)
Potassium: 4.4 mmol/L (ref 3.5–5.1)
Sodium: 140 mmol/L (ref 135–145)

## 2019-03-12 LAB — PHOSPHORUS: Phosphorus: 3 mg/dL (ref 2.5–4.6)

## 2019-03-12 LAB — MAGNESIUM: Magnesium: 1.8 mg/dL (ref 1.7–2.4)

## 2019-03-12 MED ORDER — QUETIAPINE FUMARATE 50 MG PO TABS
25.0000 mg | ORAL_TABLET | Freq: Every day | ORAL | Status: DC
  Administered 2019-03-12 – 2019-04-16 (×36): 25 mg
  Filled 2019-03-12 (×36): qty 1

## 2019-03-12 MED ORDER — LACTULOSE 10 GM/15ML PO SOLN
20.0000 g | Freq: Every day | ORAL | Status: DC | PRN
  Administered 2019-03-22 – 2019-03-23 (×2): 20 g
  Filled 2019-03-12 (×2): qty 30

## 2019-03-12 MED ORDER — MIDODRINE HCL 5 MG PO TABS
5.0000 mg | ORAL_TABLET | Freq: Three times a day (TID) | ORAL | Status: DC
  Administered 2019-03-12 – 2019-03-20 (×24): 5 mg
  Filled 2019-03-12 (×24): qty 1

## 2019-03-12 MED ORDER — MELATONIN 3 MG PO TABS
9.0000 mg | ORAL_TABLET | Freq: Every day | ORAL | Status: DC
  Administered 2019-03-12 – 2019-04-16 (×36): 9 mg
  Filled 2019-03-12 (×38): qty 3
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
## 2019-03-12 NOTE — TOC Initial Note (Signed)
Transition of Care [facility]) - Initial/Assessment Note    Patient Details  Name: Marc [name] MRN: [id number] Date of Birth: Feb 18, 1955  [facility] CM/SW Contact:    Eduard Roux, LCSWA Phone Number: 03/12/2019, 4:28 PM  Clinical Narrative:                  CSW called and spoke with the patient's son,Dominique. CSW introduced self and explained role. Patient's son confirmed he agreed with PT/OT recommendation of SNF. CSW explained the SNF process and possible challenges for placement. Patient's son states no SNF preference. CSW  given permission to send SNF referrals.   CSW will continue to follow and assist with discharge planning.   Antony Blackbird, MSW, LCSWA Clinical Social Worker   Expected Discharge Plan: Skilled Nursing Facility Barriers to Discharge: Continued Medical Work up   Patient Goals and CMS Choice        Expected Discharge Plan and Services Expected Discharge Plan: Skilled Nursing Facility In-house Referral: Clinical Social Work     Living arrangements for the past 2 months: Single Family Home                                      Prior Living Arrangements/Services Living arrangements for the past 2 months: Single Family Home Lives with:: Self Patient language and need for interpreter reviewed:: No        Need for Family Participation in Patient Care: Yes (Comment) Care giver support system in place?: Yes (comment)   Criminal Activity/Legal Involvement Pertinent to Current Situation/Hospitalization: No - Comment as needed  Activities of Daily Living Home Assistive Devices/Equipment: None ADL Screening (condition at time of admission) Patient's cognitive ability adequate to safely complete daily activities?: No Is the patient deaf or have difficulty hearing?: No Does the patient have difficulty seeing, even when wearing glasses/contacts?: No Does the patient have difficulty concentrating, remembering, or making  decisions?: Yes Patient able to express need for assistance with ADLs?: No Does the patient have difficulty dressing or bathing?: Yes Independently performs ADLs?: No Communication: Independent with device (comment)(PMV) Dressing (OT): Needs assistance Is this a change from baseline?: Change from baseline, expected to last >3 days Grooming: Needs assistance Is this a change from baseline?: Change from baseline, expected to last >3 days Feeding: Needs assistance Is this a change from baseline?: Change from baseline, expected to last >3 days Bathing: Needs assistance Is this a change from baseline?: Change from baseline, expected to last >3 days Toileting: Needs assistance Is this a change from baseline?: Change from baseline, expected to last >3days In/Out Bed: Needs assistance Is this a change from baseline?: Change from baseline, expected to last >3 days Walks in Home: Dependent Is this a change from baseline?: Change from baseline, expected to last >3 days Does the patient have difficulty walking or climbing stairs?: Yes Weakness of Legs: Both Weakness of Arms/Hands: Both  Permission Sought/Granted Permission sought to share information with : Family Supports, Magazine features editor, Case Estate manager/land agent granted to share information with : Yes, Verbal Permission Granted  Share Information with NAME: Masiyah Engen  Permission granted to share info w AGENCY: SNFs  Permission granted to share info w Relationship: son  Permission granted to share info w Contact Information: [phone]  Emotional Assessment         Alcohol / Substance Use: Not Applicable Psych Involvement: No (comment)  Admission  diagnosis:  Encephalopathy [G93.40] Encounter for central line placement [Z45.2] Hypothermia, initial encounter [T68.XXXA] Altered mental status, unspecified altered mental status type [R41.82] Patient Active Problem List   Diagnosis Date Noted  . Altered mental  status   . Pressure injury of skin 01/29/2019  . Staphylococcus aureus bacteremia 01/23/2019  . Bipolar 1 disorder ([place]) 01/23/2019  . Cigarette smoker 01/23/2019  . Dermatitis 01/23/2019  . Acute respiratory failure ([place])   . Encephalopathy 01/21/2019   PCP:  Patient, [name] Pharmacy:   [id number] - [place], [place] [address] [place] [address] Phone: [phone] Fax: [phone]     Social Determinants of Health (SDOH) Interventions    Readmission Risk Interventions No flowsheet data found.
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
## 2019-03-13 LAB — GLUCOSE, CAPILLARY
Glucose-Capillary: 104 mg/dL — ABNORMAL HIGH (ref 70–99)
Glucose-Capillary: 106 mg/dL — ABNORMAL HIGH (ref 70–99)
Glucose-Capillary: 108 mg/dL — ABNORMAL HIGH (ref 70–99)
Glucose-Capillary: 126 mg/dL — ABNORMAL HIGH (ref 70–99)
Glucose-Capillary: 83 mg/dL (ref 70–99)
Glucose-Capillary: 96 mg/dL (ref 70–99)

## 2019-03-13 MED ORDER — OSMOLITE 1.5 CAL PO LIQD
1000.0000 mL | ORAL | Status: DC
  Administered 2019-03-13 – 2019-03-23 (×12): 1000 mL
  Filled 2019-03-13 (×21): qty 1000

## 2019-03-13 MED ORDER — FUROSEMIDE 10 MG/ML IJ SOLN
40.0000 mg | Freq: Once | INTRAMUSCULAR | Status: AC
  Administered 2019-03-13: 40 mg via INTRAVENOUS
  Filled 2019-03-13: qty 4

## 2019-03-13 MED ORDER — SCOPOLAMINE 1 MG/3DAYS TD PT72
1.0000 | MEDICATED_PATCH | TRANSDERMAL | Status: DC
  Administered 2019-03-13: 1.5 mg via TRANSDERMAL
  Filled 2019-03-13 (×3): qty 1
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
## 2019-03-13 NOTE — TOC Progression Note (Signed)
Transition of Care [facility]) - Progression Note    Patient Details  Name: Marc [name] MRN: [id number] Date of Birth: 07/19/1955  [facility]) CM/SW Contact  Eduard Roux, [place] Phone Number: 03/13/2019, 4:49 PM  Clinical Narrative:     CSW sent referral to [facility]/SNF. CSW contacted [facility] and requested they review . CSW awaiting on response.  Antony Blackbird, MSW, LCSWA Clinical Social Worker   Expected Discharge Plan: Skilled Nursing Facility Barriers to Discharge: Continued Medical Work up  Expected Discharge Plan and Services Expected Discharge Plan: Skilled Nursing Facility In-house Referral: Clinical Social Work     Living arrangements for the past 2 months: Single Family Home                                       Social Determinants of Health (SDOH) Interventions    Readmission Risk Interventions No flowsheet data found.
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
## 2019-03-13 NOTE — Progress Notes (Signed)
Nutrition Follow-up  DOCUMENTATION CODES:   Not applicable  INTERVENTION:  Discontinue Vital 1.5 formula.   Initiate Osmolite 1.5 formula via PEG at 30 ml/hr and increase by 10 ml every 4 hours to goal rate of 60 ml/hr.   Conitnue 30 ml Prostat TID per tube.   Continue 200 ml free water flushes every 4 hours. (MD to adjust as appropriate)  Tube feeding regimen to provide 2460 kcal, 135 grams of protein, and 2294 ml water.   Recommend obtaining new weight to full assess weight trends.   NUTRITION DIAGNOSIS:   Increased nutrient needs related to acute illness as evidenced by estimated needs; ongoing  GOAL:   Patient will meet greater than or equal to 90% of their needs; met with TF  MONITOR:   PO intake, Supplement acceptance, Diet advancement, Labs, Weight trends, Skin, I & O's  REASON FOR ASSESSMENT:   Ventilator, Consult Enteral/tube feeding initiation and management  ASSESSMENT:   Patient with PMH significant for reported bipolar disease and DM. Presents this admission with acute encephalopathy from unclear etiology and severe septic shock.  1/23- trach placed 2/12- cortrak tube placed, tip of tube confirmed in stomach 2/21- trasnferred from ICU to PCU 2/22- s/p BSE- advanced to dysphagia 1 diet with honey thick liquids 2/23- cortrak d/c per MD, NPO,cortrak tube replaced- tip of tube in stomach; trasnferred back to ICU for overnight vent support 2/25- cortrak tube removed 2/26 PEG placed  Pt on trach collar during the day and vent via trach at night. Pt continues on NPO status. Pt has been tolerating his tube feedings well with no difficulties. RD to modify tube feeding formula as pt no longer ICU status with no need to continue specialized Vital formula. Feeding formula may be switched to a standard formula. RD to continue to monitor for tolerance.   Labs and medications reviewed.   Diet Order:   Diet Order            Diet NPO time specified  Diet effective  midnight              EDUCATION NEEDS:   No education needs have been identified at this time  Skin:  Skin Assessment: Skin Integrity Issues: Skin Integrity Issues:: Stage II, Other (Comment) Stage II: R hip Other: full thickness wound at trach site  Last BM:  3/3  Height:   Ht Readings from Last 1 Encounters:  03/02/19 6' 2"  (1.88 m)    Weight:   Wt Readings from Last 1 Encounters:  No data found for Wt  03/03/19 96.6 kg  Ideal Body Weight:  86.4 kg  BMI:  Body mass index is 27.35 kg/m.  Estimated Nutritional Needs:   Kcal:  2250-2450  Protein:  130-145 grams  Fluid:  > 2.2 L    Corrin Parker, MS, RD, LDN Pager # [phone] After hours/ weekend pager # [phone]
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
## 2019-03-14 LAB — BASIC METABOLIC PANEL
Anion gap: 9 (ref 5–15)
BUN: 21 mg/dL (ref 8–23)
CO2: 35 mmol/L — ABNORMAL HIGH (ref 22–32)
Calcium: 9 mg/dL (ref 8.9–10.3)
Chloride: 99 mmol/L (ref 98–111)
Creatinine, Ser: 0.76 mg/dL (ref 0.61–1.24)
GFR calc Af Amer: >60 mL/min (ref >60)
GFR calc non Af Amer: >60 mL/min (ref >60)
Glucose, Bld: 114 mg/dL — ABNORMAL HIGH (ref 70–99)
Potassium: 4.1 mmol/L (ref 3.5–5.1)
Sodium: 143 mmol/L (ref 135–145)

## 2019-03-14 LAB — CBC
HCT: 35 % — ABNORMAL LOW (ref 39.0–52.0)
Hemoglobin: 10.3 g/dL — ABNORMAL LOW (ref 13.0–17.0)
MCH: 25 pg — ABNORMAL LOW (ref 26.0–34.0)
MCHC: 29.4 g/dL — ABNORMAL LOW (ref 30.0–36.0)
MCV: 85 fL (ref 80.0–100.0)
Platelets: 258 10*3/uL (ref 150–400)
RBC: 4.12 MIL/uL — ABNORMAL LOW (ref 4.22–5.81)
RDW: 16.7 % — ABNORMAL HIGH (ref 11.5–15.5)
WBC: 5.7 10*3/uL (ref 4.0–10.5)
nRBC: 0 % (ref 0.0–0.2)

## 2019-03-14 LAB — GLUCOSE, CAPILLARY
Glucose-Capillary: 100 mg/dL — ABNORMAL HIGH (ref 70–99)
Glucose-Capillary: 104 mg/dL — ABNORMAL HIGH (ref 70–99)
Glucose-Capillary: 106 mg/dL — ABNORMAL HIGH (ref 70–99)
Glucose-Capillary: 116 mg/dL — ABNORMAL HIGH (ref 70–99)
Glucose-Capillary: 152 mg/dL — ABNORMAL HIGH (ref 70–99)
Glucose-Capillary: 91 mg/dL (ref 70–99)
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
## 2019-03-14 NOTE — TOC Progression Note (Signed)
Transition of Care [facility]) - Progression Note    Patient Details  Name: Marc [name] MRN: [id number] Date of Birth: 09/09/1955  Transition of Care [facility]) CM/SW Contact  Baldemar Lenis, [place] Phone Number: 03/14/2019, 1:38 PM  Clinical Narrative:  CSW contacted [facility], left a message for Admissions to follow up on referral sent yesterday. Waiting on response. CSW to follow.     Expected Discharge Plan: Skilled Nursing Facility Barriers to Discharge: Continued Medical Work up  Expected Discharge Plan and Services Expected Discharge Plan: Skilled Nursing Facility In-house Referral: Clinical Social Work     Living arrangements for the past 2 months: Single Family Home                                       Social Determinants of Health (SDOH) Interventions    Readmission Risk Interventions No flowsheet data found.
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
## 2019-03-15 LAB — GLUCOSE, CAPILLARY
Glucose-Capillary: 102 mg/dL — ABNORMAL HIGH (ref 70–99)
Glucose-Capillary: 119 mg/dL — ABNORMAL HIGH (ref 70–99)
Glucose-Capillary: 128 mg/dL — ABNORMAL HIGH (ref 70–99)
Glucose-Capillary: 65 mg/dL — ABNORMAL LOW (ref 70–99)
Glucose-Capillary: 72 mg/dL (ref 70–99)
Glucose-Capillary: 85 mg/dL (ref 70–99)
Glucose-Capillary: 86 mg/dL (ref 70–99)

## 2019-03-15 MED ORDER — CAMPHOR-MENTHOL 0.5-0.5 % EX LOTN
TOPICAL_LOTION | CUTANEOUS | Status: DC | PRN
  Filled 2019-03-15 (×5): qty 222

## 2019-03-15 NOTE — Progress Notes (Signed)
Patient seen and examined, no changes from my note 3/5 -Remains pleasant, tolerating tube feeds and trach collar during the day, back on the ventilator nightly -Mental status improving, off sitter now -Continue current care  Zannie Cove, MD

## 2019-03-15 NOTE — Progress Notes (Signed)
Pt temps 94.2 and 93.8 rectally. Pt is asymptomatic. Warm blankets applied and MD notified. No new orders.

## 2019-03-16 LAB — GLUCOSE, CAPILLARY
Glucose-Capillary: 111 mg/dL — ABNORMAL HIGH (ref 70–99)
Glucose-Capillary: 113 mg/dL — ABNORMAL HIGH (ref 70–99)
Glucose-Capillary: 119 mg/dL — ABNORMAL HIGH (ref 70–99)
Glucose-Capillary: 98 mg/dL (ref 70–99)
Glucose-Capillary: 98 mg/dL (ref 70–99)

## 2019-03-16 LAB — BASIC METABOLIC PANEL
Anion gap: 11 (ref 5–15)
BUN: 36 mg/dL — ABNORMAL HIGH (ref 8–23)
CO2: 32 mmol/L (ref 22–32)
Calcium: 8.7 mg/dL — ABNORMAL LOW (ref 8.9–10.3)
Chloride: 99 mmol/L (ref 98–111)
Creatinine, Ser: 0.9 mg/dL (ref 0.61–1.24)
GFR calc Af Amer: >60 mL/min (ref >60)
GFR calc non Af Amer: >60 mL/min (ref >60)
Glucose, Bld: 121 mg/dL — ABNORMAL HIGH (ref 70–99)
Potassium: 4.6 mmol/L (ref 3.5–5.1)
Sodium: 142 mmol/L (ref 135–145)

## 2019-03-16 LAB — CBC
HCT: 36.5 % — ABNORMAL LOW (ref 39.0–52.0)
Hemoglobin: 10.9 g/dL — ABNORMAL LOW (ref 13.0–17.0)
MCH: 25.1 pg — ABNORMAL LOW (ref 26.0–34.0)
MCHC: 29.9 g/dL — ABNORMAL LOW (ref 30.0–36.0)
MCV: 83.9 fL (ref 80.0–100.0)
Platelets: 265 10*3/uL (ref 150–400)
RBC: 4.35 MIL/uL (ref 4.22–5.81)
RDW: 16.7 % — ABNORMAL HIGH (ref 11.5–15.5)
WBC: 6 10*3/uL (ref 4.0–10.5)
nRBC: 0 % (ref 0.0–0.2)
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
## 2019-03-16 NOTE — Progress Notes (Addendum)
Pt seen and examined -Temp low yesterday, rest of vital signs were stable -No changes from previous progress notes -Continue trach collar during the day and vent support nightly -await VENT SNF  Zannie Cove, MD

## 2019-03-17 LAB — GLUCOSE, CAPILLARY
Glucose-Capillary: 115 mg/dL — ABNORMAL HIGH (ref 70–99)
Glucose-Capillary: 118 mg/dL — ABNORMAL HIGH (ref 70–99)
Glucose-Capillary: 146 mg/dL — ABNORMAL HIGH (ref 70–99)
Glucose-Capillary: 74 mg/dL (ref 70–99)
Glucose-Capillary: 84 mg/dL (ref 70–99)
Glucose-Capillary: 84 mg/dL (ref 70–99)
Glucose-Capillary: 85 mg/dL (ref 70–99)

## 2019-03-17 NOTE — Progress Notes (Signed)
Unable to get oral or axillary temperature on patient. Rectal temperature taken at 94.5. Patient was went in urine at time and felt cool. Low temperatures not new for patient, but Dr. Joya Martyr was paged to make aware. Orders to continue monitoring patient, since not new finding and other vital signs stable.

## 2019-03-17 NOTE — NC FL2 (Signed)
[place] MEDICAID FL2 LEVEL OF CARE SCREENING TOOL     IDENTIFICATION  Patient Name: Marc [name] Birthdate: 03-13-1955 Sex: male Admission Date (Current Location): 01/21/2019  [facility] and [place] Number:  Producer, television/film/video and Address:  [facility] [place], [address], [place], [place] [address]      Provider Number: [id number]  Attending Physician Name and Address:  Zannie Cove, MD  Relative Name and Phone Number:  Mirl Hillery [phone]    Current Level of Care: Hospital Recommended Level of Care: Skilled Nursing Facility, Vent SNF Prior Approval Number:    Date Approved/Denied:   PASRR Number: Pending- Under Review  Discharge Plan: SNF    Current Diagnoses: Patient Active Problem List   Diagnosis Date Noted  . Altered mental status   . Pressure injury of skin 01/29/2019  . Staphylococcus aureus bacteremia 01/23/2019  . Bipolar 1 disorder (HCC) 01/23/2019  . Cigarette smoker 01/23/2019  . Dermatitis 01/23/2019  . Acute respiratory failure (HCC)   . Encephalopathy 01/21/2019    Orientation RESPIRATION BLADDER Height & Weight     Place, Time  Tracheostomy, Vent(shiley 34mm cuffes, o2 flow rate 5 Fio2 28 , pateint uses vent at night) Incontinent Weight: (unable to get on bed) Height:  6\' 2"  (188 cm)  BEHAVIORAL SYMPTOMS/MOOD NEUROLOGICAL BOWEL NUTRITION STATUS      Incontinent Diet, Feeding tube(Peg Tube- please see discharge summary)  AMBULATORY STATUS COMMUNICATION OF NEEDS Skin   Limited Assist Verbally Surgical wounds(pressure injury,hip right stage 2,  wound/incision neck mild full thickness wound underneath lower trach face plate-foam lift dressing)                       Personal Care Assistance Level of Assistance  Bathing, Feeding, Dressing Bathing Assistance: Limited assistance Feeding assistance: Independent Dressing Assistance: Limited assistance     Functional Limitations Info  Sight, Hearing, Speech  Sight Info: Impaired(wears glasses) Hearing Info: Adequate Speech Info: Adequate    SPECIAL CARE FACTORS FREQUENCY  PT (By licensed PT), OT (By licensed OT)     PT Frequency: 4x per week OT Frequency: 4x per week            Contractures Contractures Info: Not present    Additional Factors Info  Code Status, Allergies, Psychotropic Code Status Info: FULL Allergies Info: Chlorhexidine Psychotropic Info: QUEtiapine (SEROQUEL) tablet 25 mg         Current Medications (03/17/2019):  This is the current hospital active medication list Current Facility-Administered Medications  Medication Dose Route Frequency Provider Last Rate Last Admin  . 0.9 %  sodium chloride infusion   Intravenous PRN 05/17/2019, MD   Stopped at 03/11/19 0554  . camphor-menthol (SARNA) lotion   Topical PRN 05/11/19, MD      . enoxaparin (LOVENOX) injection 40 mg  40 mg Subcutaneous Q24H Zannie Cove A, PA-C   40 mg at 03/17/19 0906  . feeding supplement (OSMOLITE 1.5 CAL) liquid 1,000 mL  1,000 mL Per Tube Continuous 05/17/19, MD 60 mL/hr at 03/17/19 0830 1,000 mL at 03/17/19 0830  . feeding supplement (PRO-STAT SUGAR FREE 64) liquid 30 mL  30 mL Per Tube TID 05/17/19, MD   30 mL at 03/17/19 0906  . folic acid (FOLVITE) tablet 1 mg  1 mg Per Tube Daily 05/17/19 B, RPH   1 mg at 03/17/19 0906  . free water 200 mL  200 mL Per Tube Q4H Adhikari,  Amrit, MD   200 mL at 03/17/19 1200  . ipratropium-albuterol (DUONEB) 0.5-2.5 (3) MG/3ML nebulizer solution 3 mL  3 mL Nebulization Q4H PRN Candee Furbish, MD      . lactulose (CHRONULAC) 10 GM/15ML solution 20 g  20 g Per Tube Daily PRN Domenic Polite, MD      . MEDLINE mouth rinse  15 mL Mouth Rinse 10 times per day Candee Furbish, MD   15 mL at 03/17/19 1415  . Melatonin TABS 9 mg  9 mg Per Tube QHS Domenic Polite, MD   9 mg at 03/16/19 2133  . midodrine (PROAMATINE) tablet 5 mg  5 mg Per Tube TID Domenic Polite, MD   5 mg at  03/17/19 0906  . ondansetron (ZOFRAN) injection 4 mg  4 mg Intravenous Q6H PRN Chesley Mires, MD      . pantoprazole sodium (PROTONIX) 40 mg/20 mL oral suspension 40 mg  40 mg Per Tube Daily Sloan Leiter B, RPH   40 mg at 03/17/19 0906  . QUEtiapine (SEROQUEL) tablet 25 mg  25 mg Per Tube QHS Domenic Polite, MD   25 mg at 03/16/19 2133  . Resource ThickenUp Clear   Oral PRN Chesley Mires, MD      . scopolamine (TRANSDERM-SCOP) 1 MG/3DAYS 1.5 mg  1 patch Transdermal Q72H Domenic Polite, MD   1.5 mg at 03/13/19 1613  . sennosides (SENOKOT) 8.8 MG/5ML syrup 5 mL  5 mL Per Tube QHS Sloan Leiter B, RPH   5 mL at 03/14/19 2125  . sodium chloride 0.9 % bolus 500 mL  500 mL Intravenous Once PRN Adhikari, Amrit, MD      . sodium chloride flush (NS) 0.9 % injection 10-40 mL  10-40 mL Intracatheter Q12H Shelly Coss, MD   10 mL at 03/17/19 0907  . sodium chloride flush (NS) 0.9 % injection 10-40 mL  10-40 mL Intracatheter PRN Adhikari, Amrit, MD      . triamcinolone 0.1 % cream : eucerin cream, 1:1   Topical BID Chesley Mires, MD   Given at 03/17/19 0907     Discharge Medications: Please see discharge summary for a list of discharge medications.  Relevant Imaging Results:  Relevant Lab Results:   Additional Information SSN [id number]  [name], LCSWA

## 2019-03-17 NOTE — Progress Notes (Signed)
Patient having visual hallucinations. Looks like he is trying to eat something. Dr. Joya Martyr paged since this seemed to be a new finding. Dr. Joya Martyr unconcerned as he has had hallucinations before and has had a long length of stay. Updated Preetha with rectal temperature of 95.7 as well.

## 2019-03-17 NOTE — TOC Progression Note (Signed)
Transition of Care [facility]) - Progression Note    Patient Details  Name: Marc [name] MRN: [id number] Date of Birth: [birth year]  Transition of Care [facility]) CM/SW Contact  Eduard Roux, [place] Phone Number: 03/17/2019, 3:53 PM  Clinical Narrative:     CSW faxed clinicals and requested documents to [facility] for review for SNF approval. Waiting on response.   [facility] M.Ludwig Lean to confirm she received application for extended care services.   Contacted [facility] to follow up on referral- left voice message to return call.    Antony Blackbird, MSW, LCSWA Clinical Social Worker     Expected Discharge Plan: Skilled Nursing Facility Barriers to Discharge: Continued Medical Work up  Expected Discharge Plan and Services Expected Discharge Plan: Skilled Nursing Facility In-house Referral: Clinical Social Work     Living arrangements for the past 2 months: Single Family Home                                       Social Determinants of Health (SDOH) Interventions    Readmission Risk Interventions No flowsheet data found.
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
## 2019-03-18 LAB — BASIC METABOLIC PANEL
Anion gap: 14 (ref 5–15)
Anion gap: 11 (ref 5–15)
BUN: 37 mg/dL — ABNORMAL HIGH (ref 8–23)
BUN: 32 mg/dL — ABNORMAL HIGH (ref 8–23)
CO2: 25 mmol/L (ref 22–32)
CO2: 30 mmol/L (ref 22–32)
Calcium: 9.1 mg/dL (ref 8.9–10.3)
Calcium: 9.2 mg/dL (ref 8.9–10.3)
Chloride: 101 mmol/L (ref 98–111)
Chloride: 100 mmol/L (ref 98–111)
Creatinine, Ser: 0.83 mg/dL (ref 0.61–1.24)
Creatinine, Ser: 0.81 mg/dL (ref 0.61–1.24)
GFR calc Af Amer: >60 mL/min (ref >60)
GFR calc Af Amer: >60 mL/min (ref >60)
GFR calc non Af Amer: >60 mL/min (ref >60)
GFR calc non Af Amer: >60 mL/min (ref >60)
Glucose, Bld: 105 mg/dL — ABNORMAL HIGH (ref 70–99)
Glucose, Bld: 104 mg/dL — ABNORMAL HIGH (ref 70–99)
Potassium: 5.2 mmol/L — ABNORMAL HIGH (ref 3.5–5.1)
Potassium: 4.8 mmol/L (ref 3.5–5.1)
Sodium: 140 mmol/L (ref 135–145)
Sodium: 141 mmol/L (ref 135–145)

## 2019-03-18 LAB — CBC
HCT: 37.6 % — ABNORMAL LOW (ref 39.0–52.0)
Hemoglobin: 11.1 g/dL — ABNORMAL LOW (ref 13.0–17.0)
MCH: 24.6 pg — ABNORMAL LOW (ref 26.0–34.0)
MCHC: 29.5 g/dL — ABNORMAL LOW (ref 30.0–36.0)
MCV: 83.2 fL (ref 80.0–100.0)
Platelets: 262 10*3/uL (ref 150–400)
RBC: 4.52 MIL/uL (ref 4.22–5.81)
RDW: 16.7 % — ABNORMAL HIGH (ref 11.5–15.5)
WBC: 6 10*3/uL (ref 4.0–10.5)
nRBC: 0 % (ref 0.0–0.2)

## 2019-03-18 LAB — GLUCOSE, CAPILLARY
Glucose-Capillary: 104 mg/dL — ABNORMAL HIGH (ref 70–99)
Glucose-Capillary: 105 mg/dL — ABNORMAL HIGH (ref 70–99)
Glucose-Capillary: 109 mg/dL — ABNORMAL HIGH (ref 70–99)
Glucose-Capillary: 109 mg/dL — ABNORMAL HIGH (ref 70–99)
Glucose-Capillary: 112 mg/dL — ABNORMAL HIGH (ref 70–99)
Glucose-Capillary: 95 mg/dL (ref 70–99)

## 2019-03-18 NOTE — Care Management Important Message (Signed)
Important Message  Patient Details  Name: Marc [name] MRN: [id number] Date of Birth: Mar 30, 1955   Medicare Important Message Given:  Yes     [name] 03/18/2019, 12:03 PM
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
## 2019-03-18 NOTE — TOC Progression Note (Signed)
Transition of Care [facility]) - Progression Note    Patient Details  Name: Marc [name] MRN: [id number] Date of Birth: Jan 11, 1955  Transition of Care [facility]) CM/SW Contact  Eduard Roux, [place] Phone Number: 03/18/2019, 3:35 PM  Clinical Narrative:     [facility] informed [facility] - patient's referral was received and reviewed and they recommend LTAC. CSW left voice message for [facility] Jacinto Halim [phone] ext [phone] as requested by [facility] to follow up the referral.    Antony Blackbird, MSW, LCSWA Clinical Social Worker     Expected Discharge Plan: Skilled Nursing Facility Barriers to Discharge: Continued Medical Work up  Expected Discharge Plan and Services Expected Discharge Plan: Skilled Nursing Facility In-house Referral: Clinical Social Work     Living arrangements for the past 2 months: Single Family Home                                       Social Determinants of Health (SDOH) Interventions    Readmission Risk Interventions No flowsheet data found.
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
## 2019-03-19 DIAGNOSIS — I38 Endocarditis, valve unspecified: Secondary | ICD-10-CM | POA: Diagnosis present

## 2019-03-19 LAB — GLUCOSE, CAPILLARY
Glucose-Capillary: 103 mg/dL — ABNORMAL HIGH (ref 70–99)
Glucose-Capillary: 107 mg/dL — ABNORMAL HIGH (ref 70–99)
Glucose-Capillary: 110 mg/dL — ABNORMAL HIGH (ref 70–99)
Glucose-Capillary: 120 mg/dL — ABNORMAL HIGH (ref 70–99)
Glucose-Capillary: 147 mg/dL — ABNORMAL HIGH (ref 70–99)
Glucose-Capillary: 73 mg/dL (ref 70–99)

## 2019-03-19 NOTE — TOC Progression Note (Addendum)
Transition of Care [facility]) - Progression Note    Patient Details  Name: Marc [name] MRN: [id number] Date of Birth: 05-19-1955  Transition of Care [facility]) CM/SW Contact  Eduard Roux, [place] Phone Number: 03/19/2019, 1:12 PM  Clinical Narrative:     [facility] decline [facility] for [facility]ACH due to [facility] was notified within 72hrs of admission.   [facility] will review for SNF level of care and contact CSW once decision has been made.   CSW called patient's son, Marc [name]- updated on placement   CSW will continue to follow and assist with discharge planning.    Antony Blackbird, MSW, LCSWA Clinical Social Worker   Expected Discharge Plan: Skilled Nursing Facility Barriers to Discharge: Continued Medical Work up  Expected Discharge Plan and Services Expected Discharge Plan: Skilled Nursing Facility In-house Referral: Clinical Social Work     Living arrangements for the past 2 months: Single Family Home                                       Social Determinants of Health (SDOH) Interventions    Readmission Risk Interventions No flowsheet data found.
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
## 2019-03-19 NOTE — Progress Notes (Signed)
Nutrition Follow-up  DOCUMENTATION CODES:   Not applicable  INTERVENTION:  Continue Osmolite 1.5 formula via PEG at goal rate of 60 ml/hr.   Conitnue 30 ml Prostat TID per tube.   Continue 200 ml free water flushes every 4 hours. (MD to adjust as appropriate)  Tube feeding regimen to provide 2460 kcal, 135 grams of protein, and 2294 ml water.   NUTRITION DIAGNOSIS:   Increased nutrient needs related to acute illness as evidenced by estimated needs; ongoing  GOAL:   Patient will meet greater than or equal to 90% of their needs; met with TF  MONITOR:   TF tolerance, Skin, Weight trends, Labs, I & O's  REASON FOR ASSESSMENT:   Ventilator, Consult Enteral/tube feeding initiation and management  ASSESSMENT:   Patient with PMH significant for reported bipolar disease and DM. Presents this admission with acute encephalopathy from unclear etiology and severe septic shock.  1/23- trach placed 2/12- cortrak tube placed, tip of tube confirmed in stomach 2/21- trasnferred from ICU to PCU 2/22- s/p BSE- advanced to dysphagia 1 diet with honey thick liquids 2/23- cortrak d/c per MD, NPO,cortrak tube replaced- tip of tube in stomach; trasnferred back to ICU for overnight vent support 2/25- cortrak tube removed 2/26 PEG placed   Pt on trach collar during the day and vent via trach at night. Pt continues on NPO status. Pt has been tolerating his tube feeds well. RD to continue with current orders. Labs and medications reviewed.   Diet Order:   Diet Order            Diet NPO time specified  Diet effective midnight              EDUCATION NEEDS:   No education needs have been identified at this time  Skin:  Skin Assessment: Skin Integrity Issues: Skin Integrity Issues:: Stage II, Other (Comment) Stage II: R hip Other: full thickness wound at trach site  Last BM:  3/8  Height:   Ht Readings from Last 1 Encounters:  03/02/19 6' 2"  (1.88 m)    Weight:   Wt  Readings from Last 1 Encounters:  03/19/19 83.5 kg    Ideal Body Weight:  86.4 kg  BMI:  Body mass index is 23.64 kg/m.  Estimated Nutritional Needs:   Kcal:  9038-3338  Protein:  130-145 grams  Fluid:  > 2.2 L    Corrin Parker, MS, RD, LDN RD pager number/after hours weekend pager number on Amion.
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
## 2019-03-20 LAB — GLUCOSE, CAPILLARY
Glucose-Capillary: 104 mg/dL — ABNORMAL HIGH (ref 70–99)
Glucose-Capillary: 125 mg/dL — ABNORMAL HIGH (ref 70–99)
Glucose-Capillary: 92 mg/dL (ref 70–99)
Glucose-Capillary: 106 mg/dL — ABNORMAL HIGH (ref 70–99)
Glucose-Capillary: 122 mg/dL — ABNORMAL HIGH (ref 70–99)
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
## 2019-03-20 NOTE — Progress Notes (Signed)
Removed posey vest on patient in attempt to slowly trail off of restraints however pt was restless and trying to get out of bed stated "I'm looking for my wallet to go to the cookout." Explained to pt he is in a hospital and his wallet is not in the room or in his bed. Reoriented pt and kept soft wrist restraint and 4 side rails up as per order. RN will try again to trial off again later.

## 2019-03-20 NOTE — Plan of Care (Addendum)
  Problem: Safety: Goal: Ability to remain free from injury will improve Note: Re-attempted to trial pt off restraints this afternoon, pt informed use of call bell, asking for assistance, and fall prevention and verbalized he understands not to get out of bed without calling for assistance. Bed alarm on, door open and floor mats in place. Pt trying to get out of bed and stated "I'm trying to go to the floor so I can go in the kitchen." Soft restraints and four side rails remain in place. Pt reoriented to room and situation including measures staff is taking to ensure his safety.

## 2019-03-21 LAB — GLUCOSE, CAPILLARY
Glucose-Capillary: 111 mg/dL — ABNORMAL HIGH (ref 70–99)
Glucose-Capillary: 112 mg/dL — ABNORMAL HIGH (ref 70–99)
Glucose-Capillary: 114 mg/dL — ABNORMAL HIGH (ref 70–99)
Glucose-Capillary: 119 mg/dL — ABNORMAL HIGH (ref 70–99)
Glucose-Capillary: 139 mg/dL — ABNORMAL HIGH (ref 70–99)
Glucose-Capillary: 90 mg/dL (ref 70–99)

## 2019-03-21 MED ORDER — GLYCOPYRROLATE 1 MG PO TABS
1.0000 mg | ORAL_TABLET | Freq: Two times a day (BID) | ORAL | Status: DC
  Administered 2019-03-21: 1 mg
  Filled 2019-03-21 (×2): qty 1

## 2019-03-21 MED ORDER — SCOPOLAMINE 1 MG/3DAYS TD PT72
1.0000 | MEDICATED_PATCH | TRANSDERMAL | Status: DC
  Administered 2019-03-21: 1.5 mg via TRANSDERMAL

## 2019-03-21 MED ORDER — GLYCOPYRROLATE 1 MG PO TABS
1.0000 mg | ORAL_TABLET | Freq: Two times a day (BID) | ORAL | Status: DC
  Administered 2019-03-21: 1 mg via ORAL
  Filled 2019-03-21: qty 1

## 2019-03-21 MED ORDER — HALOPERIDOL LACTATE 5 MG/ML IJ SOLN: 1.0000 mg | INTRAMUSCULAR | Status: DC | PRN

## 2019-03-21 MED ORDER — HALOPERIDOL 1 MG PO TABS
1.0000 mg | ORAL_TABLET | ORAL | Status: DC | PRN
  Administered 2019-04-11 – 2019-04-17 (×2): 1 mg via ORAL
  Filled 2019-03-21 (×4): qty 1
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
## 2019-03-21 NOTE — Progress Notes (Signed)
2300: Received Recruitment consultant for patient. Restraints removed. Will continue to monitor.
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
## 2019-03-22 LAB — GLUCOSE, CAPILLARY
Glucose-Capillary: 106 mg/dL — ABNORMAL HIGH (ref 70–99)
Glucose-Capillary: 111 mg/dL — ABNORMAL HIGH (ref 70–99)
Glucose-Capillary: 117 mg/dL — ABNORMAL HIGH (ref 70–99)
Glucose-Capillary: 122 mg/dL — ABNORMAL HIGH (ref 70–99)
Glucose-Capillary: 90 mg/dL (ref 70–99)
Glucose-Capillary: 99 mg/dL (ref 70–99)

## 2019-03-22 LAB — BASIC METABOLIC PANEL
Anion gap: 11 (ref 5–15)
BUN: 30 mg/dL — ABNORMAL HIGH (ref 8–23)
CO2: 29 mmol/L (ref 22–32)
Calcium: 9.2 mg/dL (ref 8.9–10.3)
Chloride: 99 mmol/L (ref 98–111)
Creatinine, Ser: 0.73 mg/dL (ref 0.61–1.24)
GFR calc Af Amer: >60 mL/min (ref >60)
GFR calc non Af Amer: >60 mL/min (ref >60)
Glucose, Bld: 113 mg/dL — ABNORMAL HIGH (ref 70–99)
Potassium: 5.1 mmol/L (ref 3.5–5.1)
Sodium: 139 mmol/L (ref 135–145)

## 2019-03-22 LAB — CBC WITH DIFFERENTIAL/PLATELET
Abs Immature Granulocytes: 0.01 10*3/uL (ref 0.00–0.07)
Basophils Absolute: 0 10*3/uL (ref 0.0–0.1)
Basophils Relative: 0 %
Eosinophils Absolute: 0.4 10*3/uL (ref 0.0–0.5)
Eosinophils Relative: 7 %
HCT: 41.4 % (ref 39.0–52.0)
Hemoglobin: 12.5 g/dL — ABNORMAL LOW (ref 13.0–17.0)
Immature Granulocytes: 0 %
Lymphocytes Relative: 42 %
Lymphs Abs: 2.6 10*3/uL (ref 0.7–4.0)
MCH: 25.5 pg — ABNORMAL LOW (ref 26.0–34.0)
MCHC: 30.2 g/dL (ref 30.0–36.0)
MCV: 84.5 fL (ref 80.0–100.0)
Monocytes Absolute: 0.7 10*3/uL (ref 0.1–1.0)
Monocytes Relative: 11 %
Neutro Abs: 2.4 10*3/uL (ref 1.7–7.7)
Neutrophils Relative %: 40 %
Platelets: 246 10*3/uL (ref 150–400)
RBC: 4.9 MIL/uL (ref 4.22–5.81)
RDW: 16.7 % — ABNORMAL HIGH (ref 11.5–15.5)
WBC: 6.1 10*3/uL (ref 4.0–10.5)
nRBC: 0 % (ref 0.0–0.2)

## 2019-03-22 MED ORDER — HALOPERIDOL 0.5 MG PO TABS
0.5000 mg | ORAL_TABLET | Freq: Three times a day (TID) | ORAL | Status: DC
  Administered 2019-03-22 – 2019-03-23 (×4): 0.5 mg
  Filled 2019-03-22 (×5): qty 1

## 2019-03-22 MED ORDER — THIAMINE HCL 100 MG PO TABS
100.0000 mg | ORAL_TABLET | Freq: Every day | ORAL | Status: DC
  Administered 2019-03-22 – 2019-04-17 (×27): 100 mg
  Filled 2019-03-22 (×27): qty 1

## 2019-03-22 MED ORDER — ADULT MULTIVITAMIN W/MINERALS CH
1.0000 | ORAL_TABLET | Freq: Every day | ORAL | Status: DC
  Administered 2019-03-22 – 2019-04-17 (×27): 1
  Filled 2019-03-22 (×27): qty 1

## 2019-03-22 MED ORDER — TRAZODONE HCL 50 MG PO TABS
50.0000 mg | ORAL_TABLET | Freq: Every day | ORAL | Status: DC
  Administered 2019-03-22 – 2019-04-16 (×26): 50 mg
  Filled 2019-03-22 (×26): qty 1

## 2019-03-22 NOTE — Progress Notes (Signed)
RT called to bedside by RN. Pt on ATC 28% desat and heart rate increased to the 140's. Pt has thick secretions and unable to clear them and combative. Placed on vent PRVC 650/ Peep 5/ 30% FIO2/RR 15. Pt now resting comfortably SPo2 94%, HR 108, and lavaged thick tan sec from airway. RT will cont to monitor.

## 2019-03-22 NOTE — Progress Notes (Signed)
2151: Paged on-call provider about urine requesting UA and urine culture. Received order to In&Out and get UA. Will attempt after pt meds are given.   2243: Pt's breathing was labored and HR became tachy. RN suctioned and changed inner cannula which had a moderate amount of white, thick secretions. RT called as pt's O2 dropped to 92% which is unlike pt. RT placed pt back on vent and pt now resting calmly in bed.  2318: Notified on-call provider that pt was put back on vent. Will continue to monitor.   0100: Pt again agitated and attempting to get out of bed. Safety sitter was pulled. Will attempt to refrain from restraint use.   3967: Paged on-call provider to advise that restraints were reapplied.
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
## 2019-03-22 NOTE — Progress Notes (Signed)
Pink tinge blood mixed with secretions coming from trach, MD informed, RT called, trach care provided and  inner cannula changed by RT. Will continue to monitor.

## 2019-03-22 NOTE — Plan of Care (Signed)
  Problem: Safety: Goal: Ability to remain free from injury will improve Outcome: Progressing Note: Pt has sitter 1:1, still confused, agitated and intermittently trying to get out of bed, pt requiring frequent reorientation. Will continue to monitor for patient safety

## 2019-03-23 LAB — URINALYSIS, ROUTINE W REFLEX MICROSCOPIC
Bilirubin Urine: NEGATIVE
Glucose, UA: NEGATIVE mg/dL
Hgb urine dipstick: NEGATIVE
Ketones, ur: NEGATIVE mg/dL
Nitrite: POSITIVE — AB
Protein, ur: 100 mg/dL — AB
Specific Gravity, Urine: 1.02 (ref 1.005–1.030)
WBC, UA: >50 WBC/hpf — ABNORMAL HIGH (ref 0–5)
pH: 8 (ref 5.0–8.0)

## 2019-03-23 LAB — GLUCOSE, CAPILLARY
Glucose-Capillary: 112 mg/dL — ABNORMAL HIGH (ref 70–99)
Glucose-Capillary: 119 mg/dL — ABNORMAL HIGH (ref 70–99)
Glucose-Capillary: 137 mg/dL — ABNORMAL HIGH (ref 70–99)
Glucose-Capillary: 92 mg/dL (ref 70–99)
Glucose-Capillary: 97 mg/dL (ref 70–99)

## 2019-03-23 MED ORDER — POLYETHYLENE GLYCOL 3350 17 G PO PACK
17.0000 g | PACK | Freq: Every day | ORAL | Status: DC
  Administered 2019-03-23 – 2019-04-17 (×20): 17 g
  Filled 2019-03-23 (×23): qty 1

## 2019-03-23 MED ORDER — HALOPERIDOL 1 MG PO TABS
1.0000 mg | ORAL_TABLET | Freq: Three times a day (TID) | ORAL | Status: DC
  Administered 2019-03-23 – 2019-03-25 (×6): 1 mg
  Filled 2019-03-23 (×6): qty 1
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
## 2019-03-24 LAB — BASIC METABOLIC PANEL
Anion gap: 12 (ref 5–15)
BUN: 35 mg/dL — ABNORMAL HIGH (ref 8–23)
CO2: 34 mmol/L — ABNORMAL HIGH (ref 22–32)
Calcium: 9.1 mg/dL (ref 8.9–10.3)
Chloride: 92 mmol/L — ABNORMAL LOW (ref 98–111)
Creatinine, Ser: 0.81 mg/dL (ref 0.61–1.24)
GFR calc Af Amer: >60 mL/min (ref >60)
GFR calc non Af Amer: >60 mL/min (ref >60)
Glucose, Bld: 118 mg/dL — ABNORMAL HIGH (ref 70–99)
Potassium: 4.5 mmol/L (ref 3.5–5.1)
Sodium: 138 mmol/L (ref 135–145)

## 2019-03-24 LAB — GLUCOSE, CAPILLARY
Glucose-Capillary: 121 mg/dL — ABNORMAL HIGH (ref 70–99)
Glucose-Capillary: 120 mg/dL — ABNORMAL HIGH (ref 70–99)
Glucose-Capillary: 96 mg/dL (ref 70–99)
Glucose-Capillary: 82 mg/dL (ref 70–99)
Glucose-Capillary: 111 mg/dL — ABNORMAL HIGH (ref 70–99)

## 2019-03-24 MED ORDER — GLYCOPYRROLATE 1 MG PO TABS: 1.0000 mg | ORAL_TABLET | Freq: Three times a day (TID) | ORAL | Status: DC
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
## 2019-03-25 LAB — BLOOD GAS, VENOUS
Acid-Base Excess: 9.7 mmol/L — ABNORMAL HIGH (ref 0.0–2.0)
Bicarbonate: 35.4 mmol/L — ABNORMAL HIGH (ref 20.0–28.0)
FIO2: 40
O2 Saturation: 72.8 %
Patient temperature: 36.5
pCO2, Ven: 63.8 mmHg — ABNORMAL HIGH (ref 44.0–60.0)
pH, Ven: 7.359 (ref 7.250–7.430)
pO2, Ven: 42.2 mmHg (ref 32.0–45.0)

## 2019-03-25 LAB — GLUCOSE, CAPILLARY
Glucose-Capillary: 116 mg/dL — ABNORMAL HIGH (ref 70–99)
Glucose-Capillary: 119 mg/dL — ABNORMAL HIGH (ref 70–99)
Glucose-Capillary: 145 mg/dL — ABNORMAL HIGH (ref 70–99)
Glucose-Capillary: 83 mg/dL (ref 70–99)
Glucose-Capillary: 125 mg/dL — ABNORMAL HIGH (ref 70–99)
Glucose-Capillary: 88 mg/dL (ref 70–99)

## 2019-03-25 MED ORDER — HALOPERIDOL 1 MG PO TABS
1.0000 mg | ORAL_TABLET | Freq: Two times a day (BID) | ORAL | Status: DC
  Administered 2019-03-25 – 2019-04-17 (×46): 1 mg
  Filled 2019-03-25 (×47): qty 1
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
## 2019-03-25 NOTE — Progress Notes (Signed)
Upon this AM assessment, RT and RN noted that patient had bright red bloody secretions coming from trach.  Suctioned patient, due to coarse breath sounds, and obtained a copious amount of red, bloody secretions from patient.  RN will notify MD.  Also agreed that would hold on suctioning unless indicated.  Sats and vitals are stable and patient appears to be in no distress.  Will continue to monitor.
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
## 2019-03-26 LAB — BLOOD GAS, VENOUS
Acid-Base Excess: 12.8 mmol/L — ABNORMAL HIGH (ref 0.0–2.0)
Bicarbonate: 39.5 mmol/L — ABNORMAL HIGH (ref 20.0–28.0)
Drawn by: 51590
FIO2: 28
O2 Saturation: 34 %
Patient temperature: 37
pCO2, Ven: 82 mmHg (ref 44.0–60.0)
pH, Ven: 7.304 (ref 7.250–7.430)
pO2, Ven: <31 mmHg — CL (ref 32.0–45.0)

## 2019-03-26 LAB — CBC WITH DIFFERENTIAL/PLATELET
Abs Immature Granulocytes: 0.01 10*3/uL (ref 0.00–0.07)
Basophils Absolute: 0 10*3/uL (ref 0.0–0.1)
Basophils Relative: 0 %
Eosinophils Absolute: 0.5 10*3/uL (ref 0.0–0.5)
Eosinophils Relative: 9 %
HCT: 44.5 % (ref 39.0–52.0)
Hemoglobin: 12.9 g/dL — ABNORMAL LOW (ref 13.0–17.0)
Immature Granulocytes: 0 %
Lymphocytes Relative: 41 %
Lymphs Abs: 2.4 10*3/uL (ref 0.7–4.0)
MCH: 24.6 pg — ABNORMAL LOW (ref 26.0–34.0)
MCHC: 29 g/dL — ABNORMAL LOW (ref 30.0–36.0)
MCV: 84.8 fL (ref 80.0–100.0)
Monocytes Absolute: 0.5 10*3/uL (ref 0.1–1.0)
Monocytes Relative: 9 %
Neutro Abs: 2.4 10*3/uL (ref 1.7–7.7)
Neutrophils Relative %: 41 %
Platelets: 228 10*3/uL (ref 150–400)
RBC: 5.25 MIL/uL (ref 4.22–5.81)
RDW: 16.1 % — ABNORMAL HIGH (ref 11.5–15.5)
WBC: 5.9 10*3/uL (ref 4.0–10.5)
nRBC: 0 % (ref 0.0–0.2)

## 2019-03-26 LAB — GLUCOSE, CAPILLARY
Glucose-Capillary: 105 mg/dL — ABNORMAL HIGH (ref 70–99)
Glucose-Capillary: 116 mg/dL — ABNORMAL HIGH (ref 70–99)
Glucose-Capillary: 118 mg/dL — ABNORMAL HIGH (ref 70–99)
Glucose-Capillary: 120 mg/dL — ABNORMAL HIGH (ref 70–99)
Glucose-Capillary: 123 mg/dL — ABNORMAL HIGH (ref 70–99)
Glucose-Capillary: 135 mg/dL — ABNORMAL HIGH (ref 70–99)
Glucose-Capillary: 138 mg/dL — ABNORMAL HIGH (ref 70–99)
Glucose-Capillary: 142 mg/dL — ABNORMAL HIGH (ref 70–99)

## 2019-03-26 LAB — BASIC METABOLIC PANEL
Anion gap: 13 (ref 5–15)
BUN: 30 mg/dL — ABNORMAL HIGH (ref 8–23)
CO2: 31 mmol/L (ref 22–32)
Calcium: 9.1 mg/dL (ref 8.9–10.3)
Chloride: 94 mmol/L — ABNORMAL LOW (ref 98–111)
Creatinine, Ser: 0.64 mg/dL (ref 0.61–1.24)
GFR calc Af Amer: >60 mL/min (ref >60)
GFR calc non Af Amer: >60 mL/min (ref >60)
Glucose, Bld: 101 mg/dL — ABNORMAL HIGH (ref 70–99)
Potassium: 5 mmol/L (ref 3.5–5.1)
Sodium: 138 mmol/L (ref 135–145)

## 2019-03-26 LAB — URINE CULTURE: Culture: >=100000 — AB

## 2019-03-26 MED ORDER — DIPHENHYDRAMINE HCL 12.5 MG/5ML PO ELIX
25.0000 mg | ORAL_SOLUTION | Freq: Three times a day (TID) | ORAL | Status: DC | PRN
  Administered 2019-03-27 (×2): 25 mg
  Filled 2019-03-26 (×2): qty 10

## 2019-03-26 MED ORDER — OSMOLITE 1.5 CAL PO LIQD
1000.0000 mL | ORAL | Status: DC
  Administered 2019-03-26 – 2019-03-31 (×3): 1000 mL
  Filled 2019-03-26 (×13): qty 1000

## 2019-03-26 NOTE — Progress Notes (Signed)
Nutrition Follow-up  DOCUMENTATION CODES:   Not applicable  INTERVENTION:  Increase Osmolite 1.5 formula via PEG to new goal rate of 65 ml/hr.  Continue 30 ml Prostat TID per tube.  Continue 200 ml free water flushes every 4 hours. (MD to adjust as appropriate)  Tube feeding regimen to provide 2640 kcal, 143 grams of protein, and 2386 ml water.   NUTRITION DIAGNOSIS:   Increased nutrient needs related to acute illness as evidenced by estimated needs; ongoing  GOAL:   Patient will meet greater than or equal to 90% of their needs; met with TF  MONITOR:   TF tolerance, Skin, Weight trends, Labs, I & O's  REASON FOR ASSESSMENT:   Ventilator, Consult Enteral/tube feeding initiation and management  ASSESSMENT:   Patient with PMH significant for reported bipolar disease and DM. Presents this admission with acute encephalopathy from unclear etiology and severe septic shock.  1/23- trach placed 2/12- cortrak tube placed, tip of tube confirmed in stomach 2/21- trasnferred from ICU to PCU 2/22- s/p BSE- advanced to dysphagia 1 diet with honey thick liquids 2/23- cortrak d/c per MD, NPO,cortrak tube replaced- tip of tube in stomach; trasnferred back to ICU for overnight vent support 2/25- cortrak tube removed 2/26 PEG placed   Pt continues on trach collar and has been off night vent since Sunday. Pt has been tolerating his tube feeding well. Noted pt with weight loss. RD to modify tube feeding orders to aid in increased caloric and protein needs as well as in prevention of further weight loss.   Labs and medications reviewed.   Diet Order:   Diet Order            Diet NPO time specified  Diet effective midnight              EDUCATION NEEDS:   No education needs have been identified at this time  Skin:  Skin Assessment: Skin Integrity Issues: Skin Integrity Issues:: Stage II, Other (Comment) Stage II: R hip Other: full thickness wound at trach site  Last BM:   3/8  Height:   Ht Readings from Last 1 Encounters:  03/02/19 6' 2" (1.88 m)    Weight:   Wt Readings from Last 1 Encounters:  03/22/19 81 kg    Ideal Body Weight:  86.4 kg  BMI:  Body mass index is 22.93 kg/m.  Estimated Nutritional Needs:   Kcal:  0086-7619  Protein:  130-145 grams  Fluid:  > 2.2 L    Corrin Parker, MS, RD, LDN RD pager number/after hours weekend pager number on Amion.
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
## 2019-03-26 NOTE — Progress Notes (Signed)
ABG unable to be obtained x2. RN notified.
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
## 2019-03-26 NOTE — Procedures (Signed)
Tracheostomy Change Note  Patient Details:   Name: Marc [name] DOB: 11-26-1955 MRN: [id number]    Airway Documentation:     Evaluation  O2 sats: stable throughout Complications: No apparent complications Patient did tolerate procedure well. Bilateral Breath Sounds: Rhonchi     Patient trach changed from #6 cuffed shiley to #6 cuffless shiley per MD order.  Positive color change noted.  Bilateral breath sounds auscultated.  Sats currently 97%.  Vitals stable.  No complications noted.   Elyn Peers 03/26/2019, 4:06 PM

## 2019-03-26 NOTE — TOC Progression Note (Signed)
Transition of Care [facility]) - Progression Note    Patient Details  Name: TAICHI REPKA MRN: [id number] Date of Birth: 1955/11/03  [facility] CM/SW Contact  Eduard Roux, [place] Phone Number: 03/26/2019, 11:20 AM  Clinical Narrative:     CSW continues to follow.  Antony Blackbird, MSW, LCSWA Clinical Social Worker   Expected Discharge Plan: Skilled Nursing Facility Barriers to Discharge: Continued Medical Work up  Expected Discharge Plan and Services Expected Discharge Plan: Skilled Nursing Facility In-house Referral: Clinical Social Work     Living arrangements for the past 2 months: Single Family Home                                       Social Determinants of Health (SDOH) Interventions    Readmission Risk Interventions No flowsheet data found.
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
## 2019-03-27 LAB — GLUCOSE, CAPILLARY
Glucose-Capillary: 101 mg/dL — ABNORMAL HIGH (ref 70–99)
Glucose-Capillary: 107 mg/dL — ABNORMAL HIGH (ref 70–99)
Glucose-Capillary: 111 mg/dL — ABNORMAL HIGH (ref 70–99)
Glucose-Capillary: 114 mg/dL — ABNORMAL HIGH (ref 70–99)
Glucose-Capillary: 115 mg/dL — ABNORMAL HIGH (ref 70–99)
Glucose-Capillary: 116 mg/dL — ABNORMAL HIGH (ref 70–99)
Glucose-Capillary: 131 mg/dL — ABNORMAL HIGH (ref 70–99)

## 2019-03-27 NOTE — NC FL2 (Signed)
[place] LEVEL OF CARE SCREENING TOOL     IDENTIFICATION  Patient Name: Marc [name] Birthdate: [birth year] Sex: male Admission Date (Current Location): 01/21/2019  [facility] and [place] Number:  Herbalist and Address:  [facility] [place] [address] [place], [place] [address]      Provider Number: [id number]  Attending Physician Name and Address:  Tawni Millers,*  Relative Name and Phone Number:  Marc [name] [phone]    Current Level of Care: Hospital Recommended Level of Care: [place] Prior Approval Number:    Date Approved/Denied:   PASRR Number: Pending  Discharge Plan: SNF    Current Diagnoses: Patient Active Problem List   Diagnosis Date Noted  . Endocarditis 03/19/2019  . Altered mental status   . Pressure injury of skin 01/29/2019  . Staphylococcus aureus bacteremia 01/23/2019  . Bipolar 1 disorder ([place]) 01/23/2019  . Cigarette smoker 01/23/2019  . Dermatitis 01/23/2019  . Acute respiratory failure ([place])   . Encephalopathy 01/21/2019    Orientation RESPIRATION BLADDER Height & Weight     Self  Tracheostomy(Shiley 3mm uncuff , Fio2 28%) Incontinent Weight: 178 lb 9.2 oz (81 kg) Height:  6\' 2"  (188 cm)  BEHAVIORAL SYMPTOMS/MOOD NEUROLOGICAL BOWEL NUTRITION STATUS      Incontinent Diet(PEG tube- please see discharge summary)  AMBULATORY STATUS COMMUNICATION OF NEEDS Skin   Limited Assist Verbally Surgical wounds(pressure injury RT Hip Stage II, Wound Incision mid neck)                       Personal Care Assistance Level of Assistance  Bathing, Feeding, Dressing Bathing Assistance: Maximum assistance Feeding assistance: Independent Dressing Assistance: Maximum assistance     Functional Limitations Info  Sight, Hearing, Speech Sight Info: Adequate Hearing Info: Adequate Speech Info: Adequate    SPECIAL CARE FACTORS FREQUENCY  PT (By licensed PT), OT (By  licensed OT)     PT Frequency: 3x per week OT Frequency: 3x per week            Contractures Contractures Info: Not present    Additional Factors Info  Code Status, Allergies, Psychotropic Code Status Info: FULL Allergies Info: Chlorhexidine Psychotropic Info: haloperidol (HALDOL) tablet 1 mg,  QUEtiapine (SEROQUEL) tablet 25 mg         Current Medications (03/27/2019):  This is the current hospital active medication list Current Facility-Administered Medications  Medication Dose Route Frequency Provider Last Rate Last Admin  . 0.9 %  sodium chloride infusion   Intravenous PRN Chesley Mires, MD   Stopped at 03/11/19 0554  . camphor-menthol (SARNA) lotion   Topical PRN Domenic Polite, MD   Given at 03/22/19 0901  . diphenhydrAMINE (BENADRYL) 12.5 MG/5ML elixir 25 mg  25 mg Per Tube Q8H PRN Lang Snow, FNP   25 mg at 03/27/19 1022  . enoxaparin (LOVENOX) injection 40 mg  40 mg Subcutaneous Q24H Candiss Norse A, PA-C   40 mg at 03/27/19 1022  . feeding supplement (OSMOLITE 1.5 CAL) liquid 1,000 mL  1,000 mL Per Tube Continuous Arrien, Jimmy Picket, MD 65 mL/hr at 03/26/19 1558 1,000 mL at 03/26/19 1558  . feeding supplement (PRO-STAT SUGAR FREE 64) liquid 30 mL  30 mL Per Tube TID Shelly Coss, MD   30 mL at 03/27/19 1022  . folic acid (FOLVITE) tablet 1 mg  1 mg Per Tube Daily Sloan Leiter B, RPH   1 mg at 03/27/19 1022  .  free water 200 mL  200 mL Per Tube Q4H Adhikari, Amrit, MD   200 mL at 03/27/19 0800  . haloperidol (HALDOL) tablet 1 mg  1 mg Oral Q4H PRN Arrien, York Ram, MD       Or  . haloperidol lactate (HALDOL) injection 1 mg  1 mg Intramuscular Q4H PRN Arrien, York Ram, MD      . haloperidol (HALDOL) tablet 1 mg  1 mg Per Tube BID Arrien, York Ram, MD   1 mg at 03/27/19 1022  . ipratropium-albuterol (DUONEB) 0.5-2.5 (3) MG/3ML nebulizer solution 3 mL  3 mL Nebulization Q4H PRN Lorin Glass, MD      . lactulose (CHRONULAC) 10  GM/15ML solution 20 g  20 g Per Tube Daily PRN Zannie Cove, MD   20 g at 03/23/19 1054  . MEDLINE mouth rinse  15 mL Mouth Rinse 10 times per day Lorin Glass, MD   15 mL at 03/27/19 1022  . Melatonin TABS 9 mg  9 mg Per Tube QHS Zannie Cove, MD   9 mg at 03/26/19 2135  . multivitamin with minerals tablet 1 tablet  1 tablet Per Tube Daily Arrien, York Ram, MD   1 tablet at 03/27/19 1022  . ondansetron (ZOFRAN) injection 4 mg  4 mg Intravenous Q6H PRN Coralyn Helling, MD      . pantoprazole sodium (PROTONIX) 40 mg/20 mL oral suspension 40 mg  40 mg Per Tube Daily Link Snuffer B, RPH   40 mg at 03/27/19 1022  . polyethylene glycol (MIRALAX / GLYCOLAX) packet 17 g  17 g Per Tube Daily Arrien, York Ram, MD   17 g at 03/25/19 0926  . QUEtiapine (SEROQUEL) tablet 25 mg  25 mg Per Tube QHS Zannie Cove, MD   25 mg at 03/26/19 2135  . Resource ThickenUp Clear   Oral PRN Coralyn Helling, MD      . sodium chloride flush (NS) 0.9 % injection 10-40 mL  10-40 mL Intracatheter Q12H Adhikari, Amrit, MD   10 mL at 03/27/19 1025  . sodium chloride flush (NS) 0.9 % injection 10-40 mL  10-40 mL Intracatheter PRN Burnadette Pop, MD   10 mL at 03/26/19 2143  . thiamine tablet 100 mg  100 mg Per Tube Daily Arrien, York Ram, MD   100 mg at 03/27/19 1022  . traZODone (DESYREL) tablet 50 mg  50 mg Per Tube QHS Arrien, York Ram, MD   50 mg at 03/26/19 2135  . triamcinolone 0.1 % cream : eucerin cream, 1:1   Topical BID Coralyn Helling, MD   Given at 03/27/19 1023     Discharge Medications: Please see discharge summary for a list of discharge medications.  Relevant Imaging Results:  Relevant Lab Results:   Additional Information SSN # [id number]  Marc [name], LCSWA
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
## 2019-03-27 NOTE — TOC Progression Note (Signed)
Transition of Care [facility]) - Progression Note    Patient Details  Name: Marc [name] MRN: [id number] Date of Birth: [birth year]  Transition of Care [facility]) CM/SW Contact  Eduard Roux, [place] Phone Number: 03/27/2019, 4:54 PM  Clinical Narrative:     CSW faxed SNF referrals to [facility] SNF approved facilities-[facility], [facility], [facility]), [facility], [facility]- CSW waiting on response.  CSW will provide bed offers once available.   Expected Discharge Plan: Skilled Nursing Facility Barriers to Discharge: Continued Medical Work up  Expected Discharge Plan and Services Expected Discharge Plan: Skilled Nursing Facility In-house Referral: Clinical Social Work     Living arrangements for the past 2 months: Single Family Home                                       Social Determinants of Health (SDOH) Interventions    Readmission Risk Interventions No flowsheet data found.

## 2019-03-28 ENCOUNTER — Inpatient Hospital Stay (HOSPITAL_COMMUNITY): Payer: Medicare Other

## 2019-03-28 DIAGNOSIS — B3321 Viral endocarditis: Secondary | ICD-10-CM

## 2019-03-28 DIAGNOSIS — T17908A Unspecified foreign body in respiratory tract, part unspecified causing other injury, initial encounter: Secondary | ICD-10-CM

## 2019-03-28 DIAGNOSIS — Z978 Presence of other specified devices: Secondary | ICD-10-CM

## 2019-03-28 LAB — CBC
HCT: 43.5 % (ref 39.0–52.0)
Hemoglobin: 12.6 g/dL — ABNORMAL LOW (ref 13.0–17.0)
MCH: 25 pg — ABNORMAL LOW (ref 26.0–34.0)
MCHC: 29 g/dL — ABNORMAL LOW (ref 30.0–36.0)
MCV: 86.1 fL (ref 80.0–100.0)
Platelets: 206 10*3/uL (ref 150–400)
RBC: 5.05 MIL/uL (ref 4.22–5.81)
RDW: 16.2 % — ABNORMAL HIGH (ref 11.5–15.5)
WBC: 6.3 10*3/uL (ref 4.0–10.5)
nRBC: 0 % (ref 0.0–0.2)

## 2019-03-28 LAB — MAGNESIUM: Magnesium: 2.1 mg/dL (ref 1.7–2.4)

## 2019-03-28 LAB — GLUCOSE, CAPILLARY
Glucose-Capillary: 100 mg/dL — ABNORMAL HIGH (ref 70–99)
Glucose-Capillary: 95 mg/dL (ref 70–99)
Glucose-Capillary: 110 mg/dL — ABNORMAL HIGH (ref 70–99)
Glucose-Capillary: 86 mg/dL (ref 70–99)
Glucose-Capillary: 101 mg/dL — ABNORMAL HIGH (ref 70–99)

## 2019-03-28 LAB — BASIC METABOLIC PANEL
Anion gap: 13 (ref 5–15)
BUN: 25 mg/dL — ABNORMAL HIGH (ref 8–23)
CO2: 29 mmol/L (ref 22–32)
Calcium: 8.7 mg/dL — ABNORMAL LOW (ref 8.9–10.3)
Chloride: 95 mmol/L — ABNORMAL LOW (ref 98–111)
Creatinine, Ser: 0.59 mg/dL — ABNORMAL LOW (ref 0.61–1.24)
GFR calc Af Amer: >60 mL/min (ref >60)
GFR calc non Af Amer: >60 mL/min (ref >60)
Glucose, Bld: 111 mg/dL — ABNORMAL HIGH (ref 70–99)
Potassium: 5.3 mmol/L — ABNORMAL HIGH (ref 3.5–5.1)
Sodium: 137 mmol/L (ref 135–145)

## 2019-03-28 LAB — PHOSPHORUS: Phosphorus: 4.2 mg/dL (ref 2.5–4.6)

## 2019-03-28 MED ORDER — SODIUM ZIRCONIUM CYCLOSILICATE 10 G PO PACK
10.0000 g | PACK | Freq: Two times a day (BID) | ORAL | Status: AC
  Administered 2019-03-28 (×2): 10 g via ORAL
  Filled 2019-03-28 (×2): qty 1

## 2019-03-28 MED ORDER — GLYCOPYRROLATE 1 MG PO TABS
1.0000 mg | ORAL_TABLET | Freq: Two times a day (BID) | ORAL | Status: DC
  Administered 2019-03-28 – 2019-03-29 (×2): 1 mg
  Filled 2019-03-28 (×2): qty 1
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
## 2019-03-28 NOTE — Progress Notes (Signed)
Modified Barium Swallow Progress Note  Patient Details  Name: Marc [name] MRN: [id number] Date of Birth: Dec 20, 1955  Today's Date: 03/28/2019  Modified Barium Swallow completed.  Full report located under Chart Review in the Imaging Section.  Brief recommendations include the following:  Clinical Impression  Pt continues to demonstrate severe oropharyngeal dysphagia secondary to a sensory impairment. Pt tolerated PMV placement during study. Oral phase is remarkable for decreased bolus cohesion, lingual pumping and premature spillage to the vallecula and pyriform sinuses. Pharyngeal phase is remarkale for delayed swallow initiation at the pyrifrom sinuses, reduced epiglottic inversion and laryngeal closure resulting in penetration/aspiration, and residue in the vallecula and the pyriform sinuses. Pt continues to be very impulsive when self-regulating size of bolus, resulting in moderate aspiration during the swallow with absolutely no sensation. As pt kept his chin tucked down with SLP regulating the size of bolus via spoon, he was able to protect his airway better, but his risk of silent aspiration is still high and anticipated if precautions aren't strictly followed. Pt may have snacks of puree and honey-thick liquids via spoon only while chin is tucked and PMV is placed and with full supervision. Ensure the pt is fully upright, taking small spoonfuls at a time, utilizing multiple dry swallows and a throat clear in between bites and sips.   Swallow Evaluation Recommendations       SLP Diet Recommendations: Alternative means - long-term;Other (Comment)(small bites of puree or honey-thick liqiuds from floor stock)   Liquid Administration via: Spoon   Medication Administration: Crushed with puree   Supervision: Full assist for feeding;Staff to assist with self feeding;Full supervision/cueing for compensatory strategies   Compensations: Minimize environmental distractions;Slow rate;Small  sips/bites;Clear throat after each swallow;Chin tuck;Multiple dry swallows after each bite/sip   Postural Changes: Remain semi-upright after after feeds/meals (Comment);Seated upright at 90 degrees   Oral Care Recommendations: Oral care QID   Other Recommendations: Place PMSV during PO intake;Have oral suction available  Maudry Mayhew, Student SLP Office: [phone]  03/28/2019,11:25 AM

## 2019-03-29 DIAGNOSIS — L89212 Pressure ulcer of right hip, stage 2: Secondary | ICD-10-CM

## 2019-03-29 LAB — COMPREHENSIVE METABOLIC PANEL
ALT: 23 U/L (ref 0–44)
AST: 22 U/L (ref 15–41)
Albumin: 2.8 g/dL — ABNORMAL LOW (ref 3.5–5.0)
Alkaline Phosphatase: 120 U/L (ref 38–126)
Anion gap: 10 (ref 5–15)
BUN: 22 mg/dL (ref 8–23)
CO2: 33 mmol/L — ABNORMAL HIGH (ref 22–32)
Calcium: 8.7 mg/dL — ABNORMAL LOW (ref 8.9–10.3)
Chloride: 94 mmol/L — ABNORMAL LOW (ref 98–111)
Creatinine, Ser: 0.57 mg/dL — ABNORMAL LOW (ref 0.61–1.24)
GFR calc Af Amer: >60 mL/min (ref >60)
GFR calc non Af Amer: >60 mL/min (ref >60)
Glucose, Bld: 91 mg/dL (ref 70–99)
Potassium: 4.4 mmol/L (ref 3.5–5.1)
Sodium: 137 mmol/L (ref 135–145)
Total Bilirubin: 0.3 mg/dL (ref 0.3–1.2)
Total Protein: 6.7 g/dL (ref 6.5–8.1)

## 2019-03-29 LAB — CBC
HCT: 40.2 % (ref 39.0–52.0)
Hemoglobin: 11.6 g/dL — ABNORMAL LOW (ref 13.0–17.0)
MCH: 24.6 pg — ABNORMAL LOW (ref 26.0–34.0)
MCHC: 28.9 g/dL — ABNORMAL LOW (ref 30.0–36.0)
MCV: 85.4 fL (ref 80.0–100.0)
Platelets: 221 10*3/uL (ref 150–400)
RBC: 4.71 MIL/uL (ref 4.22–5.81)
RDW: 16.2 % — ABNORMAL HIGH (ref 11.5–15.5)
WBC: 4.5 10*3/uL (ref 4.0–10.5)
nRBC: 0 % (ref 0.0–0.2)

## 2019-03-29 LAB — GLUCOSE, CAPILLARY
Glucose-Capillary: 117 mg/dL — ABNORMAL HIGH (ref 70–99)
Glucose-Capillary: 118 mg/dL — ABNORMAL HIGH (ref 70–99)
Glucose-Capillary: 126 mg/dL — ABNORMAL HIGH (ref 70–99)
Glucose-Capillary: 75 mg/dL (ref 70–99)
Glucose-Capillary: 81 mg/dL (ref 70–99)
Glucose-Capillary: 93 mg/dL (ref 70–99)

## 2019-03-29 LAB — MAGNESIUM: Magnesium: 2.1 mg/dL (ref 1.7–2.4)

## 2019-03-29 LAB — PHOSPHORUS: Phosphorus: 4.4 mg/dL (ref 2.5–4.6)

## 2019-03-29 MED ORDER — GLYCOPYRROLATE 1 MG PO TABS
1.0000 mg | ORAL_TABLET | Freq: Three times a day (TID) | ORAL | Status: DC
  Administered 2019-03-29 – 2019-04-17 (×56): 1 mg
  Filled 2019-03-29 (×58): qty 1
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
## 2019-03-29 NOTE — Progress Notes (Signed)
Patient no longer has IV access. Old IV had foul-smelling purulent drainage, so RN removed it. RN tried two sticks, which were both unsuccessful. IV team has been consulted. Provider is aware that patient does not have IV access currently.

## 2019-03-29 NOTE — Plan of Care (Signed)
  Problem: Safety: Goal: Ability to remain free from injury will improve Note: Safety precautions remain in place, restraint checked and monitored every 2 hrs, Will continue to monitor.

## 2019-03-29 NOTE — TOC Progression Note (Signed)
Transition of Care [facility]) - Progression Note    Patient Details  Name: Marc [name] MRN: [id number] Date of Birth: Aug 18, 1955  [facility]) CM/SW Contact  Annalee Genta, LCSW Phone Number: 03/29/2019, 12:00 PM  Clinical Narrative: CSW spoke with attending Dr. Joseph Art about need for 30-day note. CSW noted MD inquired why SNF was being pursued versus LTAC. CSW reviewed and contacted the [place] regarding coverage and need for next steps. [facility] will need to speak with social work at the [place] and they have no weekend social work coverage. CSW will leave handoff for weekday CSW to follow up.    Expected Discharge Plan: Skilled Nursing Facility Barriers to Discharge: Continued Medical Work up  Expected Discharge Plan and Services Expected Discharge Plan: Skilled Nursing Facility In-house Referral: Clinical Social Work     Living arrangements for the past 2 months: Single Family Home                                       Social Determinants of Health (SDOH) Interventions    Readmission Risk Interventions No flowsheet data found.

## 2019-03-30 LAB — COMPREHENSIVE METABOLIC PANEL
ALT: 22 U/L (ref 0–44)
AST: 22 U/L (ref 15–41)
Albumin: 2.7 g/dL — ABNORMAL LOW (ref 3.5–5.0)
Alkaline Phosphatase: 118 U/L (ref 38–126)
Anion gap: 8 (ref 5–15)
BUN: 22 mg/dL (ref 8–23)
CO2: 36 mmol/L — ABNORMAL HIGH (ref 22–32)
Calcium: 8.9 mg/dL (ref 8.9–10.3)
Chloride: 95 mmol/L — ABNORMAL LOW (ref 98–111)
Creatinine, Ser: 0.56 mg/dL — ABNORMAL LOW (ref 0.61–1.24)
GFR calc Af Amer: >60 mL/min (ref >60)
GFR calc non Af Amer: >60 mL/min (ref >60)
Glucose, Bld: 131 mg/dL — ABNORMAL HIGH (ref 70–99)
Potassium: 4.6 mmol/L (ref 3.5–5.1)
Sodium: 139 mmol/L (ref 135–145)
Total Bilirubin: 0.3 mg/dL (ref 0.3–1.2)
Total Protein: 6.6 g/dL (ref 6.5–8.1)

## 2019-03-30 LAB — GLUCOSE, CAPILLARY
Glucose-Capillary: 104 mg/dL — ABNORMAL HIGH (ref 70–99)
Glucose-Capillary: 104 mg/dL — ABNORMAL HIGH (ref 70–99)
Glucose-Capillary: 108 mg/dL — ABNORMAL HIGH (ref 70–99)
Glucose-Capillary: 115 mg/dL — ABNORMAL HIGH (ref 70–99)
Glucose-Capillary: 133 mg/dL — ABNORMAL HIGH (ref 70–99)
Glucose-Capillary: 92 mg/dL (ref 70–99)

## 2019-03-30 LAB — CBC
HCT: 38.3 % — ABNORMAL LOW (ref 39.0–52.0)
Hemoglobin: 11.2 g/dL — ABNORMAL LOW (ref 13.0–17.0)
MCH: 25.2 pg — ABNORMAL LOW (ref 26.0–34.0)
MCHC: 29.2 g/dL — ABNORMAL LOW (ref 30.0–36.0)
MCV: 86.1 fL (ref 80.0–100.0)
Platelets: 223 10*3/uL (ref 150–400)
RBC: 4.45 MIL/uL (ref 4.22–5.81)
RDW: 16.2 % — ABNORMAL HIGH (ref 11.5–15.5)
WBC: 4.4 10*3/uL (ref 4.0–10.5)
nRBC: 0 % (ref 0.0–0.2)

## 2019-03-30 LAB — MAGNESIUM: Magnesium: 2.1 mg/dL (ref 1.7–2.4)

## 2019-03-30 LAB — PHOSPHORUS: Phosphorus: 4.4 mg/dL (ref 2.5–4.6)
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
## 2019-03-30 NOTE — Care Management (Signed)
Per Dr. Joseph Art' request, LTAC and SNF reinvestigated.  Per [facility] and per previous notes, [facility] denied LTAC stay. VA has approved SNF stay, but there will be no update until Monday when [place] is open.

## 2019-03-31 LAB — COMPREHENSIVE METABOLIC PANEL
ALT: 25 U/L (ref 0–44)
AST: 24 U/L (ref 15–41)
Albumin: 2.8 g/dL — ABNORMAL LOW (ref 3.5–5.0)
Alkaline Phosphatase: 127 U/L — ABNORMAL HIGH (ref 38–126)
Anion gap: 10 (ref 5–15)
BUN: 23 mg/dL (ref 8–23)
CO2: 34 mmol/L — ABNORMAL HIGH (ref 22–32)
Calcium: 8.8 mg/dL — ABNORMAL LOW (ref 8.9–10.3)
Chloride: 94 mmol/L — ABNORMAL LOW (ref 98–111)
Creatinine, Ser: 0.55 mg/dL — ABNORMAL LOW (ref 0.61–1.24)
GFR calc Af Amer: >60 mL/min (ref >60)
GFR calc non Af Amer: >60 mL/min (ref >60)
Glucose, Bld: 101 mg/dL — ABNORMAL HIGH (ref 70–99)
Potassium: 4.7 mmol/L (ref 3.5–5.1)
Sodium: 138 mmol/L (ref 135–145)
Total Bilirubin: 0.3 mg/dL (ref 0.3–1.2)
Total Protein: 6.8 g/dL (ref 6.5–8.1)

## 2019-03-31 LAB — CBC
HCT: 40.3 % (ref 39.0–52.0)
Hemoglobin: 11.6 g/dL — ABNORMAL LOW (ref 13.0–17.0)
MCH: 24.6 pg — ABNORMAL LOW (ref 26.0–34.0)
MCHC: 28.8 g/dL — ABNORMAL LOW (ref 30.0–36.0)
MCV: 85.4 fL (ref 80.0–100.0)
Platelets: 214 10*3/uL (ref 150–400)
RBC: 4.72 MIL/uL (ref 4.22–5.81)
RDW: 16.1 % — ABNORMAL HIGH (ref 11.5–15.5)
WBC: 4.2 10*3/uL (ref 4.0–10.5)
nRBC: 0 % (ref 0.0–0.2)

## 2019-03-31 LAB — MAGNESIUM: Magnesium: 2 mg/dL (ref 1.7–2.4)

## 2019-03-31 LAB — PHOSPHORUS: Phosphorus: 4.2 mg/dL (ref 2.5–4.6)

## 2019-03-31 LAB — GLUCOSE, CAPILLARY
Glucose-Capillary: 101 mg/dL — ABNORMAL HIGH (ref 70–99)
Glucose-Capillary: 109 mg/dL — ABNORMAL HIGH (ref 70–99)
Glucose-Capillary: 112 mg/dL — ABNORMAL HIGH (ref 70–99)
Glucose-Capillary: 120 mg/dL — ABNORMAL HIGH (ref 70–99)
Glucose-Capillary: 131 mg/dL — ABNORMAL HIGH (ref 70–99)
Glucose-Capillary: 134 mg/dL — ABNORMAL HIGH (ref 70–99)
Glucose-Capillary: 72 mg/dL (ref 70–99)

## 2019-03-31 NOTE — TOC Progression Note (Signed)
Transition of Care [facility]) - Progression Note    Patient Details  Name: Marc [name] MRN: [id number] Date of Birth: 1955-02-13  Transition of Care [facility]) CM/SW Contact  Eduard Roux, [place] Phone Number: 03/31/2019, 8:49 AM  Clinical Narrative:     VA decline approval for LTACH due to VA was NOT notified within 72hrs of admission.   VA approved 45 days SNF.  Antony Blackbird, MSW, LCSWA Clinical Social Worker   Expected Discharge Plan: Skilled Nursing Facility Barriers to Discharge: Continued Medical Work up  Expected Discharge Plan and Services Expected Discharge Plan: Skilled Nursing Facility In-house Referral: Clinical Social Work     Living arrangements for the past 2 months: Single Family Home                                       Social Determinants of Health (SDOH) Interventions    Readmission Risk Interventions No flowsheet data found.

## 2019-03-31 NOTE — TOC Progression Note (Signed)
Transition of Care [facility]) - Progression Note    Patient Details  Name: Marc [name] MRN: [id number] Date of Birth: 1955-09-30  Transition of Care [facility]) CM/SW Contact  [name], [place] Phone Number: 03/31/2019, 3:26 PM  Clinical Narrative:     CSW followed up with faxed referrals for any bed offers:  [facility]: Unable to accept patient with trach.   [facility] : Left message with Diplomatic Services operational officer, waiting call back.   [facility]: declined bed in hub due to care needs exceeding what facility can offer, lvm with admission to request they take another look.   [facility]: They report no beds and no anticipated beds avail this week.    Expected Discharge Plan: Skilled Nursing Facility Barriers to Discharge: Continued Medical Work up  Expected Discharge Plan and Services Expected Discharge Plan: Skilled Nursing Facility In-house Referral: Clinical Social Work     Living arrangements for the past 2 months: Single Family Home                                       Social Determinants of Health (SDOH) Interventions    Readmission Risk Interventions No flowsheet data found.
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
## 2019-04-01 ENCOUNTER — Inpatient Hospital Stay (HOSPITAL_COMMUNITY): Payer: Medicare Other

## 2019-04-01 DIAGNOSIS — K439 Ventral hernia without obstruction or gangrene: Secondary | ICD-10-CM | POA: Diagnosis present

## 2019-04-01 LAB — GLUCOSE, CAPILLARY
Glucose-Capillary: 121 mg/dL — ABNORMAL HIGH (ref 70–99)
Glucose-Capillary: 92 mg/dL (ref 70–99)
Glucose-Capillary: 93 mg/dL (ref 70–99)
Glucose-Capillary: 89 mg/dL (ref 70–99)
Glucose-Capillary: 61 mg/dL — ABNORMAL LOW (ref 70–99)
Glucose-Capillary: 62 mg/dL — ABNORMAL LOW (ref 70–99)
Glucose-Capillary: 77 mg/dL (ref 70–99)

## 2019-04-01 LAB — CBC
HCT: 41.9 % (ref 39.0–52.0)
Hemoglobin: 12.2 g/dL — ABNORMAL LOW (ref 13.0–17.0)
MCH: 24.6 pg — ABNORMAL LOW (ref 26.0–34.0)
MCHC: 29.1 g/dL — ABNORMAL LOW (ref 30.0–36.0)
MCV: 84.5 fL (ref 80.0–100.0)
Platelets: 212 10*3/uL (ref 150–400)
RBC: 4.96 MIL/uL (ref 4.22–5.81)
RDW: 16.4 % — ABNORMAL HIGH (ref 11.5–15.5)
WBC: 4.4 10*3/uL (ref 4.0–10.5)
nRBC: 0 % (ref 0.0–0.2)

## 2019-04-01 LAB — COMPREHENSIVE METABOLIC PANEL
ALT: 27 U/L (ref 0–44)
AST: 31 U/L (ref 15–41)
Albumin: 2.9 g/dL — ABNORMAL LOW (ref 3.5–5.0)
Alkaline Phosphatase: 126 U/L (ref 38–126)
Anion gap: 10 (ref 5–15)
BUN: 24 mg/dL — ABNORMAL HIGH (ref 8–23)
CO2: 34 mmol/L — ABNORMAL HIGH (ref 22–32)
Calcium: 9 mg/dL (ref 8.9–10.3)
Chloride: 95 mmol/L — ABNORMAL LOW (ref 98–111)
Creatinine, Ser: 0.71 mg/dL (ref 0.61–1.24)
GFR calc Af Amer: >60 mL/min (ref >60)
GFR calc non Af Amer: >60 mL/min (ref >60)
Glucose, Bld: 104 mg/dL — ABNORMAL HIGH (ref 70–99)
Potassium: 5.2 mmol/L — ABNORMAL HIGH (ref 3.5–5.1)
Sodium: 139 mmol/L (ref 135–145)
Total Bilirubin: 0.2 mg/dL — ABNORMAL LOW (ref 0.3–1.2)
Total Protein: 6.7 g/dL (ref 6.5–8.1)

## 2019-04-01 LAB — MAGNESIUM: Magnesium: 2.1 mg/dL (ref 1.7–2.4)

## 2019-04-01 LAB — PHOSPHORUS: Phosphorus: 4.5 mg/dL (ref 2.5–4.6)

## 2019-04-01 MED ORDER — IOHEXOL 300 MG/ML  SOLN
100.0000 mL | Freq: Once | INTRAMUSCULAR | Status: AC | PRN
  Administered 2019-04-01: 100 mL via INTRAVENOUS

## 2019-04-01 MED ORDER — DEXTROSE 50 % IV SOLN
INTRAVENOUS | Status: AC
  Filled 2019-04-01: qty 50

## 2019-04-01 MED ORDER — SODIUM ZIRCONIUM CYCLOSILICATE 10 G PO PACK
10.0000 g | PACK | Freq: Two times a day (BID) | ORAL | Status: DC
  Administered 2019-04-01: 10 g via ORAL
  Filled 2019-04-01 (×2): qty 1

## 2019-04-01 MED ORDER — IOHEXOL 9 MG/ML PO SOLN
ORAL | Status: AC
  Filled 2019-04-01: qty 500
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
## 2019-04-01 NOTE — Progress Notes (Signed)
2015: patient taken to CT accompanied by RN.  2025: patient returned to 4NP01 from CT
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
## 2019-04-01 NOTE — TOC Progression Note (Signed)
Transition of Care [facility]) - Progression Note    Patient Details  Name: RHYSE LOUX MRN: [id number] Date of Birth: July 04, 1955  [facility]) CM/SW Contact  Eduard Roux, [place] Phone Number: 04/01/2019, 10:05 AM  Clinical Narrative:     CSW follow up on SNF referrals:  [facility] SW [facility]- requested return call- to inquire about 1x approval for non contracted [facility] SNF.  [facility] do not accept patients with [facility]- left message w/secretary for admissions coordinator/Jessica [facility] - left voice message with admission coordinator Windell Moulding Loralee Pacas- left voice message   Antony Blackbird, MSW, LCSWA Clinical Social Worker   Expected Discharge Plan: Skilled Nursing Facility Barriers to Discharge: Continued Medical Work up  Expected Discharge Plan and Services Expected Discharge Plan: Skilled Nursing Facility In-house Referral: Clinical Social Work     Living arrangements for the past 2 months: Single Family Home                                       Social Determinants of Health (SDOH) Interventions    Readmission Risk Interventions No flowsheet data found.

## 2019-04-01 NOTE — TOC Progression Note (Addendum)
Transition of Care [facility]) - Progression Note    Patient Details  Name: Marc [name] MRN: [id number] Date of Birth: 11/25/1955  Transition [facility] Care [facility]) CM/SW Contact  Eduard Roux, [place] Phone Number: 04/01/2019, 4:01 PM  Clinical Narrative:     [facility] rehab and [place] is reviewing clinicals.  [facility] declined  PASRR remains pending  Antony Blackbird, MSW, LCSWA Clinical Social Worker   Expected Discharge Plan: Skilled Nursing Facility Barriers to Discharge: Continued Medical Work up  Expected Discharge Plan and Services Expected Discharge Plan: Skilled Nursing Facility In-house Referral: Clinical Social Work     Living arrangements for the past 2 months: Single Family Home                                       Social Determinants of Health (SDOH) Interventions    Readmission Risk Interventions No flowsheet data found.

## 2019-04-02 LAB — COMPREHENSIVE METABOLIC PANEL
ALT: 27 U/L (ref 0–44)
AST: 27 U/L (ref 15–41)
Albumin: 2.7 g/dL — ABNORMAL LOW (ref 3.5–5.0)
Alkaline Phosphatase: 119 U/L (ref 38–126)
Anion gap: 6 (ref 5–15)
BUN: 24 mg/dL — ABNORMAL HIGH (ref 8–23)
CO2: 35 mmol/L — ABNORMAL HIGH (ref 22–32)
Calcium: 8.6 mg/dL — ABNORMAL LOW (ref 8.9–10.3)
Chloride: 96 mmol/L — ABNORMAL LOW (ref 98–111)
Creatinine, Ser: 0.76 mg/dL (ref 0.61–1.24)
GFR calc Af Amer: >60 mL/min (ref >60)
GFR calc non Af Amer: >60 mL/min (ref >60)
Glucose, Bld: 105 mg/dL — ABNORMAL HIGH (ref 70–99)
Potassium: 4.4 mmol/L (ref 3.5–5.1)
Sodium: 137 mmol/L (ref 135–145)
Total Bilirubin: 0.3 mg/dL (ref 0.3–1.2)
Total Protein: 6.3 g/dL — ABNORMAL LOW (ref 6.5–8.1)

## 2019-04-02 LAB — CBC
HCT: 37.3 % — ABNORMAL LOW (ref 39.0–52.0)
Hemoglobin: 10.6 g/dL — ABNORMAL LOW (ref 13.0–17.0)
MCH: 24.7 pg — ABNORMAL LOW (ref 26.0–34.0)
MCHC: 28.4 g/dL — ABNORMAL LOW (ref 30.0–36.0)
MCV: 86.7 fL (ref 80.0–100.0)
Platelets: 196 10*3/uL (ref 150–400)
RBC: 4.3 MIL/uL (ref 4.22–5.81)
RDW: 16.7 % — ABNORMAL HIGH (ref 11.5–15.5)
WBC: 4.7 10*3/uL (ref 4.0–10.5)
nRBC: 0 % (ref 0.0–0.2)

## 2019-04-02 LAB — URINALYSIS, ROUTINE W REFLEX MICROSCOPIC
Bilirubin Urine: NEGATIVE
Glucose, UA: NEGATIVE mg/dL
Hgb urine dipstick: NEGATIVE
Ketones, ur: NEGATIVE mg/dL
Nitrite: NEGATIVE
Protein, ur: NEGATIVE mg/dL
Specific Gravity, Urine: 1.018 (ref 1.005–1.030)
WBC, UA: >50 WBC/hpf — ABNORMAL HIGH (ref 0–5)
pH: 8 (ref 5.0–8.0)

## 2019-04-02 LAB — GLUCOSE, CAPILLARY
Glucose-Capillary: 114 mg/dL — ABNORMAL HIGH (ref 70–99)
Glucose-Capillary: 118 mg/dL — ABNORMAL HIGH (ref 70–99)
Glucose-Capillary: 118 mg/dL — ABNORMAL HIGH (ref 70–99)
Glucose-Capillary: 67 mg/dL — ABNORMAL LOW (ref 70–99)
Glucose-Capillary: 90 mg/dL (ref 70–99)
Glucose-Capillary: 90 mg/dL (ref 70–99)

## 2019-04-02 LAB — PHOSPHORUS: Phosphorus: 4.4 mg/dL (ref 2.5–4.6)

## 2019-04-02 LAB — MAGNESIUM: Magnesium: 2 mg/dL (ref 1.7–2.4)

## 2019-04-02 MED ORDER — SODIUM CHLORIDE 0.9 % IV SOLN
1.0000 g | INTRAVENOUS | Status: AC
  Administered 2019-04-02 – 2019-04-08 (×7): 1 g via INTRAVENOUS
  Filled 2019-04-02 (×7): qty 1

## 2019-04-02 MED ORDER — SODIUM ZIRCONIUM CYCLOSILICATE 10 G PO PACK
10.0000 g | PACK | Freq: Two times a day (BID) | ORAL | Status: AC
  Administered 2019-04-02: 10 g via ORAL
  Filled 2019-04-02: qty 1

## 2019-04-02 NOTE — Progress Notes (Signed)
Nutrition Follow-up  DOCUMENTATION CODES:   Not applicable  INTERVENTION:  Continue Osmolite 1.5 formula via PEG at goal rate of 65 ml/hr.  Continue 30 ml Prostat TID per tube.  Continue 200 ml free water flushes every 4 hours. (MD to adjust as appropriate)  Tube feeding regimen to provide 2640 kcal, 143 grams of protein, and 2386 ml water.   NUTRITION DIAGNOSIS:   Increased nutrient needs related to acute illness as evidenced by estimated needs; ongoing  GOAL:   Patient will meet greater than or equal to 90% of their needs; met with TF  MONITOR:   TF tolerance, Skin, Weight trends, Labs, I & O's  REASON FOR ASSESSMENT:   Ventilator, Consult Enteral/tube feeding initiation and management  ASSESSMENT:   Patient with PMH significant for reported bipolar disease and DM. Presents this admission with acute encephalopathy from unclear etiology and severe septic shock.  1/23- trach placed 2/26 PEG placed    Pt continues on trach collar. Pt NPO with the exception of purees/honey thick liquids from floor stock as appropriate with strict precautions or pt will aspirate per SLP. Pt continues on tube feeding to provide adequate nutrition. RD to continue with current orders. Labs and medications reviewed.   Diet Order:   Diet Order            Diet NPO time specified Except for: Other (See Comments)  Diet effective midnight              EDUCATION NEEDS:   No education needs have been identified at this time  Skin:  Skin Assessment: Reviewed RN Assessment Skin Integrity Issues:: Stage II, Other (Comment) Stage II: N/A Other: N/A  Last BM:  3/24  Height:   Ht Readings from Last 1 Encounters:  03/02/19 6' 2"  (1.88 m)    Weight:   Wt Readings from Last 1 Encounters:  04/02/19 84.3 kg    Ideal Body Weight:  86.4 kg  BMI:  Body mass index is 23.86 kg/m.  Estimated Nutritional Needs:   Kcal:  0321-2248  Protein:  130-145 grams  Fluid:  > 2.2  L   Corrin Parker, MS, RD, LDN RD pager number/after hours weekend pager number on Amion.
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
## 2019-04-02 NOTE — Progress Notes (Deleted)
Jane with Radiology called report on CXR:  Small right apical pneumothorax slightly worse from 3/21.  Hosie Spangle, PA advised.

## 2019-04-02 NOTE — TOC Progression Note (Signed)
Transition of Care [facility]) - Progression Note    Patient Details  Name: Marc [name] MRN: [id number] Date of Birth: [birth year]  [facility]) CM/SW Contact  Eduard Roux, [place] Phone Number: 04/02/2019, 9:50 AM  Clinical Narrative:     Patient has no bed offers- [facility] declined [place] - waiting on decision.  Antony Blackbird, MSW, LCSWA Clinical Social Worker    Expected Discharge Plan: Skilled Nursing Facility Barriers to Discharge: Continued Medical Work up  Expected Discharge Plan and Services Expected Discharge Plan: Skilled Nursing Facility In-house Referral: Clinical Social Work     Living arrangements for the past 2 months: Single Family Home                                       Social Determinants of Health (SDOH) Interventions    Readmission Risk Interventions No flowsheet data found.

## 2019-04-03 LAB — COMPREHENSIVE METABOLIC PANEL
ALT: 27 U/L (ref 0–44)
AST: 26 U/L (ref 15–41)
Albumin: 2.8 g/dL — ABNORMAL LOW (ref 3.5–5.0)
Alkaline Phosphatase: 127 U/L — ABNORMAL HIGH (ref 38–126)
Anion gap: 8 (ref 5–15)
BUN: 24 mg/dL — ABNORMAL HIGH (ref 8–23)
CO2: 33 mmol/L — ABNORMAL HIGH (ref 22–32)
Calcium: 8.6 mg/dL — ABNORMAL LOW (ref 8.9–10.3)
Chloride: 97 mmol/L — ABNORMAL LOW (ref 98–111)
Creatinine, Ser: 0.67 mg/dL (ref 0.61–1.24)
GFR calc Af Amer: >60 mL/min (ref >60)
GFR calc non Af Amer: >60 mL/min (ref >60)
Glucose, Bld: 102 mg/dL — ABNORMAL HIGH (ref 70–99)
Potassium: 4.5 mmol/L (ref 3.5–5.1)
Sodium: 138 mmol/L (ref 135–145)
Total Bilirubin: 0.3 mg/dL (ref 0.3–1.2)
Total Protein: 6.7 g/dL (ref 6.5–8.1)

## 2019-04-03 LAB — CBC
HCT: 40 % (ref 39.0–52.0)
Hemoglobin: 11.7 g/dL — ABNORMAL LOW (ref 13.0–17.0)
MCH: 25 pg — ABNORMAL LOW (ref 26.0–34.0)
MCHC: 29.3 g/dL — ABNORMAL LOW (ref 30.0–36.0)
MCV: 85.5 fL (ref 80.0–100.0)
Platelets: 188 10*3/uL (ref 150–400)
RBC: 4.68 MIL/uL (ref 4.22–5.81)
RDW: 16.8 % — ABNORMAL HIGH (ref 11.5–15.5)
WBC: 4.9 10*3/uL (ref 4.0–10.5)
nRBC: 0 % (ref 0.0–0.2)

## 2019-04-03 LAB — TSH: TSH: 4.26 u[IU]/mL (ref 0.350–4.500)

## 2019-04-03 LAB — GLUCOSE, CAPILLARY
Glucose-Capillary: 115 mg/dL — ABNORMAL HIGH (ref 70–99)
Glucose-Capillary: 122 mg/dL — ABNORMAL HIGH (ref 70–99)
Glucose-Capillary: 124 mg/dL — ABNORMAL HIGH (ref 70–99)
Glucose-Capillary: 131 mg/dL — ABNORMAL HIGH (ref 70–99)
Glucose-Capillary: 141 mg/dL — ABNORMAL HIGH (ref 70–99)
Glucose-Capillary: 45 mg/dL — ABNORMAL LOW (ref 70–99)
Glucose-Capillary: 53 mg/dL — ABNORMAL LOW (ref 70–99)
Glucose-Capillary: 71 mg/dL (ref 70–99)
Glucose-Capillary: 76 mg/dL (ref 70–99)
Glucose-Capillary: 97 mg/dL (ref 70–99)
Glucose-Capillary: 99 mg/dL (ref 70–99)

## 2019-04-03 LAB — MAGNESIUM: Magnesium: 2 mg/dL (ref 1.7–2.4)

## 2019-04-03 LAB — CORTISOL: Cortisol, Plasma: 8.6 ug/dL

## 2019-04-03 LAB — PHOSPHORUS: Phosphorus: 4.3 mg/dL (ref 2.5–4.6)

## 2019-04-03 MED ORDER — JEVITY 1.5 CAL/FIBER PO LIQD
1000.0000 mL | ORAL | Status: DC
  Administered 2019-04-03 – 2019-04-15 (×9): 1000 mL via ORAL
  Filled 2019-04-03 (×25): qty 1000

## 2019-04-03 MED ORDER — DEXTROSE IN LACTATED RINGERS 5 % IV SOLN: INTRAVENOUS | Status: AC

## 2019-04-03 MED ORDER — DEXTROSE 50 % IV SOLN
INTRAVENOUS | Status: AC
  Filled 2019-04-03: qty 50

## 2019-04-03 NOTE — Progress Notes (Signed)
Nutrition Follow-up  DOCUMENTATION CODES:   Not applicable  INTERVENTION:  Discontinue Osmolite 1.5 formula.   Initiate Jevity 1.5 formula via PEG at starting volume of 35 ml/hr and increase by 10 ml every 4 hours to goal rate of 65 ml/hr.   Continue 30 ml Prostat TID per tube.  Continue 200 ml free water flushes every 4 hours. (MD to adjust as appropriate)  Tube feeding regimen to provide 2640 kcal, 145 grams of protein, and 2386 ml water.   NUTRITION DIAGNOSIS:   Increased nutrient needs related to acute illness as evidenced by estimated needs; ongoing  GOAL:   Patient will meet greater than or equal to 90% of their needs; met with TF  MONITOR:   TF tolerance, Skin, Weight trends, Labs, I & O's  REASON FOR ASSESSMENT:   Ventilator, Consult Enteral/tube feeding initiation and management  ASSESSMENT:   Patient with PMH significant for reported bipolar disease and DM. Presents this admission with acute encephalopathy from unclear etiology and severe septic shock.  1/23- trach placed 2/26 PEG placed    Pt continues on trach collar. Pt NPO with the exception of purees/honey thick liquids from floor stock as appropriate with strict precautions or pt will aspirate per SLP. Pt with hypoglycemia event yesterday and earlier today. Noted RN observed port on end of PEG was open for an unknown amount of time leading to tube feeding leaking out today, however unsure if hypoglycemia event yesterday was due to this same situation. MD contacted RD regarding recommendations to prevent any future potential hypoglycemia. RD to change formula to Jevity 1.5 which is a fiber containing formula, which will likely aid in maintaining blood glucose control and prevent hypoglycemia.   Labs and medications reviewed.   Diet Order:   Diet Order            Diet NPO time specified Except for: Other (See Comments)  Diet effective midnight              EDUCATION NEEDS:   No education needs  have been identified at this time  Skin:  Skin Assessment: Reviewed RN Assessment Skin Integrity Issues:: Stage II, Other (Comment) Stage II: N/A Other: N/A  Last BM:  3/24  Height:   Ht Readings from Last 1 Encounters:  03/02/19 _0  (1.88 m)    Weight:   Wt Readings from Last 1 Encounters:  04/02/19 84.3 kg    BMI:  Body mass index is 23.86 kg/m.  Estimated Nutritional Needs:   Kcal:  6147-0929  Protein:  130-145 grams  Fluid:  > 2.2 L   Corrin Parker, MS, RD, LDN RD pager number/after hours weekend pager number on Amion.

## 2019-04-03 NOTE — Progress Notes (Signed)
Paged MD about pts temp 94.4 rectal. Bear hugger applied and CBG rechecked.
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
## 2019-04-03 NOTE — Progress Notes (Addendum)
Hypoglycemic Event  CBG: 53  Treatment: 25g D50  Symptoms: none  Follow-up CBG: Time:1215 CBG Result:131  Possible Reasons for Event: port on end of PEG was open for an unknown amount of time leading to tube feeding leaking out  Comments/MD notified:    [employer]

## 2019-04-03 NOTE — Care Management Important Message (Signed)
Important Message  Patient Details  Name: Marc [name] MRN: [id number] Date of Birth: July 28, 1955   Medicare Important Message Given:  Yes     [name] 04/03/2019, 3:56 PM

## 2019-04-04 LAB — COMPREHENSIVE METABOLIC PANEL
ALT: 37 U/L (ref 0–44)
AST: 37 U/L (ref 15–41)
Albumin: 2.7 g/dL — ABNORMAL LOW (ref 3.5–5.0)
Alkaline Phosphatase: 130 U/L — ABNORMAL HIGH (ref 38–126)
Anion gap: 10 (ref 5–15)
BUN: 25 mg/dL — ABNORMAL HIGH (ref 8–23)
CO2: 33 mmol/L — ABNORMAL HIGH (ref 22–32)
Calcium: 8.6 mg/dL — ABNORMAL LOW (ref 8.9–10.3)
Chloride: 97 mmol/L — ABNORMAL LOW (ref 98–111)
Creatinine, Ser: 0.8 mg/dL (ref 0.61–1.24)
GFR calc Af Amer: >60 mL/min (ref >60)
GFR calc non Af Amer: >60 mL/min (ref >60)
Glucose, Bld: 120 mg/dL — ABNORMAL HIGH (ref 70–99)
Potassium: 4.8 mmol/L (ref 3.5–5.1)
Sodium: 140 mmol/L (ref 135–145)
Total Bilirubin: 0.4 mg/dL (ref 0.3–1.2)
Total Protein: 6.5 g/dL (ref 6.5–8.1)

## 2019-04-04 LAB — CBC
HCT: 39.2 % (ref 39.0–52.0)
Hemoglobin: 11.6 g/dL — ABNORMAL LOW (ref 13.0–17.0)
MCH: 25.2 pg — ABNORMAL LOW (ref 26.0–34.0)
MCHC: 29.6 g/dL — ABNORMAL LOW (ref 30.0–36.0)
MCV: 85 fL (ref 80.0–100.0)
Platelets: 195 10*3/uL (ref 150–400)
RBC: 4.61 MIL/uL (ref 4.22–5.81)
RDW: 17 % — ABNORMAL HIGH (ref 11.5–15.5)
WBC: 4.2 10*3/uL (ref 4.0–10.5)
nRBC: 0 % (ref 0.0–0.2)

## 2019-04-04 LAB — GLUCOSE, CAPILLARY
Glucose-Capillary: 104 mg/dL — ABNORMAL HIGH (ref 70–99)
Glucose-Capillary: 117 mg/dL — ABNORMAL HIGH (ref 70–99)
Glucose-Capillary: 128 mg/dL — ABNORMAL HIGH (ref 70–99)
Glucose-Capillary: 87 mg/dL (ref 70–99)
Glucose-Capillary: 91 mg/dL (ref 70–99)
Glucose-Capillary: 97 mg/dL (ref 70–99)

## 2019-04-04 LAB — URINE CULTURE: Culture: >=100000 — AB

## 2019-04-04 LAB — MAGNESIUM: Magnesium: 1.9 mg/dL (ref 1.7–2.4)

## 2019-04-04 LAB — MRSA PCR SCREENING: MRSA by PCR: NEGATIVE

## 2019-04-04 LAB — CORTISOL: Cortisol, Plasma: 11.7 ug/dL

## 2019-04-04 LAB — PHOSPHORUS: Phosphorus: 4.2 mg/dL (ref 2.5–4.6)

## 2019-04-04 MED ORDER — COSYNTROPIN 0.25 MG IJ SOLR
0.2500 mg | Freq: Once | INTRAMUSCULAR | Status: AC
  Administered 2019-04-05: 07:00:00 0.25 mg via INTRAVENOUS
  Filled 2019-04-04: qty 0.25
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
## 2019-04-05 LAB — COMPREHENSIVE METABOLIC PANEL
ALT: 48 U/L — ABNORMAL HIGH (ref 0–44)
AST: 42 U/L — ABNORMAL HIGH (ref 15–41)
Albumin: 2.8 g/dL — ABNORMAL LOW (ref 3.5–5.0)
Alkaline Phosphatase: 122 U/L (ref 38–126)
Anion gap: 8 (ref 5–15)
BUN: 22 mg/dL (ref 8–23)
CO2: 32 mmol/L (ref 22–32)
Calcium: 8.8 mg/dL — ABNORMAL LOW (ref 8.9–10.3)
Chloride: 100 mmol/L (ref 98–111)
Creatinine, Ser: 0.66 mg/dL (ref 0.61–1.24)
GFR calc Af Amer: >60 mL/min (ref >60)
GFR calc non Af Amer: >60 mL/min (ref >60)
Glucose, Bld: 91 mg/dL (ref 70–99)
Potassium: 5 mmol/L (ref 3.5–5.1)
Sodium: 140 mmol/L (ref 135–145)
Total Bilirubin: 0.3 mg/dL (ref 0.3–1.2)
Total Protein: 6.6 g/dL (ref 6.5–8.1)

## 2019-04-05 LAB — CBC
HCT: 40.8 % (ref 39.0–52.0)
Hemoglobin: 11.8 g/dL — ABNORMAL LOW (ref 13.0–17.0)
MCH: 24.7 pg — ABNORMAL LOW (ref 26.0–34.0)
MCHC: 28.9 g/dL — ABNORMAL LOW (ref 30.0–36.0)
MCV: 85.5 fL (ref 80.0–100.0)
Platelets: 180 10*3/uL (ref 150–400)
RBC: 4.77 MIL/uL (ref 4.22–5.81)
RDW: 17.1 % — ABNORMAL HIGH (ref 11.5–15.5)
WBC: 4.7 10*3/uL (ref 4.0–10.5)
nRBC: 0 % (ref 0.0–0.2)

## 2019-04-05 LAB — PHOSPHORUS: Phosphorus: 4.6 mg/dL (ref 2.5–4.6)

## 2019-04-05 LAB — MAGNESIUM: Magnesium: 1.9 mg/dL (ref 1.7–2.4)

## 2019-04-05 LAB — ACTH STIMULATION, 3 TIME POINTS
Cortisol, 30 Min: 19.1 ug/dL
Cortisol, 60 Min: 19.1 ug/dL
Cortisol, Base: 11.9 ug/dL

## 2019-04-05 LAB — GLUCOSE, CAPILLARY
Glucose-Capillary: 87 mg/dL (ref 70–99)
Glucose-Capillary: 125 mg/dL — ABNORMAL HIGH (ref 70–99)
Glucose-Capillary: 97 mg/dL (ref 70–99)
Glucose-Capillary: 96 mg/dL (ref 70–99)
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
## 2019-04-06 LAB — GLUCOSE, CAPILLARY
Glucose-Capillary: 103 mg/dL — ABNORMAL HIGH (ref 70–99)
Glucose-Capillary: 106 mg/dL — ABNORMAL HIGH (ref 70–99)
Glucose-Capillary: 127 mg/dL — ABNORMAL HIGH (ref 70–99)
Glucose-Capillary: 80 mg/dL (ref 70–99)
Glucose-Capillary: 86 mg/dL (ref 70–99)
Glucose-Capillary: 94 mg/dL (ref 70–99)

## 2019-04-06 LAB — COMPREHENSIVE METABOLIC PANEL
ALT: 37 U/L (ref 0–44)
AST: 29 U/L (ref 15–41)
Albumin: 2.6 g/dL — ABNORMAL LOW (ref 3.5–5.0)
Alkaline Phosphatase: 111 U/L (ref 38–126)
Anion gap: 8 (ref 5–15)
BUN: 23 mg/dL (ref 8–23)
CO2: 31 mmol/L (ref 22–32)
Calcium: 8.8 mg/dL — ABNORMAL LOW (ref 8.9–10.3)
Chloride: 101 mmol/L (ref 98–111)
Creatinine, Ser: 0.61 mg/dL (ref 0.61–1.24)
GFR calc Af Amer: >60 mL/min (ref >60)
GFR calc non Af Amer: >60 mL/min (ref >60)
Glucose, Bld: 124 mg/dL — ABNORMAL HIGH (ref 70–99)
Potassium: 4.8 mmol/L (ref 3.5–5.1)
Sodium: 140 mmol/L (ref 135–145)
Total Bilirubin: 0.6 mg/dL (ref 0.3–1.2)
Total Protein: 5.9 g/dL — ABNORMAL LOW (ref 6.5–8.1)

## 2019-04-06 LAB — CBC
HCT: 38.8 % — ABNORMAL LOW (ref 39.0–52.0)
Hemoglobin: 11.3 g/dL — ABNORMAL LOW (ref 13.0–17.0)
MCH: 25.3 pg — ABNORMAL LOW (ref 26.0–34.0)
MCHC: 29.1 g/dL — ABNORMAL LOW (ref 30.0–36.0)
MCV: 86.8 fL (ref 80.0–100.0)
Platelets: 164 10*3/uL (ref 150–400)
RBC: 4.47 MIL/uL (ref 4.22–5.81)
RDW: 17.1 % — ABNORMAL HIGH (ref 11.5–15.5)
WBC: 4.2 10*3/uL (ref 4.0–10.5)
nRBC: 0 % (ref 0.0–0.2)

## 2019-04-06 LAB — PHOSPHORUS: Phosphorus: 4.7 mg/dL — ABNORMAL HIGH (ref 2.5–4.6)

## 2019-04-06 LAB — MAGNESIUM: Magnesium: 1.9 mg/dL (ref 1.7–2.4)
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
## 2019-04-07 LAB — CBC WITH DIFFERENTIAL/PLATELET
Abs Immature Granulocytes: 0.01 10*3/uL (ref 0.00–0.07)
Basophils Absolute: 0 10*3/uL (ref 0.0–0.1)
Basophils Relative: 1 %
Eosinophils Absolute: 0.5 10*3/uL (ref 0.0–0.5)
Eosinophils Relative: 11 %
HCT: 42 % (ref 39.0–52.0)
Hemoglobin: 11.9 g/dL — ABNORMAL LOW (ref 13.0–17.0)
Immature Granulocytes: 0 %
Lymphocytes Relative: 41 %
Lymphs Abs: 1.6 10*3/uL (ref 0.7–4.0)
MCH: 24.7 pg — ABNORMAL LOW (ref 26.0–34.0)
MCHC: 28.3 g/dL — ABNORMAL LOW (ref 30.0–36.0)
MCV: 87.3 fL (ref 80.0–100.0)
Monocytes Absolute: 0.4 10*3/uL (ref 0.1–1.0)
Monocytes Relative: 11 %
Neutro Abs: 1.4 10*3/uL — ABNORMAL LOW (ref 1.7–7.7)
Neutrophils Relative %: 36 %
Platelets: 166 10*3/uL (ref 150–400)
RBC: 4.81 MIL/uL (ref 4.22–5.81)
RDW: 17.2 % — ABNORMAL HIGH (ref 11.5–15.5)
WBC: 4 10*3/uL (ref 4.0–10.5)
nRBC: 0 % (ref 0.0–0.2)

## 2019-04-07 LAB — COMPREHENSIVE METABOLIC PANEL
ALT: 32 U/L (ref 0–44)
AST: 26 U/L (ref 15–41)
Albumin: 2.8 g/dL — ABNORMAL LOW (ref 3.5–5.0)
Alkaline Phosphatase: 109 U/L (ref 38–126)
Anion gap: 12 (ref 5–15)
BUN: 26 mg/dL — ABNORMAL HIGH (ref 8–23)
CO2: 32 mmol/L (ref 22–32)
Calcium: 8.9 mg/dL (ref 8.9–10.3)
Chloride: 97 mmol/L — ABNORMAL LOW (ref 98–111)
Creatinine, Ser: 0.68 mg/dL (ref 0.61–1.24)
GFR calc Af Amer: >60 mL/min (ref >60)
GFR calc non Af Amer: >60 mL/min (ref >60)
Glucose, Bld: 94 mg/dL (ref 70–99)
Potassium: 4.9 mmol/L (ref 3.5–5.1)
Sodium: 141 mmol/L (ref 135–145)
Total Bilirubin: 0.5 mg/dL (ref 0.3–1.2)
Total Protein: 6.4 g/dL — ABNORMAL LOW (ref 6.5–8.1)

## 2019-04-07 LAB — PHOSPHORUS: Phosphorus: 4.8 mg/dL — ABNORMAL HIGH (ref 2.5–4.6)

## 2019-04-07 LAB — GLUCOSE, CAPILLARY
Glucose-Capillary: 101 mg/dL — ABNORMAL HIGH (ref 70–99)
Glucose-Capillary: 103 mg/dL — ABNORMAL HIGH (ref 70–99)
Glucose-Capillary: 118 mg/dL — ABNORMAL HIGH (ref 70–99)
Glucose-Capillary: 122 mg/dL — ABNORMAL HIGH (ref 70–99)
Glucose-Capillary: 73 mg/dL (ref 70–99)
Glucose-Capillary: 79 mg/dL (ref 70–99)
Glucose-Capillary: 91 mg/dL (ref 70–99)
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
## 2019-04-07 NOTE — TOC Progression Note (Addendum)
Transition of Care [facility]) - Progression Note    Patient Details  Name: Marc [name] MRN: [id number] Date of Birth: Mar 01, 1955  Transition of Care [facility]) CM/SW [place], [place] Phone Number: 04/07/2019, 2:27 PM  Clinical Narrative:     CSW spoke with Maudie Mercury, [facility] Admissions Director - she is sending referral to [facility] has declined but she will  have them to re-review to determine if they are a good fit and if they can met the patient's needs.  Kim, will follow up with CSW once decision has been made.  Thurmond Butts, MSW, [place] Clinical Social Worker   Expected Discharge Plan: Skilled Nursing Facility Barriers to Discharge: Continued Medical Work up  Expected Discharge Plan and Services Expected Discharge Plan: [place] In-house Referral: Clinical Social Work     Living arrangements for the past 2 months: Single Family Home                                       Social Determinants of Health (SDOH) Interventions    Readmission Risk Interventions No flowsheet data found.
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
## 2019-04-07 NOTE — TOC Progression Note (Signed)
Transition of Care [facility]) - Progression Note    Patient Details  Name: Marc [name] MRN: [id number] Date of Birth: 09/23/1955  [facility] CM/SW Contact  Eduard Roux, [place] Phone Number: 04/07/2019, 11:13 AM  Clinical Narrative:     Patient has no bed offers.   [facility] contacted [facility]CSW inquired about possible 1x approval of SNF outside of [place] contract- [place] SW [place] informed CSW they can only approve [facility] contracted SNFs. [facility] SW states she will look into other [place] SNFs, that is further out and accepts Trach patients and will contact CSW with more referrals.   CSW contacted [facility], Selena Batten- unable to leave VM- mail box was full- CSW will try and contact later today.    CSW will continue to follow and actively seek SNF placement.   Antony Blackbird, MSW, LCSWA Clinical Social Worker   Expected Discharge Plan: Skilled Nursing Facility Barriers to Discharge: Continued Medical Work up  Expected Discharge Plan and Services Expected Discharge Plan: Skilled Nursing Facility In-house Referral: Clinical Social Work     Living arrangements for the past 2 months: Single Family Home                                       Social Determinants of Health (SDOH) Interventions    Readmission Risk Interventions No flowsheet data found.
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
## 2019-04-08 LAB — CBC WITH DIFFERENTIAL/PLATELET
Abs Immature Granulocytes: 0.01 10*3/uL (ref 0.00–0.07)
Basophils Absolute: 0 10*3/uL (ref 0.0–0.1)
Basophils Relative: 1 %
Eosinophils Absolute: 0.4 10*3/uL (ref 0.0–0.5)
Eosinophils Relative: 9 %
HCT: 40.9 % (ref 39.0–52.0)
Hemoglobin: 11.7 g/dL — ABNORMAL LOW (ref 13.0–17.0)
Immature Granulocytes: 0 %
Lymphocytes Relative: 42 %
Lymphs Abs: 1.7 10*3/uL (ref 0.7–4.0)
MCH: 25.1 pg — ABNORMAL LOW (ref 26.0–34.0)
MCHC: 28.6 g/dL — ABNORMAL LOW (ref 30.0–36.0)
MCV: 87.6 fL (ref 80.0–100.0)
Monocytes Absolute: 0.5 10*3/uL (ref 0.1–1.0)
Monocytes Relative: 11 %
Neutro Abs: 1.5 10*3/uL — ABNORMAL LOW (ref 1.7–7.7)
Neutrophils Relative %: 37 %
Platelets: 155 10*3/uL (ref 150–400)
RBC: 4.67 MIL/uL (ref 4.22–5.81)
RDW: 17.2 % — ABNORMAL HIGH (ref 11.5–15.5)
WBC: 4 10*3/uL (ref 4.0–10.5)
nRBC: 0 % (ref 0.0–0.2)

## 2019-04-08 LAB — COMPREHENSIVE METABOLIC PANEL
ALT: 29 U/L (ref 0–44)
AST: 23 U/L (ref 15–41)
Albumin: 2.8 g/dL — ABNORMAL LOW (ref 3.5–5.0)
Alkaline Phosphatase: 119 U/L (ref 38–126)
Anion gap: 10 (ref 5–15)
BUN: 25 mg/dL — ABNORMAL HIGH (ref 8–23)
CO2: 35 mmol/L — ABNORMAL HIGH (ref 22–32)
Calcium: 8.8 mg/dL — ABNORMAL LOW (ref 8.9–10.3)
Chloride: 95 mmol/L — ABNORMAL LOW (ref 98–111)
Creatinine, Ser: 0.65 mg/dL (ref 0.61–1.24)
GFR calc Af Amer: >60 mL/min (ref >60)
GFR calc non Af Amer: >60 mL/min (ref >60)
Glucose, Bld: 98 mg/dL (ref 70–99)
Potassium: 4.5 mmol/L (ref 3.5–5.1)
Sodium: 140 mmol/L (ref 135–145)
Total Bilirubin: 0.3 mg/dL (ref 0.3–1.2)
Total Protein: 6.3 g/dL — ABNORMAL LOW (ref 6.5–8.1)

## 2019-04-08 LAB — GLUCOSE, CAPILLARY
Glucose-Capillary: 104 mg/dL — ABNORMAL HIGH (ref 70–99)
Glucose-Capillary: 105 mg/dL — ABNORMAL HIGH (ref 70–99)
Glucose-Capillary: 106 mg/dL — ABNORMAL HIGH (ref 70–99)
Glucose-Capillary: 87 mg/dL (ref 70–99)
Glucose-Capillary: 89 mg/dL (ref 70–99)
Glucose-Capillary: 90 mg/dL (ref 70–99)

## 2019-04-08 LAB — MAGNESIUM: Magnesium: 2 mg/dL (ref 1.7–2.4)

## 2019-04-08 LAB — PHOSPHORUS: Phosphorus: 4.5 mg/dL (ref 2.5–4.6)
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
## 2019-04-08 NOTE — TOC Progression Note (Signed)
Transition of Care [facility]) - Progression Note    Patient Details  Name: TYWON NIDAY MRN: [id number] Date of Birth: July 25, 1955  Transition of Care [facility]) CM/SW Contact  Eduard Roux, [place] Phone Number: 04/08/2019, 4:42 PM  Clinical Narrative:     Patient has no bed offers- CSW updated patient's son,Dominique.   Expected Discharge Plan: Skilled Nursing Facility Barriers to Discharge: Continued Medical Work up  Expected Discharge Plan and Services Expected Discharge Plan: Skilled Nursing Facility In-house Referral: Clinical Social Work     Living arrangements for the past 2 months: Single Family Home                                       Social Determinants of Health (SDOH) Interventions    Readmission Risk Interventions No flowsheet data found.
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
## 2019-04-08 NOTE — TOC Progression Note (Signed)
Transition of Care [facility]) - Progression Note    Patient Details  Name: Marc [name] MRN: [id number] Date of Birth: [birth year]  Transition of Care [facility]) CM/SW Contact  Eduard Roux, [place] Phone Number: 04/08/2019, 3:28 PM  Clinical Narrative:     CSW faxed clinical information to [facility].  Antony Blackbird, MSW, LCSWA Clinical Social Worker   Expected Discharge Plan: Skilled Nursing Facility Barriers to Discharge: Continued Medical Work up  Expected Discharge Plan and Services Expected Discharge Plan: Skilled Nursing Facility In-house Referral: Clinical Social Work     Living arrangements for the past 2 months: Single Family Home                                       Social Determinants of Health (SDOH) Interventions    Readmission Risk Interventions No flowsheet data found.

## 2019-04-09 DIAGNOSIS — T17908S Unspecified foreign body in respiratory tract, part unspecified causing other injury, sequela: Secondary | ICD-10-CM

## 2019-04-09 LAB — CBC WITH DIFFERENTIAL/PLATELET
Abs Immature Granulocytes: 0.01 10*3/uL (ref 0.00–0.07)
Basophils Absolute: 0 10*3/uL (ref 0.0–0.1)
Basophils Relative: 0 %
Eosinophils Absolute: 0.4 10*3/uL (ref 0.0–0.5)
Eosinophils Relative: 8 %
HCT: 40.4 % (ref 39.0–52.0)
Hemoglobin: 11.5 g/dL — ABNORMAL LOW (ref 13.0–17.0)
Immature Granulocytes: 0 %
Lymphocytes Relative: 35 %
Lymphs Abs: 1.6 10*3/uL (ref 0.7–4.0)
MCH: 24.9 pg — ABNORMAL LOW (ref 26.0–34.0)
MCHC: 28.5 g/dL — ABNORMAL LOW (ref 30.0–36.0)
MCV: 87.4 fL (ref 80.0–100.0)
Monocytes Absolute: 0.4 10*3/uL (ref 0.1–1.0)
Monocytes Relative: 10 %
Neutro Abs: 2.1 10*3/uL (ref 1.7–7.7)
Neutrophils Relative %: 47 %
Platelets: 155 10*3/uL (ref 150–400)
RBC: 4.62 MIL/uL (ref 4.22–5.81)
RDW: 17.2 % — ABNORMAL HIGH (ref 11.5–15.5)
WBC: 4.5 10*3/uL (ref 4.0–10.5)
nRBC: 0 % (ref 0.0–0.2)

## 2019-04-09 LAB — GLUCOSE, CAPILLARY
Glucose-Capillary: 114 mg/dL — ABNORMAL HIGH (ref 70–99)
Glucose-Capillary: 116 mg/dL — ABNORMAL HIGH (ref 70–99)
Glucose-Capillary: 90 mg/dL (ref 70–99)
Glucose-Capillary: 93 mg/dL (ref 70–99)
Glucose-Capillary: 97 mg/dL (ref 70–99)
Glucose-Capillary: 97 mg/dL (ref 70–99)

## 2019-04-09 LAB — COMPREHENSIVE METABOLIC PANEL
ALT: 26 U/L (ref 0–44)
AST: 22 U/L (ref 15–41)
Albumin: 2.7 g/dL — ABNORMAL LOW (ref 3.5–5.0)
Alkaline Phosphatase: 113 U/L (ref 38–126)
Anion gap: 7 (ref 5–15)
BUN: 24 mg/dL — ABNORMAL HIGH (ref 8–23)
CO2: 34 mmol/L — ABNORMAL HIGH (ref 22–32)
Calcium: 8.4 mg/dL — ABNORMAL LOW (ref 8.9–10.3)
Chloride: 95 mmol/L — ABNORMAL LOW (ref 98–111)
Creatinine, Ser: 0.63 mg/dL (ref 0.61–1.24)
GFR calc Af Amer: >60 mL/min (ref >60)
GFR calc non Af Amer: >60 mL/min (ref >60)
Glucose, Bld: 107 mg/dL — ABNORMAL HIGH (ref 70–99)
Potassium: 4.6 mmol/L (ref 3.5–5.1)
Sodium: 136 mmol/L (ref 135–145)
Total Bilirubin: 0.1 mg/dL — ABNORMAL LOW (ref 0.3–1.2)
Total Protein: 6.4 g/dL — ABNORMAL LOW (ref 6.5–8.1)

## 2019-04-09 LAB — MAGNESIUM: Magnesium: 2 mg/dL (ref 1.7–2.4)

## 2019-04-09 LAB — PHOSPHORUS: Phosphorus: 4.1 mg/dL (ref 2.5–4.6)

## 2019-04-09 MED ORDER — SODIUM CHLORIDE 0.9 % IV BOLUS
500.0000 mL | Freq: Once | INTRAVENOUS | Status: AC
  Administered 2019-04-09: 17:00:00 500 mL via INTRAVENOUS
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
## 2019-04-09 NOTE — TOC Progression Note (Signed)
Transition of Care [facility]) - Progression Note    Patient Details  Name: [name] MRN: [id number] Date of Birth: September 08, 1955  Transition of [facility]) CM/SW Contact  Eduard Roux, [place] Phone Number: 04/09/2019, 12:35 PM  Clinical Narrative:     CSW visit with the patient at bedside. CSW introduced self and explained role. CSW discussed ST rehab at [facility]. CW informed the patient CSW has been communicating and working with his son, [name] regarding SNF placement. Patient states he has apartment and lives home alone. CSW advised it was not a safe plan for him to discharge home alone.  CSW explained he was accepted at [facility] and explained the SNF process. Patient is in agreement with ST rehab at [facility]. Patient states no further questions or concerns at this time.   CSW updated patient's son, RN and SNF.  Patient's PSARR remains pending.  CSW will continue to follow and assist with discharge planning.   Antony Blackbird, MSW, LCSWA Clinical Social Worker   Expected Discharge Plan: Skilled Nursing Facility Barriers to Discharge: Continued Medical Work up  Expected Discharge Plan and Services Expected Discharge Plan: Skilled Nursing Facility In-house Referral: Clinical Social Work     Living arrangements for the past 2 months: Single Family Home                                       Social Determinants of Health (SDOH) Interventions    Readmission Risk Interventions No flowsheet data found.

## 2019-04-09 NOTE — Progress Notes (Signed)
Nutrition Follow-up  DOCUMENTATION CODES:   Not applicable  INTERVENTION:  Continue Jevity 1.5 formula via PEG at goal rate of 65 ml/hr.  Continue 30 ml Prostat TID per tube.   Continue 200 ml free water flushes every 4 hours. (MD to adjust as appropriate)  Tube feeding regimen to provide 2640 kcal, 145 grams of protein, and 2386 ml water.   NUTRITION DIAGNOSIS:   Increased nutrient needs related to acute illness as evidenced by estimated needs; ongoing   GOAL:   Patient will meet greater than or equal to 90% of their needs; met with TF  MONITOR:   TF tolerance, Skin, Weight trends, Labs, I & O's  REASON FOR ASSESSMENT:   Ventilator, Consult Enteral/tube feeding initiation and management  ASSESSMENT:   Patient with PMH significant for reported bipolar disease and DM. Presents this admission with acute encephalopathy from unclear etiology and severe septic shock.  1/23- trach placed 2/26 PEG placed    Pt with trach in place and capped. Per MD, if pt with no CO2 retention will consider decannulation. Pt continues on NPO status and has been tolerating his tube feedings well. RD to continue with current orders.   Labs and medications reviewed.   Diet Order:   Diet Order            Diet NPO time specified Except for: Other (See Comments)  Diet effective midnight              EDUCATION NEEDS:   No education needs have been identified at this time  Skin:  Skin Assessment: Reviewed RN Assessment Skin Integrity Issues:: Stage II, Other (Comment) Stage II: N/A Other: N/A  Last BM:  3/30  Height:   Ht Readings from Last 1 Encounters:  03/02/19 _0  (1.88 m)    Weight:   Wt Readings from Last 1 Encounters:  04/09/19 85.4 kg    Ideal Body Weight:  86.4 kg  BMI:  Body mass index is 24.17 kg/m.  Estimated Nutritional Needs:   Kcal:  4431-5400  Protein:  130-145 grams  Fluid:  > 2.2 L   Corrin Parker, MS, RD, LDN RD pager number/after  hours weekend pager number on Amion.

## 2019-04-10 ENCOUNTER — Inpatient Hospital Stay (HOSPITAL_COMMUNITY): Payer: Medicare Other

## 2019-04-10 LAB — BLOOD GAS, ARTERIAL
Acid-Base Excess: 10.2 mmol/L — ABNORMAL HIGH (ref 0.0–2.0)
Bicarbonate: 36.4 mmol/L — ABNORMAL HIGH (ref 20.0–28.0)
Drawn by: 441351
FIO2: 28
O2 Saturation: 95.5 %
Patient temperature: 36.8
pCO2 arterial: 72.8 mmHg (ref 32.0–48.0)
pH, Arterial: 7.318 — ABNORMAL LOW (ref 7.350–7.450)
pO2, Arterial: 81.7 mmHg — ABNORMAL LOW (ref 83.0–108.0)

## 2019-04-10 LAB — GLUCOSE, CAPILLARY
Glucose-Capillary: 98 mg/dL (ref 70–99)
Glucose-Capillary: 100 mg/dL — ABNORMAL HIGH (ref 70–99)
Glucose-Capillary: 89 mg/dL (ref 70–99)
Glucose-Capillary: 92 mg/dL (ref 70–99)
Glucose-Capillary: 112 mg/dL — ABNORMAL HIGH (ref 70–99)
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
## 2019-04-10 NOTE — Procedures (Addendum)
Tracheostomy Change Note  Patient Details:   Name: Marc [name] DOB: 1955-03-28 MRN: [id number]    Airway Documentation: 6 cuffles removed 6 cuffless replaced No downsizing at this time due to last ABG and secretions ++ end tidal co2     Evaluation  O2 sats: stable throughout Complications: No apparent complications Patient did tolerate procedure well. Bilateral Breath Sounds: Rhonchi  Changed and remains as a 6 cuffless due to secretions and ABG result. Remains on room air    Newt Lukes 04/10/2019, 12:14 PM

## 2019-04-10 NOTE — Progress Notes (Signed)
With concerns about confusion  Uncap tracheostomy tube
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
## 2019-04-11 ENCOUNTER — Inpatient Hospital Stay (HOSPITAL_COMMUNITY): Payer: Medicare Other

## 2019-04-11 LAB — CBC
HCT: 39 % (ref 39.0–52.0)
Hemoglobin: 11.4 g/dL — ABNORMAL LOW (ref 13.0–17.0)
MCH: 24.9 pg — ABNORMAL LOW (ref 26.0–34.0)
MCHC: 29.2 g/dL — ABNORMAL LOW (ref 30.0–36.0)
MCV: 85.3 fL (ref 80.0–100.0)
Platelets: 165 10*3/uL (ref 150–400)
RBC: 4.57 MIL/uL (ref 4.22–5.81)
RDW: 17.4 % — ABNORMAL HIGH (ref 11.5–15.5)
WBC: 4.7 10*3/uL (ref 4.0–10.5)
nRBC: 0 % (ref 0.0–0.2)

## 2019-04-11 LAB — BASIC METABOLIC PANEL WITH GFR
Anion gap: 9 (ref 5–15)
BUN: 27 mg/dL — ABNORMAL HIGH (ref 8–23)
CO2: 34 mmol/L — ABNORMAL HIGH (ref 22–32)
Calcium: 8.7 mg/dL — ABNORMAL LOW (ref 8.9–10.3)
Chloride: 96 mmol/L — ABNORMAL LOW (ref 98–111)
Creatinine, Ser: 0.64 mg/dL (ref 0.61–1.24)
GFR calc Af Amer: >60 mL/min (ref >60)
GFR calc non Af Amer: >60 mL/min (ref >60)
Glucose, Bld: 106 mg/dL — ABNORMAL HIGH (ref 70–99)
Potassium: 5 mmol/L (ref 3.5–5.1)
Sodium: 139 mmol/L (ref 135–145)

## 2019-04-11 LAB — GLUCOSE, CAPILLARY
Glucose-Capillary: 94 mg/dL (ref 70–99)
Glucose-Capillary: 143 mg/dL — ABNORMAL HIGH (ref 70–99)
Glucose-Capillary: 99 mg/dL (ref 70–99)
Glucose-Capillary: 87 mg/dL (ref 70–99)
Glucose-Capillary: 116 mg/dL — ABNORMAL HIGH (ref 70–99)
Glucose-Capillary: 88 mg/dL (ref 70–99)

## 2019-04-11 NOTE — TOC Progression Note (Signed)
Transition of Care [facility]) - Progression Note    Patient Details  Name: Marc [name] MRN: [id number] Date of Birth: 1955/09/23  Transition of Care [facility]) CM/SW Contact  Eduard Roux, [place] Phone Number: 04/11/2019, 11:07 AM  Clinical Narrative:     CSW called left voice message with [facility] interviewer Britta Mccreedy- per [facility], [place], patient has hx of schizoaffective disorder and delusional disorder. He was last [facility] by [facility] psychiatrist 2011 and [facility]) [facility]. Patient not receiving any mental health treatment through the [place].   PSARR remains pending.  Antony Blackbird, MSW, LCSWA Clinical Social Worker     Expected Discharge Plan: Skilled Nursing Facility Barriers to Discharge: Continued Medical Work up, Engineer, mining)  Expected Discharge Plan and Services Expected Discharge Plan: Skilled Nursing Facility In-house Referral: Clinical Social Work     Living arrangements for the past 2 months: Single Family Home                                       Social Determinants of Health (SDOH) Interventions    Readmission Risk Interventions No flowsheet data found.
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
## 2019-04-12 LAB — BLOOD GAS, ARTERIAL
Acid-Base Excess: 12 mmol/L — ABNORMAL HIGH (ref 0.0–2.0)
Bicarbonate: 38.2 mmol/L — ABNORMAL HIGH (ref 20.0–28.0)
Drawn by: 414221
FIO2: 28
O2 Saturation: 96.9 %
Patient temperature: 36.5
pCO2 arterial: 72.7 mmHg (ref 32.0–48.0)
pH, Arterial: 7.337 — ABNORMAL LOW (ref 7.350–7.450)
pO2, Arterial: 88.2 mmHg (ref 83.0–108.0)

## 2019-04-12 LAB — BASIC METABOLIC PANEL
Anion gap: 8 (ref 5–15)
BUN: 24 mg/dL — ABNORMAL HIGH (ref 8–23)
CO2: 36 mmol/L — ABNORMAL HIGH (ref 22–32)
Calcium: 8.7 mg/dL — ABNORMAL LOW (ref 8.9–10.3)
Chloride: 97 mmol/L — ABNORMAL LOW (ref 98–111)
Creatinine, Ser: 0.7 mg/dL (ref 0.61–1.24)
GFR calc Af Amer: >60 mL/min (ref >60)
GFR calc non Af Amer: >60 mL/min (ref >60)
Glucose, Bld: 98 mg/dL (ref 70–99)
Potassium: 4.6 mmol/L (ref 3.5–5.1)
Sodium: 141 mmol/L (ref 135–145)

## 2019-04-12 LAB — GLUCOSE, CAPILLARY
Glucose-Capillary: 100 mg/dL — ABNORMAL HIGH (ref 70–99)
Glucose-Capillary: 119 mg/dL — ABNORMAL HIGH (ref 70–99)
Glucose-Capillary: 92 mg/dL (ref 70–99)

## 2019-04-12 LAB — CBC
HCT: 40.1 % (ref 39.0–52.0)
Hemoglobin: 11.4 g/dL — ABNORMAL LOW (ref 13.0–17.0)
MCH: 24.7 pg — ABNORMAL LOW (ref 26.0–34.0)
MCHC: 28.4 g/dL — ABNORMAL LOW (ref 30.0–36.0)
MCV: 87 fL (ref 80.0–100.0)
Platelets: 185 10*3/uL (ref 150–400)
RBC: 4.61 MIL/uL (ref 4.22–5.81)
RDW: 17.6 % — ABNORMAL HIGH (ref 11.5–15.5)
WBC: 5 10*3/uL (ref 4.0–10.5)
nRBC: 0 % (ref 0.0–0.2)
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
## 2019-04-12 NOTE — Progress Notes (Signed)
Called by RN that pt had pulled his trach out.  Pt SATS 95% on room air and in no distress. Reinserted trach, with no problem noted. SATS remained 95% throughout.

## 2019-04-12 NOTE — Progress Notes (Signed)
CRITICAL VALUE ALERT  Critical Value:  PCO2 - 72.7  Date & Time Notied:  04/12/2019 0557  Provider Notified: Rito Ehrlich  Orders Received/Actions taken: No orders received.

## 2019-04-13 LAB — GLUCOSE, CAPILLARY
Glucose-Capillary: 101 mg/dL — ABNORMAL HIGH (ref 70–99)
Glucose-Capillary: 112 mg/dL — ABNORMAL HIGH (ref 70–99)
Glucose-Capillary: 115 mg/dL — ABNORMAL HIGH (ref 70–99)
Glucose-Capillary: 121 mg/dL — ABNORMAL HIGH (ref 70–99)
Glucose-Capillary: 123 mg/dL — ABNORMAL HIGH (ref 70–99)
Glucose-Capillary: 75 mg/dL (ref 70–99)
Glucose-Capillary: 75 mg/dL (ref 70–99)
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
## 2019-04-14 DIAGNOSIS — J432 Centrilobular emphysema: Secondary | ICD-10-CM

## 2019-04-14 DIAGNOSIS — J9612 Chronic respiratory failure with hypercapnia: Secondary | ICD-10-CM

## 2019-04-14 LAB — BASIC METABOLIC PANEL
Anion gap: 11 (ref 5–15)
BUN: 31 mg/dL — ABNORMAL HIGH (ref 8–23)
CO2: 32 mmol/L (ref 22–32)
Calcium: 8.7 mg/dL — ABNORMAL LOW (ref 8.9–10.3)
Chloride: 98 mmol/L (ref 98–111)
Creatinine, Ser: 0.57 mg/dL — ABNORMAL LOW (ref 0.61–1.24)
GFR calc Af Amer: >60 mL/min (ref >60)
GFR calc non Af Amer: >60 mL/min (ref >60)
Glucose, Bld: 106 mg/dL — ABNORMAL HIGH (ref 70–99)
Potassium: 4.6 mmol/L (ref 3.5–5.1)
Sodium: 141 mmol/L (ref 135–145)

## 2019-04-14 LAB — CBC
HCT: 41.5 % (ref 39.0–52.0)
Hemoglobin: 12.2 g/dL — ABNORMAL LOW (ref 13.0–17.0)
MCH: 25.4 pg — ABNORMAL LOW (ref 26.0–34.0)
MCHC: 29.4 g/dL — ABNORMAL LOW (ref 30.0–36.0)
MCV: 86.5 fL (ref 80.0–100.0)
Platelets: 202 10*3/uL (ref 150–400)
RBC: 4.8 MIL/uL (ref 4.22–5.81)
RDW: 17.8 % — ABNORMAL HIGH (ref 11.5–15.5)
WBC: 5.4 10*3/uL (ref 4.0–10.5)
nRBC: 0 % (ref 0.0–0.2)

## 2019-04-14 LAB — GLUCOSE, CAPILLARY
Glucose-Capillary: 108 mg/dL — ABNORMAL HIGH (ref 70–99)
Glucose-Capillary: 108 mg/dL — ABNORMAL HIGH (ref 70–99)
Glucose-Capillary: 94 mg/dL (ref 70–99)
Glucose-Capillary: 101 mg/dL — ABNORMAL HIGH (ref 70–99)
Glucose-Capillary: 91 mg/dL (ref 70–99)
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
## 2019-04-14 NOTE — Progress Notes (Signed)
Patient's trach was removed without any complications per MD order. Patient tolerated well and is on room air at this time.
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
## 2019-04-15 DIAGNOSIS — J9622 Acute and chronic respiratory failure with hypercapnia: Secondary | ICD-10-CM

## 2019-04-15 DIAGNOSIS — J9621 Acute and chronic respiratory failure with hypoxia: Secondary | ICD-10-CM

## 2019-04-15 LAB — CBC
HCT: 43.3 % (ref 39.0–52.0)
Hemoglobin: 12.8 g/dL — ABNORMAL LOW (ref 13.0–17.0)
MCH: 25.2 pg — ABNORMAL LOW (ref 26.0–34.0)
MCHC: 29.6 g/dL — ABNORMAL LOW (ref 30.0–36.0)
MCV: 85.2 fL (ref 80.0–100.0)
Platelets: 213 10*3/uL (ref 150–400)
RBC: 5.08 MIL/uL (ref 4.22–5.81)
RDW: 18.1 % — ABNORMAL HIGH (ref 11.5–15.5)
WBC: 6.1 10*3/uL (ref 4.0–10.5)
nRBC: 0 % (ref 0.0–0.2)

## 2019-04-15 LAB — GLUCOSE, CAPILLARY
Glucose-Capillary: 108 mg/dL — ABNORMAL HIGH (ref 70–99)
Glucose-Capillary: 108 mg/dL — ABNORMAL HIGH (ref 70–99)
Glucose-Capillary: 111 mg/dL — ABNORMAL HIGH (ref 70–99)
Glucose-Capillary: 114 mg/dL — ABNORMAL HIGH (ref 70–99)
Glucose-Capillary: 116 mg/dL — ABNORMAL HIGH (ref 70–99)
Glucose-Capillary: 120 mg/dL — ABNORMAL HIGH (ref 70–99)
Glucose-Capillary: 89 mg/dL (ref 70–99)

## 2019-04-15 LAB — COMPREHENSIVE METABOLIC PANEL
ALT: 17 U/L (ref 0–44)
AST: 17 U/L (ref 15–41)
Albumin: 3 g/dL — ABNORMAL LOW (ref 3.5–5.0)
Alkaline Phosphatase: 116 U/L (ref 38–126)
Anion gap: 9 (ref 5–15)
BUN: 33 mg/dL — ABNORMAL HIGH (ref 8–23)
CO2: 35 mmol/L — ABNORMAL HIGH (ref 22–32)
Calcium: 8.8 mg/dL — ABNORMAL LOW (ref 8.9–10.3)
Chloride: 97 mmol/L — ABNORMAL LOW (ref 98–111)
Creatinine, Ser: 0.73 mg/dL (ref 0.61–1.24)
GFR calc Af Amer: >60 mL/min (ref >60)
GFR calc non Af Amer: >60 mL/min (ref >60)
Glucose, Bld: 101 mg/dL — ABNORMAL HIGH (ref 70–99)
Potassium: 4.5 mmol/L (ref 3.5–5.1)
Sodium: 141 mmol/L (ref 135–145)
Total Bilirubin: 0.2 mg/dL — ABNORMAL LOW (ref 0.3–1.2)
Total Protein: 6.8 g/dL (ref 6.5–8.1)

## 2019-04-15 NOTE — Progress Notes (Signed)
Patient has not been able to participate in a trial without his posey belt restraint. Patient has tried to get out of bed multiple times today. With redirection patient was reoriented to place and time. It is not safe at this time for the restraints to come off. It may be beneficial for a sitter to be with the patient in order to safely remove the restraints.

## 2019-04-15 NOTE — Progress Notes (Signed)
SLP Cancellation Note  Patient Details Name: Marc [name] MRN: [id number] DOB: 1955-05-09   Cancelled treatment:       Reason Eval/Treat Not Completed: Other (comment) SLP has been swallowing for dysphagia goals. New order was received to reassess swallowing now that pt is decannulated. Pt's primary deficit across multiple prior MBS has been impaired sensation with silent aspiration. Therefore, instrumental testing would be indicated prior to making an diet changes. Per discussion with radiology, the earliest this can be completed is tomorrow. Will f/u for completion as able.    Mahala Menghini., M.A. CCC-SLP Acute Rehabilitation Services Pager [phone] Office [phone]  04/15/2019, 10:59 AM
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
## 2019-04-15 NOTE — TOC Progression Note (Signed)
Transition of Care [facility]) - Progression Note    Patient Details  Name: Marc [name] MRN: [id number] Date of Birth: 27-Jun-1955  [facility] CM/SW Contact  Eduard Roux, [place] Phone Number: 04/15/2019, 12:02 PM  Clinical Narrative:     Patient in restraints- patient must be out of restraints for 24 hours prior to d/c to SNF.  Antony Blackbird, MSW, LCSWA Clinical Social Worker    Expected Discharge Plan: Skilled Nursing Facility Barriers to Discharge: Continued Medical Work up, Engineer, mining)  Expected Discharge Plan and Services Expected Discharge Plan: Skilled Nursing Facility In-house Referral: Clinical Social Work     Living arrangements for the past 2 months: Single Family Home                                       Social Determinants of Health (SDOH) Interventions    Readmission Risk Interventions No flowsheet data found.
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
## 2019-04-16 ENCOUNTER — Inpatient Hospital Stay (HOSPITAL_COMMUNITY): Payer: Medicare Other

## 2019-04-16 LAB — GLUCOSE, CAPILLARY
Glucose-Capillary: 138 mg/dL — ABNORMAL HIGH (ref 70–99)
Glucose-Capillary: 145 mg/dL — ABNORMAL HIGH (ref 70–99)
Glucose-Capillary: 109 mg/dL — ABNORMAL HIGH (ref 70–99)
Glucose-Capillary: 135 mg/dL — ABNORMAL HIGH (ref 70–99)
Glucose-Capillary: 118 mg/dL — ABNORMAL HIGH (ref 70–99)
Glucose-Capillary: 115 mg/dL — ABNORMAL HIGH (ref 70–99)

## 2019-04-16 LAB — SARS CORONAVIRUS 2 (TAT 6-24 HRS): SARS Coronavirus 2: NEGATIVE

## 2019-04-16 NOTE — Progress Notes (Signed)
Modified Barium Swallow Progress Note  Patient Details  Name: Marc [name] MRN: [id number] Date of Birth: 12/12/1955  Today's Date: 04/16/2019  Modified Barium Swallow completed.  Full report located under Chart Review in the Imaging Section.  Brief recommendations include the following:  Clinical Impression  Pt shows mild improvements in swallowing even despite increased confusion during MBS. His oral phase remains similar in that he has prolonged bolus propulsion with poor cohesion, resulting in lingual residue and overall more passive transit of boluses. Pt has improved airway protection across challenging on this study even though most consistencies will reach his pyriform sinuses before the swallow. He promptly and silently aspirated thin liquids as they spilled directly into his airway. Otherwise, he had no aspiration. Pt still remains at high risk for intermittent, episodic aspiration events given the above, so would continue to recommend liquids by spoon only in order to better control rate and pacing. Will start with Dys 1 diet and nectar thick liquids by spoon with strict full supervision and use of aspiration precautions.    Swallow Evaluation Recommendations       SLP Diet Recommendations: Dysphagia 1 (Puree) solids;Nectar thick liquid   Liquid Administration via: Spoon   Medication Administration: Crushed with puree   Supervision: Full assist for feeding;Staff to assist with self feeding;Full supervision/cueing for compensatory strategies   Compensations: Minimize environmental distractions;Slow rate;Small sips/bites;Clear throat after each swallow;Chin tuck;Multiple dry swallows after each bite/sip   Postural Changes: Seated upright at 90 degrees;Remain semi-upright after after feeds/meals (Comment)   Oral Care Recommendations: Oral care QID   Other Recommendations: Order thickener from pharmacy;Prohibited food (jello, ice cream, thin soups);Remove water pitcher;Have  oral suction available     Mahala Menghini., M.A. CCC-SLP Acute Rehabilitation Services Pager [phone] Office [phone]  04/16/2019,4:07 PM
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
## 2019-04-16 NOTE — Progress Notes (Signed)
Nutrition Follow-up  DOCUMENTATION CODES:   Not applicable  INTERVENTION:  Continue Jevity 1.5 formula via PEG at goal rate of 65 ml/hr.  Continue 30 ml Prostat TID per tube.   Continue 200 ml free water flushes every 4 hours. (MD to adjust as appropriate)  Tube feeding regimen to provide 2640 kcal, 145 grams of protein, and 2386 ml water.  NUTRITION DIAGNOSIS:   Increased nutrient needs related to acute illness as evidenced by estimated needs; ongoing  GOAL:   Patient will meet greater than or equal to 90% of their needs; met with TF  MONITOR:   TF tolerance, Skin, Weight trends, Labs, I & O's  REASON FOR ASSESSMENT:   Ventilator, Consult Enteral/tube feeding initiation and management  ASSESSMENT:   Patient with PMH significant for reported bipolar disease and DM. Presents this admission with acute encephalopathy from unclear etiology and severe septic shock.  1/23- trach placed 2/26- PEG placed 4/5- decannulated   Diet has been advanced to a dysphagia 1 diet with nectar thick liquids. Pt waxing and waning with hallucinations and delirium on restraints. RD to continue with current tube feeding orders and modify enteral nutrition pending improvement in po intake. Labs and medications reviewed.   Diet Order:   Diet Order            DIET - DYS 1 Room service appropriate? Yes; Fluid consistency: Nectar Thick  Diet effective now              EDUCATION NEEDS:   No education needs have been identified at this time  Skin:  Skin Assessment: Reviewed RN Assessment Skin Integrity Issues:: Stage II, Other (Comment) Stage II: N/A Other: N/A  Last BM:  4/6  Height:   Ht Readings from Last 1 Encounters:  03/02/19 6' 2" (1.88 m)    Weight:   Wt Readings from Last 1 Encounters:  04/16/19 84.3 kg    BMI:  Body mass index is 23.86 kg/m.  Estimated Nutritional Needs:   Kcal:  1735-6701  Protein:  130-145 grams  Fluid:  > 2.2 L   Corrin Parker, MS, RD, LDN RD pager number/after hours weekend pager number on Amion.

## 2019-04-17 LAB — GLUCOSE, CAPILLARY
Glucose-Capillary: 121 mg/dL — ABNORMAL HIGH (ref 70–99)
Glucose-Capillary: 115 mg/dL — ABNORMAL HIGH (ref 70–99)
Glucose-Capillary: 77 mg/dL (ref 70–99)
Glucose-Capillary: 97 mg/dL (ref 70–99)

## 2019-04-17 MED ORDER — MELATONIN 3 MG PO TABS: 9.0000 mg | ORAL_TABLET | Freq: Every day | ORAL | 0 refills | Status: AC

## 2019-04-17 MED ORDER — TRAZODONE HCL 50 MG PO TABS: 50.0000 mg | ORAL_TABLET | Freq: Every day | ORAL | 0 refills | Status: AC

## 2019-04-17 MED ORDER — GLYCOPYRROLATE 1 MG PO TABS: 1.0000 mg | ORAL_TABLET | Freq: Three times a day (TID) | ORAL | 0 refills | Status: DC

## 2019-04-17 MED ORDER — QUETIAPINE FUMARATE 25 MG PO TABS: 25.0000 mg | ORAL_TABLET | Freq: Every day | ORAL | 0 refills | Status: AC

## 2019-04-17 MED ORDER — HALOPERIDOL 1 MG PO TABS: 1.0000 mg | ORAL_TABLET | Freq: Two times a day (BID) | ORAL | 0 refills | Status: DC

## 2019-04-17 NOTE — Discharge Summary (Addendum)
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 The SARS-CoV-2 RNA is generally detectable in upper and lower respiratory specimens during the acute phase of infection. Negative results do not preclude SARS-CoV-2 infection, do not rule out co-infections with other pathogens, and should not be used as the sole basis for treatment or other patient management decisions. Negative results must be combined with clinical observations, patient history, and epidemiological information. The expected result is Negative. Fact Sheet for Patients: HairSlick.no Fact Sheet for Healthcare Providers: quierodirigir.com This test is not yet approved or cleared by the [place] FDA and  has been authorized for detection and/or diagnosis of SARS-CoV-2 by FDA under an Emergency Use Authorization (EUA). This EUA will remain  in effect (meaning this test can be used) for the duration of the COVID-19 declaration under Section 56 4(b)(1) of  the Act, 21 U.S.C. section 360bbb-3(b)(1), unless the authorization is terminated or revoked sooner. Performed at [facility], [place] [address], [place], [place] [address]      Labs: BNP (last 3 results) No results for input(s): BNP in the last  8760 hours. Basic Metabolic Panel: Recent Labs  Lab 04/11/19 0707 04/12/19 0635 04/14/19 0541 04/15/19 0444  NA 139 141 141 141  K 5.0 4.6 4.6 4.5  CL 96* 97* 98 97*  CO2 34* 36* 32 35*  GLUCOSE 106* 98 106* 101*  BUN 27* 24* 31* 33*  CREATININE 0.64 0.70 0.57* 0.73  CALCIUM 8.7* 8.7* 8.7* 8.8*   Liver Function Tests: Recent Labs  Lab 04/15/19 0444  AST 17  ALT 17  ALKPHOS 116  BILITOT 0.2*  PROT 6.8  ALBUMIN 3.0*   No results for input(s): LIPASE, AMYLASE in the last 168 hours. No results for input(s): AMMONIA in the last 168 hours. CBC: Recent Labs  Lab 04/11/19 0707 04/12/19 0635 04/14/19 0541 04/15/19 0444  WBC 4.7 5.0 5.4 6.1  HGB 11.4* 11.4* 12.2* 12.8*  HCT 39.0 40.1 41.5 43.3  MCV 85.3 87.0 86.5 85.2  PLT 165 185 202 213   Cardiac Enzymes: No results for input(s): CKTOTAL, CKMB, CKMBINDEX, TROPONINI in the last 168 hours. BNP: Invalid input(s): POCBNP CBG: Recent Labs  Lab 04/16/19 1933 04/16/19 2328 04/17/19 0328 04/17/19 0727 04/17/19 1254  GLUCAP 118* 115* 121* 115* 77   D-Dimer No results for input(s): DDIMER in the last 72 hours. Hgb A1c No results for input(s): HGBA1C in the last 72 hours. Lipid Profile No results for input(s): CHOL, HDL, LDLCALC, TRIG, CHOLHDL, LDLDIRECT in the last 72 hours. Thyroid function studies No results for input(s): TSH, T4TOTAL, T3FREE, THYROIDAB in the last 72 hours.  Invalid input(s): FREET3 Anemia work up No results for input(s): VITAMINB12, FOLATE, FERRITIN, TIBC, IRON, RETICCTPCT in the last 72 hours. Urinalysis    Component Value Date/Time   COLORURINE YELLOW 04/02/2019 1359   APPEARANCEUR CLOUDY (A) 04/02/2019 1359   LABSPEC 1.018 04/02/2019 1359   PHURINE 8.0 04/02/2019 1359   GLUCOSEU NEGATIVE 04/02/2019 1359   HGBUR NEGATIVE 04/02/2019 1359   BILIRUBINUR NEGATIVE 04/02/2019 1359   KETONESUR NEGATIVE 04/02/2019 1359   PROTEINUR NEGATIVE 04/02/2019 1359   NITRITE NEGATIVE 04/02/2019 1359    LEUKOCYTESUR LARGE (A) 04/02/2019 1359   Sepsis Labs Invalid input(s): PROCALCITONIN,  WBC,  LACTICIDVEN Microbiology Recent Results (from the past 240 hour(s))  SARS CORONAVIRUS 2 (TAT 6-24 HRS) Nasopharyngeal Nasopharyngeal Swab     Status: None   Collection Time: 04/16/19  3:10 PM   Specimen: Nasopharyngeal Swab  Result Value Ref Range Status   SARS Coronavirus 2 NEGATIVE NEGATIVE Final    Comment: (NOTE) SARS-CoV-2 target nucleic acids are NOT DETECTED. The SARS-CoV-2 RNA is generally detectable in upper and lower respiratory specimens during the acute phase of infection. Negative results do not preclude SARS-CoV-2 infection, do not rule out co-infections with other pathogens, and should not be used as the sole basis for treatment or other patient management decisions. Negative results must be combined with clinical observations, patient history, and epidemiological information. The expected result is Negative. Fact Sheet for Patients: SugarRoll.be Fact Sheet for Healthcare Providers: https://www.woods-mathews.com/ This test is not yet approved or cleared by the [place] FDA and  has been authorized for detection and/or diagnosis of SARS-CoV-2 by FDA under an Emergency Use Authorization (EUA). This EUA will remain  in effect (meaning this test can  be used) for the duration of the COVID-19 declaration under Section 56 4(b)(1) of the Act, 21 U.S.C. section 360bbb-3(b)(1), unless the authorization is terminated or revoked sooner. Performed at [facility], [address], [place], [place] [address]      Patient was seen and examined on the day of discharge and was found to be in stable condition. Time coordinating discharge: 45 minutes including assessment and coordination of care, as well as examination of the patient.   SIGNED:  Noralee Stain, DO [facility] 04/17/2019, 1:08 PM
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
## 2019-04-17 NOTE — TOC Transition Note (Signed)
Transition of Care [facility]) - CM/SW Discharge Note   Patient Details  Name: Marc [name] MRN: [id number] Date of Birth: [birth year]  Transition of Care [facility]) CM/SW Contact:  Eduard Roux, LCSWA Phone Number: 04/17/2019, 2:01 PM   Clinical Narrative:     Patient will DC to: [facility] DC Date: 04/17/2019 Family Notified: Dominique,son Transport ZU:DODQ  RN, patient, and facility notified of DC. Discharge Summary sent to facility. RN given number for [phone], [address]. Ambulance transport requested for patient.   Clinical Social Worker signing off.  Antony Blackbird, MSW, LCSWA Clinical Social Worker    Final next level of care: Skilled Nursing Facility Barriers to Discharge: Barriers Resolved   Patient Goals and CMS Choice        Discharge Placement              Patient [facility] bed at: [facility] Patient to be transferred to facility by: [name] Name of family member notified: Dominique,son Patient and family notified of of transfer: 04/17/19  Discharge Plan and Services In-house Referral: Clinical Social Work                                   Social Determinants of Health (SDOH) Interventions     Readmission Risk Interventions No flowsheet data found.
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
## 2019-04-17 NOTE — TOC Progression Note (Signed)
Transition of Care [facility]) - Progression Note    Patient Details  Name: Marc [name] MRN: [id number] Date of Birth: 11/05/1955  Transition of Care [facility]) CM/SW Contact  Eduard Roux, [place] Phone Number: 04/17/2019, 12:50 PM  Clinical Narrative:     CSW called SNF to inform of anticipated discharge- lVW, waiting on response.  Antony Blackbird, MSW, LCSWA Clinical Social Worker   Expected Discharge Plan: Skilled Nursing Facility Barriers to Discharge: Continued Medical Work up, Engineer, mining)  Expected Discharge Plan and Services Expected Discharge Plan: Skilled Nursing Facility In-house Referral: Clinical Social Work     Living arrangements for the past 2 months: Single Family Home                                       Social Determinants of Health (SDOH) Interventions    Readmission Risk Interventions No flowsheet data found.

## 2019-04-17 NOTE — Progress Notes (Signed)
Patient to be discharged to [facility].  Report called to [facility].  IV removed.  Patient to be transported via [name].

## 2019-04-18 ENCOUNTER — Encounter (HOSPITAL_COMMUNITY): Payer: Self-pay

## 2019-04-22 ENCOUNTER — Inpatient Hospital Stay (HOSPITAL_COMMUNITY)
Admission: EM | Admit: 2019-04-22 | Discharge: 2019-05-02 | DRG: 871 | Disposition: A | Discharge status: Skilled Nursing Facility | Payer: Medicare Other | Attending: Internal Medicine | Admitting: Internal Medicine

## 2019-04-22 ENCOUNTER — Encounter (HOSPITAL_COMMUNITY): Payer: Self-pay | Admitting: Emergency Medicine

## 2019-04-22 ENCOUNTER — Emergency Department (HOSPITAL_COMMUNITY): Payer: Medicare Other

## 2019-04-22 ENCOUNTER — Other Ambulatory Visit: Payer: Self-pay

## 2019-04-22 DIAGNOSIS — R1310 Dysphagia, unspecified: Secondary | ICD-10-CM | POA: Diagnosis present

## 2019-04-22 DIAGNOSIS — E870 Hyperosmolality and hypernatremia: Secondary | ICD-10-CM | POA: Diagnosis present

## 2019-04-22 DIAGNOSIS — J690 Pneumonitis due to inhalation of food and vomit: Secondary | ICD-10-CM | POA: Diagnosis present

## 2019-04-22 DIAGNOSIS — R21 Rash and other nonspecific skin eruption: Secondary | ICD-10-CM | POA: Diagnosis present

## 2019-04-22 DIAGNOSIS — R4182 Altered mental status, unspecified: Secondary | ICD-10-CM | POA: Diagnosis present

## 2019-04-22 DIAGNOSIS — D509 Iron deficiency anemia, unspecified: Secondary | ICD-10-CM | POA: Diagnosis present

## 2019-04-22 DIAGNOSIS — R6521 Severe sepsis with septic shock: Secondary | ICD-10-CM | POA: Diagnosis present

## 2019-04-22 DIAGNOSIS — J9601 Acute respiratory failure with hypoxia: Secondary | ICD-10-CM | POA: Diagnosis present

## 2019-04-22 DIAGNOSIS — I071 Rheumatic tricuspid insufficiency: Secondary | ICD-10-CM | POA: Diagnosis present

## 2019-04-22 DIAGNOSIS — F17210 Nicotine dependence, cigarettes, uncomplicated: Secondary | ICD-10-CM | POA: Diagnosis present

## 2019-04-22 DIAGNOSIS — L89151 Pressure ulcer of sacral region, stage 1: Secondary | ICD-10-CM | POA: Diagnosis present

## 2019-04-22 DIAGNOSIS — Z931 Gastrostomy status: Secondary | ICD-10-CM

## 2019-04-22 DIAGNOSIS — F319 Bipolar disorder, unspecified: Secondary | ICD-10-CM | POA: Diagnosis present

## 2019-04-22 DIAGNOSIS — I959 Hypotension, unspecified: Secondary | ICD-10-CM | POA: Diagnosis present

## 2019-04-22 DIAGNOSIS — Z4659 Encounter for fitting and adjustment of other gastrointestinal appliance and device: Secondary | ICD-10-CM

## 2019-04-22 DIAGNOSIS — R680 Hypothermia, not associated with low environmental temperature: Secondary | ICD-10-CM | POA: Diagnosis present

## 2019-04-22 DIAGNOSIS — N390 Urinary tract infection, site not specified: Secondary | ICD-10-CM | POA: Diagnosis present

## 2019-04-22 DIAGNOSIS — Z20822 Contact with and (suspected) exposure to covid-19: Secondary | ICD-10-CM | POA: Diagnosis present

## 2019-04-22 DIAGNOSIS — E11649 Type 2 diabetes mellitus with hypoglycemia without coma: Secondary | ICD-10-CM | POA: Diagnosis present

## 2019-04-22 DIAGNOSIS — Z79899 Other long term (current) drug therapy: Secondary | ICD-10-CM

## 2019-04-22 DIAGNOSIS — D638 Anemia in other chronic diseases classified elsewhere: Secondary | ICD-10-CM | POA: Diagnosis present

## 2019-04-22 DIAGNOSIS — Z883 Allergy status to other anti-infective agents status: Secondary | ICD-10-CM

## 2019-04-22 DIAGNOSIS — G9341 Metabolic encephalopathy: Secondary | ICD-10-CM | POA: Diagnosis present

## 2019-04-22 DIAGNOSIS — F209 Schizophrenia, unspecified: Secondary | ICD-10-CM | POA: Diagnosis present

## 2019-04-22 DIAGNOSIS — A419 Sepsis, unspecified organism: Secondary | ICD-10-CM | POA: Diagnosis present

## 2019-04-22 DIAGNOSIS — I10 Essential (primary) hypertension: Secondary | ICD-10-CM | POA: Diagnosis present

## 2019-04-22 DIAGNOSIS — Z452 Encounter for adjustment and management of vascular access device: Secondary | ICD-10-CM

## 2019-04-22 DIAGNOSIS — Z781 Physical restraint status: Secondary | ICD-10-CM | POA: Diagnosis not present

## 2019-04-22 DIAGNOSIS — J9602 Acute respiratory failure with hypercapnia: Secondary | ICD-10-CM | POA: Diagnosis present

## 2019-04-22 DIAGNOSIS — F05 Delirium due to known physiological condition: Secondary | ICD-10-CM | POA: Diagnosis not present

## 2019-04-22 DIAGNOSIS — B9689 Other specified bacterial agents as the cause of diseases classified elsewhere: Secondary | ICD-10-CM | POA: Diagnosis present

## 2019-04-22 DIAGNOSIS — Z8619 Personal history of other infectious and parasitic diseases: Secondary | ICD-10-CM

## 2019-04-22 DIAGNOSIS — T68XXXA Hypothermia, initial encounter: Secondary | ICD-10-CM

## 2019-04-22 DIAGNOSIS — J9600 Acute respiratory failure, unspecified whether with hypoxia or hypercapnia: Secondary | ICD-10-CM | POA: Diagnosis present

## 2019-04-22 DIAGNOSIS — Z789 Other specified health status: Secondary | ICD-10-CM

## 2019-04-22 LAB — COMPREHENSIVE METABOLIC PANEL
ALT: 23 U/L (ref 0–44)
AST: 20 U/L (ref 15–41)
Albumin: 2.2 g/dL — ABNORMAL LOW (ref 3.5–5.0)
Alkaline Phosphatase: 84 U/L (ref 38–126)
Anion gap: 6 (ref 5–15)
BUN: 34 mg/dL — ABNORMAL HIGH (ref 8–23)
CO2: 31 mmol/L (ref 22–32)
Calcium: 7.2 mg/dL — ABNORMAL LOW (ref 8.9–10.3)
Chloride: 106 mmol/L (ref 98–111)
Creatinine, Ser: 0.58 mg/dL — ABNORMAL LOW (ref 0.61–1.24)
GFR calc Af Amer: >60 mL/min (ref >60)
GFR calc non Af Amer: >60 mL/min (ref >60)
Glucose, Bld: 101 mg/dL — ABNORMAL HIGH (ref 70–99)
Potassium: 3.5 mmol/L (ref 3.5–5.1)
Sodium: 143 mmol/L (ref 135–145)
Total Bilirubin: 0.4 mg/dL (ref 0.3–1.2)
Total Protein: 4.6 g/dL — ABNORMAL LOW (ref 6.5–8.1)

## 2019-04-22 LAB — CBC WITH DIFFERENTIAL/PLATELET
Abs Immature Granulocytes: 0 10*3/uL (ref 0.00–0.07)
Basophils Absolute: 0 10*3/uL (ref 0.0–0.1)
Basophils Relative: 1 %
Eosinophils Absolute: 0.4 10*3/uL (ref 0.0–0.5)
Eosinophils Relative: 10 %
HCT: 35.4 % — ABNORMAL LOW (ref 39.0–52.0)
Hemoglobin: 10.1 g/dL — ABNORMAL LOW (ref 13.0–17.0)
Immature Granulocytes: 0 %
Lymphocytes Relative: 28 %
Lymphs Abs: 1.1 10*3/uL (ref 0.7–4.0)
MCH: 25.4 pg — ABNORMAL LOW (ref 26.0–34.0)
MCHC: 28.5 g/dL — ABNORMAL LOW (ref 30.0–36.0)
MCV: 88.9 fL (ref 80.0–100.0)
Monocytes Absolute: 0.3 10*3/uL (ref 0.1–1.0)
Monocytes Relative: 8 %
Neutro Abs: 2 10*3/uL (ref 1.7–7.7)
Neutrophils Relative %: 53 %
Platelets: 150 10*3/uL (ref 150–400)
RBC: 3.98 MIL/uL — ABNORMAL LOW (ref 4.22–5.81)
RDW: 18.5 % — ABNORMAL HIGH (ref 11.5–15.5)
WBC: 3.8 10*3/uL — ABNORMAL LOW (ref 4.0–10.5)
nRBC: 0 % (ref 0.0–0.2)

## 2019-04-22 LAB — URINALYSIS, ROUTINE W REFLEX MICROSCOPIC
Bilirubin Urine: NEGATIVE
Glucose, UA: NEGATIVE mg/dL
Ketones, ur: NEGATIVE mg/dL
Nitrite: NEGATIVE
Protein, ur: 100 mg/dL — AB
Specific Gravity, Urine: 1.024 (ref 1.005–1.030)
WBC, UA: >50 WBC/hpf — ABNORMAL HIGH (ref 0–5)
pH: 5 (ref 5.0–8.0)

## 2019-04-22 LAB — TYPE AND SCREEN
ABO/RH(D): O POS
Antibody Screen: NEGATIVE

## 2019-04-22 LAB — PROTIME-INR
INR: 1.1 (ref 0.8–1.2)
Prothrombin Time: 14.1 seconds (ref 11.4–15.2)

## 2019-04-22 LAB — CBG MONITORING, ED: Glucose-Capillary: 87 mg/dL (ref 70–99)

## 2019-04-22 LAB — RESPIRATORY PANEL BY RT PCR (FLU A&B, COVID)
Influenza A by PCR: NEGATIVE
Influenza B by PCR: NEGATIVE
SARS Coronavirus 2 by RT PCR: NEGATIVE

## 2019-04-22 LAB — LACTIC ACID, PLASMA
Lactic Acid, Venous: 0.9 mmol/L (ref 0.5–1.9)
Lactic Acid, Venous: 1 mmol/L (ref 0.5–1.9)

## 2019-04-22 LAB — APTT: aPTT: 40 seconds — ABNORMAL HIGH (ref 24–36)

## 2019-04-22 LAB — HEMOGLOBIN AND HEMATOCRIT, BLOOD
HCT: 35.6 % — ABNORMAL LOW (ref 39.0–52.0)
Hemoglobin: 10.3 g/dL — ABNORMAL LOW (ref 13.0–17.0)

## 2019-04-22 MED ORDER — VANCOMYCIN HCL IN DEXTROSE 1-5 GM/200ML-% IV SOLN: 1000.0000 mg | Freq: Once | INTRAVENOUS | Status: DC

## 2019-04-22 MED ORDER — SODIUM CHLORIDE 0.9 % IV BOLUS (SEPSIS)
1000.0000 mL | Freq: Once | INTRAVENOUS | Status: AC
  Administered 2019-04-22: 1000 mL via INTRAVENOUS

## 2019-04-22 MED ORDER — VANCOMYCIN HCL 1250 MG/250ML IV SOLN
1250.0000 mg | Freq: Two times a day (BID) | INTRAVENOUS | Status: DC
  Administered 2019-04-23 – 2019-04-24 (×3): 1250 mg via INTRAVENOUS
  Filled 2019-04-22 (×3): qty 250

## 2019-04-22 MED ORDER — SODIUM CHLORIDE 0.9 % IV SOLN
2.0000 g | Freq: Once | INTRAVENOUS | Status: AC
  Administered 2019-04-22: 2 g via INTRAVENOUS
  Filled 2019-04-22: qty 2

## 2019-04-22 MED ORDER — SODIUM CHLORIDE 0.9 % IV BOLUS (SEPSIS): 1000.0000 mL | Freq: Once | INTRAVENOUS | Status: DC

## 2019-04-22 MED ORDER — SODIUM CHLORIDE 0.9 % IV BOLUS
1000.0000 mL | Freq: Once | INTRAVENOUS | Status: AC
  Administered 2019-04-22: 1000 mL via INTRAVENOUS

## 2019-04-22 MED ORDER — METRONIDAZOLE IN NACL 5-0.79 MG/ML-% IV SOLN
500.0000 mg | Freq: Once | INTRAVENOUS | Status: AC
  Administered 2019-04-22: 500 mg via INTRAVENOUS
  Filled 2019-04-22: qty 100

## 2019-04-22 MED ORDER — VANCOMYCIN HCL 2000 MG/400ML IV SOLN
2000.0000 mg | Freq: Once | INTRAVENOUS | Status: AC
  Administered 2019-04-22: 2000 mg via INTRAVENOUS
  Filled 2019-04-22: qty 400

## 2019-04-22 MED ORDER — SODIUM CHLORIDE 0.9 % IV SOLN
2.0000 g | Freq: Three times a day (TID) | INTRAVENOUS | Status: AC
  Administered 2019-04-23 – 2019-04-29 (×21): 2 g via INTRAVENOUS
  Filled 2019-04-22 (×22): qty 2

## 2019-04-22 MED ORDER — SODIUM CHLORIDE 0.9 % IV SOLN
250.0000 mL | INTRAVENOUS | Status: DC
  Administered 2019-04-25 – 2019-04-26 (×2): 250 mL via INTRAVENOUS

## 2019-04-22 MED ORDER — NOREPINEPHRINE 4 MG/250ML-% IV SOLN
2.0000 ug/min | INTRAVENOUS | Status: DC
  Administered 2019-04-22: 2 ug/min via INTRAVENOUS
  Administered 2019-04-23: 4 ug/min via INTRAVENOUS
  Administered 2019-04-24: 5 ug/min via INTRAVENOUS
  Filled 2019-04-22 (×3): qty 250

## 2019-04-22 NOTE — ED Triage Notes (Signed)
From [facility]. Pt's temp was 94 rectal. cbg 100.  Staff at [employer] pt is more lethargic than normal.

## 2019-04-22 NOTE — Progress Notes (Signed)
Pharmacy Antibiotic Note  Marc [name] is a 64 y.o. male admitted on 04/22/2019 with unknown source of infection.  Pharmacy has been consulted for Cefepime and Vancomycin dosing.  Plan: Vancomycin 2000 mg IV x 1 dose. Vancomycin 1250 mg IV every 12 hours. Expected AUC 531 Cefepime 2000 mg IV every 8 hours. Monitor labs, c/s, and vanco level as indicated.  Height: 6\' 2"  (188 cm) Weight: 84 kg (185 lb 3 oz) IBW/kg (Calculated) : 82.2  Temp (24hrs), Avg:89.5 F (31.9 C), Min:89.5 F (31.9 C), Max:89.5 F (31.9 C)  No results for input(s): WBC, CREATININE, LATICACIDVEN, VANCOTROUGH, VANCOPEAK, VANCORANDOM, GENTTROUGH, GENTPEAK, GENTRANDOM, TOBRATROUGH, TOBRAPEAK, TOBRARND, AMIKACINPEAK, AMIKACINTROU, AMIKACIN in the last 168 hours.  Estimated Creatinine Clearance: 108.5 mL/min (by C-G formula based on SCr of 0.73 mg/dL).    Allergies  Allergen Reactions  . Chlorhexidine     Antimicrobials this admission: Vanco 4/13 >>  Cefepime 4/13 >>   Dose adjustments this admission: N/A  Microbiology results: 4/13 BCx: pending 4/13 UCx: pending    Thank you for allowing pharmacy to be a part of this patient's care.  5/13 04/22/2019 4:11 PM

## 2019-04-22 NOTE — Progress Notes (Signed)
Bedside RN asked about initial LA. RN informed me that pt was very hard stick and that lab had just left the pt's room and that it should be showing up soon.

## 2019-04-22 NOTE — ED Notes (Signed)
OK to use CVC by edp

## 2019-04-22 NOTE — H&P (Signed)
NAME:  Marc [name], MRN:  [id number], DOB:  [birth year], LOS: 0 ADMISSION DATE:  04/22/2019, CONSULTATION DATE:  4/13 REFERRING MD:  Dr. Roderic Palau EDP Forestine Na, CHIEF COMPLAINT:  Septic shock   Brief History   64 year old male with recent prolonged admission with MSSA endocarditis during which he required long term intubation and trach. Discharged 4/8. Now admitted 4/13 for presumed septic shock.   History of present illness   64 year old male with PMH as below, which is significant for bipolar disorder, diabetes, hypertension, and schizophrenia.  He also had a recent prolonged admission from January 12 to February 8 of this year during which she was found to have mitral valve MSSA endocarditis resulting in septic shock.  He was initially admitted to the ICU and treated with IV cefazolin.  Antibiotics ended on 2/26.  Unfortunately his course was complicated by respiratory failure requiring long-term intubation and ultimately tracheostomy.  He was able to be decannulated on 4/5.  Course also complicated by altered mental status felt at least in some part to be due to hospital induced delirium and this remained at the time of discharge.  He was also treated for Providencia rettgeri UTI with ceftriaxone course completed 3/30.  He was discharged to [facility] on 4/8.  On 4/13 [facility] staff noted increased lethargy and on vitals check he was noted to have rectal temperature of 94.  He was transferred to [facility] emergency department for further evaluation.  In the ED he was noted to have altered mental status, hypothermia, and mild bradycardia.  He did eventually become hypotensive requiring low-dose norepinephrine infusion.  Central line placed in the ED.  Laboratory evaluation significant for WBC 3.8, hemoglobin 10.1, and urinalysis with many bacteria and small leukocytes.  He was started on empiric antibiotics.  He was transferred to [facility] under [facility]M for ICU admission.   Past  Medical History   has a past medical history of Bipolar 1 disorder ([place]), Bipolar affective ([place]), Diabetes mellitus without complication ([place]), Hypertension, and Schizophrenic disorder ([place]).   Significant Hospital Events   1/12 >4/8 admission for MSSA endocarditis.   Consults:    Procedures:  CVL 4/13 >  Significant Diagnostic Tests:    Micro Data:  BC 4/13 > Urine 4/13 >  Antimicrobials:  Cefepime 4/13 > Vancomycin 4/13 >  Interim history/subjective:  Dizzy. No complaints otherwise.   Objective   Blood pressure 112/82, pulse (!) 53, temperature (!) 89.4 F (31.9 C), resp. rate 18, height 6\' 2"  (1.88 m), weight 84 kg, SpO2 96 %.       No intake or output data in the 24 hours ending 04/22/19 2126 Filed Weights   04/22/19 1526  Weight: 84 kg    Examination: General: Frail adult male who appears older than stated age HENT: /AT, recent trach site with sloughing Lungs: Clear Cardiovascular: RRR, no obvious murmur Abdomen: Soft, NT, ND, PEG Extremities: No acute deformity Neuro: Awake, alert, oriented to self, time.  GU: Foley  Resolved Hospital Problem list     Assessment & Plan:   Septic shock: presumably secondary to urosepsis. Lactic acid only 1 in ED.  - Admit to ICU - Ensure adequate volume resuscitation - Continue levophed for MAP goal 65 - Empiric cefepime, vancomycin - Cultures pending  Acute metabolic encephalopathy: Unclear how much of this was present at discharge, and how much is new.  - Supportive care - Treat shock/sepsis  Recent history of MSSA  endocarditis: completed ABX in March.  - Check echo - BC pending   DM - CBG monitoring and SSI  Bipolar disorder Schizophrenia  - Holding outpatient seroquel, trazodone, and haldol for now.    Best practice:  Diet: NPO Pain/Anxiety/Delirium protocol (if indicated): NA VAP protocol (if indicated): NA DVT prophylaxis: heparin GI prophylaxis: NA Glucose control: SSI Mobility:  BR Code Status: FULL Family Communication: Son [name] updated via phone.  Disposition: ICU  Labs   CBC: Recent Labs  Lab 04/22/19 1723  WBC 3.8*  NEUTROABS 2.0  HGB 10.1*  HCT 35.4*  MCV 88.9  PLT 150    Basic Metabolic Panel: Recent Labs  Lab 04/22/19 1723  NA 143  K 3.5  CL 106  CO2 31  GLUCOSE 101*  BUN 34*  CREATININE 0.58*  CALCIUM 7.2*   GFR: Estimated Creatinine Clearance: 108.5 mL/min (A) (by C-G formula based on SCr of 0.58 mg/dL (L)). Recent Labs  Lab 04/22/19 1723 04/22/19 1901  WBC 3.8*  --   LATICACIDVEN 0.9 1.0    Liver Function Tests: Recent Labs  Lab 04/22/19 1723  AST 20  ALT 23  ALKPHOS 84  BILITOT 0.4  PROT 4.6*  ALBUMIN 2.2*   No results for input(s): LIPASE, AMYLASE in the last 168 hours. No results for input(s): AMMONIA in the last 168 hours.  ABG    Component Value Date/Time   PHART 7.337 (L) 04/12/2019 0530   PCO2ART 72.7 (HH) 04/12/2019 0530   PO2ART 88.2 04/12/2019 0530   HCO3 38.2 (H) 04/12/2019 0530   TCO2 43 (H) 02/20/2019 0410   ACIDBASEDEF (H) 10/13/2006 0430    3.1 CORRECTED ON 10/05 AT 0626: PREVIOUSLY REPORTED AS 2.9   O2SAT 96.9 04/12/2019 0530     Coagulation Profile: Recent Labs  Lab 04/22/19 1723  INR 1.1    Cardiac Enzymes: No results for input(s): CKTOTAL, CKMB, CKMBINDEX, TROPONINI in the last 168 hours.   CBG: Recent Labs  Lab 04/16/19 2328 04/17/19 0328 04/17/19 0727 04/17/19 1254 04/17/19 1556  GLUCAP 115* 121* 115* 77 97    Review of Systems:   Limited due to encephalopathy Review of Systems:   Bolds are positive  Constitutional: weight loss, gain, night sweats, Fevers, chills, fatigue .  HEENT: headaches, Sore throat, sneezing, nasal congestion, post nasal drip, Difficulty swallowing, Tooth/dental problems, visual complaints visual changes, ear ache CV:  chest pain, radiates:,Orthopnea, PND, swelling in lower extremities, dizziness, palpitations, syncope.  GI  heartburn,  indigestion, abdominal pain, nausea, vomiting, diarrhea, change in bowel habits, loss of appetite, bloody stools.  Resp: cough, productive: , hemoptysis, dyspnea, chest pain, pleuritic.  Skin: rash or itching or icterus GU: dysuria, change in color of urine, urgency or frequency. flank pain, hematuria  MS: joint pain or swelling. decreased range of motion  Psych: change in mood or affect. depression or anxiety.  Neuro: difficulty with speech, weakness, numbness, ataxia    Past Medical History  He,  has a past medical history of Bipolar 1 disorder (H[facility]), Bipolar affective (H[facility]), Diabetes mellitus without complication (H[facility]), Hypertension, and Schizophrenic disorder ([facility]).   Surgical History    Past Surgical History:  Procedure Laterality Date  . IR GASTROSTOMY TUBE MOD SED  03/07/2019     Social History   reports that he has been smoking cigarettes. He has a 48.00 pack-year smoking history. He uses smokeless tobacco. He reports current alcohol use. He reports that he does not use drugs.   Family History   His  family history is not on file.   Allergies Allergies  Allergen Reactions  . Chlorhexidine     Unknown reaction-listed on MAR provided      Home Medications  Prior to Admission medications   Medication Sig Start Date End Date Taking? Authorizing Provider  augmented betamethasone dipropionate (DIPROLENE AF) 0.05 % cream Apply 1 application topically 2 (two) times daily. For rash   Yes [provider]  Cholecalciferol (VITAMIN D) 50 MCG (2000 UT) CAPS Take 1 capsule by mouth daily.   Yes [provider]  glycopyrrolate (ROBINUL) 1 MG tablet Place 1 tablet (1 mg total) into feeding tube 3 (three) times daily. Patient taking differently: Take 1 mg by mouth 3 (three) times daily.  04/17/19  Yes Noralee Stain, DO  glycopyrrolate (ROBINUL) 1 MG tablet Take 1 mg by mouth every 8 (eight) hours as needed (FOR INCREASED SECRETIONS).   Yes [provider]    melatonin 3 MG TABS tablet Place 3 tablets (9 mg total) into feeding tube at bedtime. Patient taking differently: Take 3 mg by mouth at bedtime.  04/17/19  Yes Noralee Stain, DO  QUEtiapine (SEROQUEL) 25 MG tablet Place 1 tablet (25 mg total) into feeding tube at bedtime. Patient taking differently: Take 25 mg by mouth at bedtime.  04/17/19  Yes Noralee Stain, DO  traZODone (DESYREL) 50 MG tablet Place 1 tablet (50 mg total) into feeding tube at bedtime. Patient taking differently: Take 50 mg by mouth at bedtime.  04/17/19  Yes Noralee Stain, DO  haloperidol (HALDOL) 1 MG tablet Place 1 tablet (1 mg total) into feeding tube 2 (two) times daily. Patient not taking: Reported on 04/22/2019 04/17/19   Noralee Stain, DO     Critical care time: 35 mins     Joneen Roach, AGACNP-BC [facility]  See Loretha Stapler for personal pager PCCM on call pager [phone]  04/23/2019 1:23 AM

## 2019-04-22 NOTE — ED Notes (Signed)
Pt sat up in bed asking for cake

## 2019-04-22 NOTE — ED Notes (Signed)
[facility] arrived for pt 

## 2019-04-22 NOTE — ED Provider Notes (Signed)
[facility] EMERGENCY DEPARTMENT Provider Note   CSN: [id number] Arrival date & time: 04/22/19  1522     History Chief Complaint  Patient presents with  . Cold Exposure    Marc [name] is a 64 y.o. male.  Marc [name] is a 64 y.o. male with a history of hypertension, diabetes, schizophrenia, bipolar disorder, recent prolonged hospital admission with staph bacteremia with endocarditis, was intubated and trached during admission, discharged from the hospital on 4/8 to [facility].  Limited history available, but facility staff noted the patient was more lethargic than usual, they checked vitals and he had a rectal temp of 94, CBG of 100.  On arrival patient is alert, he is able to state his name, does not know where he is but knows that the month is April.  He denies any focal pain.  Denies specific chest pain, shortness of breath or abdominal pain.  Chronic appearing rash noted.  Attempted to reach the facility for more information, but I have been unable to get a hold of the nursing supervisor.  Majority of history is obtained from chart review.  Able to [facility], nurse states that patient was more lethargic than usual had a dry nonproductive cough, was found to have oral temp of 94, rectal temp read as low.  Chest x-ray at the nursing facility was clear.  MD ordered for patient to be sent to the ED for further evaluation.  Level 5 caveat: Altered mental status          Past Medical History:  Diagnosis Date  . Bipolar 1 disorder (HCC)   . Bipolar affective (HCC)   . Diabetes mellitus without complication (HCC)   . Hypertension   . Schizophrenic disorder [facility](HCC)     Patient Active Problem List   Diagnosis Date Noted  . Hernia of abdominal wall 04/01/2019  . Endocarditis 03/19/2019  . Altered mental status   . Pressure injury of skin 01/29/2019  . Staphylococcus aureus bacteremia 01/23/2019  . Bipolar 1 disorder (HCC)  01/23/2019  . Cigarette smoker 01/23/2019  . Dermatitis 01/23/2019  . Acute respiratory failure (HCC)   . Encephalopathy 01/21/2019  . Schizophrenia (HCC)   . EPIDURAL ABSCESS 01/01/2007  . DM 12/17/2006  . DYSPHAGIA UNSPECIFIED 12/17/2006  . HYPERTENSION NEC 12/17/2006    Past Surgical History:  Procedure Laterality Date  . IR GASTROSTOMY TUBE MOD SED  03/07/2019       No family history on file.  Social History   Tobacco Use  . Smoking status: Current Every Day Smoker    Packs/day: 1.00    Years: 48.00    Pack years: 48.00    Types: Cigarettes  . Smokeless tobacco: Current User  Substance Use Topics  . Alcohol use: Yes    Comment: very little  . Drug use: Never    Home Medications Prior to Admission medications   Medication Sig Start Date End Date Taking? Authorizing Provider  cephALEXin (KEFLEX) 500 MG capsule Take 1 capsule (500 mg total) by mouth 4 (four) times daily. 11/15/18   Cathren LaineSteinl, Kevin, MD  glycopyrrolate (ROBINUL) 1 MG tablet Place 1 tablet (1 mg total) into feeding tube 3 (three) times daily. 04/17/19   Noralee Stainhoi, Jennifer, DO  haloperidol (HALDOL) 1 MG tablet Place 1 tablet (1 mg total) into feeding tube 2 (two) times daily. 04/17/19   Noralee Stainhoi, Jennifer, DO  hydrOXYzine (ATARAX/VISTARIL) 50 MG tablet Take 1 tablet (50 mg total) by mouth  every 6 (six) hours as needed for anxiety. 10/26/17   Micheal Likens, MD  melatonin 3 MG TABS tablet Place 3 tablets (9 mg total) into feeding tube at bedtime. 04/17/19   Noralee Stain, DO  paliperidone (INVEGA SUSTENNA) 156 MG/ML Marc injection Inject 1 mL (156 mg total) into the muscle every 30 (thirty) days. Administer next on 11/23/17 (last administered on 10/26/17). 11/25/17   Micheal Likens, MD  QUEtiapine (SEROQUEL) 25 MG tablet Place 1 tablet (25 mg total) into feeding tube at bedtime. 04/17/19   Noralee Stain, DO  traZODone (DESYREL) 50 MG tablet Place 1 tablet (50 mg total) into feeding tube at bedtime. 04/17/19    Noralee Stain, DO    Allergies    Chlorhexidine  Review of Systems   Review of Systems  Unable to perform ROS: Mental status change    Physical Exam Updated Vital Signs BP 108/67 (BP Location: Left Arm)   Pulse (!) 55   Temp (!) 89.5 F (31.9 C) (Rectal)   Resp 18   Ht 6\' 2"  (1.88 m)   Wt 84 kg   SpO2 95%   BMI 23.78 kg/m   Physical Exam Constitutional:      General: He is not in acute distress.    Appearance: He is ill-appearing. He is not toxic-appearing or diaphoretic.     Comments: Elderly gentleman, ill-appearing, but alert  HENT:     Head: Normocephalic and atraumatic.     Mouth/Throat:     Mouth: Mucous membranes are dry.     Pharynx: Oropharynx is clear.     Comments: Mucous membranes dry, oropharynx clear Eyes:     General:        Left eye: No discharge.     Extraocular Movements: Extraocular movements intact.     Pupils: Pupils are equal, round, and reactive to light.  Neck:     Comments: Previous trach site noted, small amount of mucus drainage Cardiovascular:     Rate and Rhythm: Normal rate and regular rhythm.     Heart sounds: No murmur. No friction rub. No gallop.   Pulmonary:     Effort: Pulmonary effort is normal. No respiratory distress.     Breath sounds: Normal breath sounds. No stridor. No wheezing or rales.     Comments: Decreased air movement noted, but with no wheezes, rales or rhonchi, occasional cough noted, no increased respiratory effort or respiratory distress Abdominal:     General: Abdomen is flat. Bowel sounds are normal. There is no distension.     Palpations: Abdomen is soft. There is no mass.     Tenderness: There is no abdominal tenderness. There is no guarding.     Comments: Abdomen is soft, nondistended, bowel sounds present, no tenderness in all quadrants, no guarding or peritoneal signs.  Feeding tube in place  Genitourinary:    Comments: Soft tissue mass over the mons pubis, likely hernia, penis normal, no scrotal  swelling Musculoskeletal:        General: No deformity.     Cervical back: Neck supple.     Right lower leg: No edema.     Left lower leg: No edema.  Skin:    General: Skin is warm and dry.     Findings: Rash present.     Comments: Chronic appearing rash noted over the trunk, arms and legs  Neurological:     Mental Status: He is alert.     Comments: Patient is alert, speech is  difficult to understand but he is able to state his name and he knows that the month is April, but does not know where he is. Able to move extremities.  Psychiatric:        Mood and Affect: Mood normal.        Behavior: Behavior normal.     ED Results / Procedures / Treatments   Labs (all labs ordered are listed, but only abnormal results are displayed) Labs Reviewed  CBC WITH DIFFERENTIAL/PLATELET - Abnormal; Notable for the following components:      Result Value   WBC 3.8 (*)    RBC 3.98 (*)    Hemoglobin 10.1 (*)    HCT 35.4 (*)    MCH 25.4 (*)    MCHC 28.5 (*)    RDW 18.5 (*)    All other components within normal limits  COMPREHENSIVE METABOLIC PANEL - Abnormal; Notable for the following components:   Glucose, Bld 101 (*)    BUN 34 (*)    Creatinine, Ser 0.58 (*)    Calcium 7.2 (*)    Total Protein 4.6 (*)    Albumin 2.2 (*)    All other components within normal limits  APTT - Abnormal; Notable for the following components:   aPTT 40 (*)    All other components within normal limits  URINALYSIS, ROUTINE W REFLEX MICROSCOPIC - Abnormal; Notable for the following components:   APPearance TURBID (*)    Hgb urine dipstick SMALL (*)    Protein, ur 100 (*)    Leukocytes,Ua SMALL (*)    WBC, UA >50 (*)    Bacteria, UA MANY (*)    All other components within normal limits  HEMOGLOBIN AND HEMATOCRIT, BLOOD - Abnormal; Notable for the following components:   Hemoglobin 10.3 (*)    HCT 35.6 (*)    All other components within normal limits  CULTURE, BLOOD (ROUTINE X 2)  CULTURE, BLOOD (ROUTINE  X 2)  RESPIRATORY PANEL BY RT PCR (FLU A&B, COVID)  URINE CULTURE  LACTIC ACID, PLASMA  LACTIC ACID, PLASMA  PROTIME-INR  CREATININE, SERUM  CBG MONITORING, ED  TYPE AND SCREEN    EKG EKG Interpretation  Date/Time:  Tuesday April 22 2019 16:04:54 EDT Ventricular Rate:  56 PR Interval:    QRS Duration: 96 QT Interval:  508 QTC Calculation: 491 R Axis:   60 Text Interpretation: Sinus rhythm Anteroseptal infarct, age indeterminate When compared with ECG of 10/21/2017, No significant change was found Confirmed by Delora Fuel ([id number]) on 04/22/2019 11:45:15 PM   Radiology DG Abdomen 1 View  Result Date: 04/22/2019 CLINICAL DATA:  Femoral central line placement EXAM: ABDOMEN - 1 VIEW COMPARISON:  CT abdomen pelvis 04/01/2019 FINDINGS: A left lower extremity central venous catheter tip terminates at the level of the left common iliac vein. Foley catheter is in place. Percutaneous gastrostomy tube noted in the left upper quadrant. There is high attenuation contrast media within the bowel, traversing to the level of the rectosigmoid junction. Few scattered colonic diverticular outlined by the contrast media. Portion of the sigmoid protrudes into a left inguinal hernia with a narrowing at the hernia neck but no evidence of high-grade bowel obstruction. No evidence of free intraperitoneal air or portal venous gas. Some streaky atelectatic changes noted in the lung bases. Multilevel degenerative changes in the spine, hips and pelvis. IMPRESSION: 1. Left lower extremity central venous catheter tip terminates at the level of the left common iliac vein. 2. Portion of the colon  again protrudes into the left inguinal hernia with some a narrowing near the hernia defect but no high-grade obstructive bowel gas pattern. Contrast media within the bowel, traversing to the level of the rectosigmoid junction, beyond the narrowing. Electronically Signed   By: Kreg Shropshire M.D.   On: 04/22/2019 19:34   DG Chest  Port 1 View  Result Date: 04/22/2019 CLINICAL DATA:  Sepsis. EXAM: PORTABLE CHEST 1 VIEW COMPARISON:  Chest x-ray dated April 10, 2019. FINDINGS: Interval removal of the tracheostomy tube. The heart size and mediastinal contours are within normal limits. Normal pulmonary vascularity. No focal consolidation, pleural effusion, or pneumothorax. No acute osseous abnormality. Oral contrast at the splenic flexure. IMPRESSION: No active disease. Electronically Signed   By: Obie Dredge M.D.   On: 04/22/2019 17:12    Procedures .Critical Care Performed by: Dartha Lodge, PA-C Authorized by: Dartha Lodge, PA-C   Critical care provider statement:    Critical care time (minutes):  45   Critical care was necessary to treat or prevent imminent or life-threatening deterioration of the following conditions:  Sepsis (Hypothermic, sepsis)   Critical care was time spent personally by me on the following activities:  Discussions with consultants, evaluation of patient's response to treatment, examination of patient, ordering and performing treatments and interventions, ordering and review of laboratory studies, ordering and review of radiographic studies, pulse oximetry, re-evaluation of patient's condition, obtaining history from patient or surrogate and review of old charts   (including critical care time)  Medications Ordered in ED Medications  vancomycin (VANCOREADY) IVPB 2000 mg/400 mL (2,000 mg Intravenous New Bag/Given 04/22/19 1710)  ceFEPIme (MAXIPIME) 2 g in sodium chloride 0.9 % 100 mL IVPB (has no administration in time range)  vancomycin (VANCOREADY) IVPB 1250 mg/250 mL (has no administration in time range)  0.9 %  sodium chloride infusion (has no administration in time range)  norepinephrine (LEVOPHED) 4mg  in premix infusion (2 mcg/min Intravenous New Bag/Given 04/22/19 1806)  ceFEPIme (MAXIPIME) 2 g in sodium chloride 0.9 % 100 mL IVPB (0 g Intravenous Stopped 04/22/19 1701)    metroNIDAZOLE (FLAGYL) IVPB 500 mg (500 mg Intravenous New Bag/Given 04/22/19 1705)  sodium chloride 0.9 % bolus 1,000 mL (1,000 mLs Intravenous New Bag/Given 04/22/19 1627)    And  sodium chloride 0.9 % bolus 1,000 mL (1,000 mLs Intravenous New Bag/Given 04/22/19 1627)    And  sodium chloride 0.9 % bolus 1,000 mL (1,000 mLs Intravenous New Bag/Given 04/22/19 1712)    ED Course  I have reviewed the triage vital signs and the nursing notes.  Pertinent labs & imaging results that were available during my care of the patient were reviewed by me and considered in my medical decision making (see chart for details).    MDM Rules/Calculators/A&P                     64 year old male arrives with altered mental status and hypothermia.  Mildly bradycardic with rectal temp of 89 but vitals otherwise stable on arrival.  Patient is ill-appearing, but is alert and can state his name and the month, does not know where he is and is unable to provide much additional history.  Limited history from facility.  High concern for sepsis, and patient also appears dry and dehydrated.  Code sepsis initiated with broad-spectrum antibiotics and 30 cc/kg fluid bolus.  Bear hugger applied.  Awaiting lab work, IV antibiotics have been started and patient is on his third liter of  IV fluids, but blood pressure has dropped into the 60s.  Reposition cuff but blood pressure remains in the 60s, will start patient on Levophed for hypotension.  Patient is critically ill.  Labs and imaging ordered, reviewed and interpreted by myself. Patient with mild leukopenia of 3.8, stable hemoglobin of 10.1.  Normal renal and liver function, no significant electrolyte derangements aside from calcium of 7.2.  Urinalysis grossly infected with turbid appearance, many bacteria and white blood cell clumps, this is likely the source of patient's sepsis.  Chest x-ray is clear.  Dr. Estell Harpin at bedside to evaluate patient, was able to place a left-sided  femoral central line.  Blood pressure and map improving with Levophed, patient receiving additional fluid boluses.  Will consult critical care.  He is having some intermittent mild bradycardia, but heart rate is remaining stable in the 50s without arrhythmia.  Case discussed with Dr. Arsenio Loader with critical care who is reviewed patient's case and labs, feels that the patient likely has sepsis as well as significant dehydration, is likely not getting good nutrition through feeding tube.  Recommends continuing to give IV fluid boluses.  Feels that patient could potentially remain in the ICU in any pending, but will need to discuss with hospitalist as critical care is not present overnight.  Case discussed with Dr. Cristina Gong with hospitalist who does not feel patient is appropriate to remain at [facility] overnight request admission and transfer to [facility], ICU.  I reconsulted Dr. Arsenio Loader who accepts the patient in transfer to [facility].  While awaiting bed availability he recommends continuing to give IV fluid boluses, after fifth IV fluid bolus recommends rechecking hemoglobin and hematocrit, if hemoglobin is dropped below 7 patient may need packed RBCs.  Patient continues to maintain airway without hypoxia.  Patient has 3 people ahead of him for ICU bed, do not feel that he is stable to remain in the ED here without ICU or critical care support at [facility].  Attempted to wean pressors after fifth fluid bolus, but patient's blood pressure immediately dropped into the 70s, Levophed restarted.  Discussed with Dr. Arsenio Loader and will plan for ED to ED transfer to [facility], ED for ICU team to evaluate patient and assume care.  I discussed case with Dr. Clarice Pole at the [facility], ED who accepts patient for transfer.  Critical care should be paged when patient arrives.  Final Clinical Impression(s) / ED Diagnoses Final diagnoses:  Hypothermia, initial encounter  Hypotension, unspecified hypotension type  Sepsis  secondary to UTI [facility])    Rx / DC Orders ED Discharge Orders    None       Legrand Rams 04/23/19 Tia Masker, MD 04/23/19 1028

## 2019-04-23 ENCOUNTER — Inpatient Hospital Stay (HOSPITAL_COMMUNITY): Payer: Medicare Other

## 2019-04-23 DIAGNOSIS — R6521 Severe sepsis with septic shock: Secondary | ICD-10-CM

## 2019-04-23 DIAGNOSIS — A419 Sepsis, unspecified organism: Principal | ICD-10-CM

## 2019-04-23 LAB — BASIC METABOLIC PANEL
Anion gap: 5 (ref 5–15)
BUN: 26 mg/dL — ABNORMAL HIGH (ref 8–23)
CO2: 29 mmol/L (ref 22–32)
Calcium: 7.6 mg/dL — ABNORMAL LOW (ref 8.9–10.3)
Chloride: 113 mmol/L — ABNORMAL HIGH (ref 98–111)
Creatinine, Ser: 0.67 mg/dL (ref 0.61–1.24)
GFR calc Af Amer: >60 mL/min (ref >60)
GFR calc non Af Amer: >60 mL/min (ref >60)
Glucose, Bld: 90 mg/dL (ref 70–99)
Potassium: 4.3 mmol/L (ref 3.5–5.1)
Sodium: 147 mmol/L — ABNORMAL HIGH (ref 135–145)

## 2019-04-23 LAB — GLUCOSE, CAPILLARY
Glucose-Capillary: 63 mg/dL — ABNORMAL LOW (ref 70–99)
Glucose-Capillary: 101 mg/dL — ABNORMAL HIGH (ref 70–99)
Glucose-Capillary: 80 mg/dL (ref 70–99)
Glucose-Capillary: 101 mg/dL — ABNORMAL HIGH (ref 70–99)

## 2019-04-23 LAB — CBC
HCT: 36.5 % — ABNORMAL LOW (ref 39.0–52.0)
Hemoglobin: 10.4 g/dL — ABNORMAL LOW (ref 13.0–17.0)
MCH: 25 pg — ABNORMAL LOW (ref 26.0–34.0)
MCHC: 28.5 g/dL — ABNORMAL LOW (ref 30.0–36.0)
MCV: 87.7 fL (ref 80.0–100.0)
Platelets: 183 10*3/uL (ref 150–400)
RBC: 4.16 MIL/uL — ABNORMAL LOW (ref 4.22–5.81)
RDW: 18.7 % — ABNORMAL HIGH (ref 11.5–15.5)
WBC: 5.1 10*3/uL (ref 4.0–10.5)
nRBC: 0 % (ref 0.0–0.2)

## 2019-04-23 LAB — CBG MONITORING, ED: Glucose-Capillary: 67 mg/dL — ABNORMAL LOW (ref 70–99)

## 2019-04-23 LAB — MAGNESIUM: Magnesium: 1.9 mg/dL (ref 1.7–2.4)

## 2019-04-23 LAB — ECHOCARDIOGRAM COMPLETE
Height: 74 in
Weight: 2962.98 oz

## 2019-04-23 LAB — TSH: TSH: 2.153 u[IU]/mL (ref 0.350–4.500)

## 2019-04-23 LAB — PHOSPHORUS: Phosphorus: 3.7 mg/dL (ref 2.5–4.6)

## 2019-04-23 LAB — T4, FREE: Free T4: 1.05 ng/dL (ref 0.61–1.12)

## 2019-04-23 MED ORDER — ALBUMIN HUMAN 25 % IV SOLN
25.0000 g | Freq: Four times a day (QID) | INTRAVENOUS | Status: AC
  Administered 2019-04-23 – 2019-04-24 (×4): 25 g via INTRAVENOUS
  Filled 2019-04-23 (×4): qty 100

## 2019-04-23 MED ORDER — RESOURCE THICKENUP CLEAR PO POWD
ORAL | Status: DC | PRN
  Filled 2019-04-23: qty 125

## 2019-04-23 MED ORDER — QUETIAPINE FUMARATE 25 MG PO TABS
25.0000 mg | ORAL_TABLET | Freq: Every day | ORAL | Status: DC
  Administered 2019-04-23 – 2019-05-01 (×8): 25 mg
  Filled 2019-04-23 (×8): qty 1

## 2019-04-23 MED ORDER — HEPARIN SODIUM (PORCINE) 5000 UNIT/ML IJ SOLN
5000.0000 [IU] | Freq: Three times a day (TID) | INTRAMUSCULAR | Status: DC
  Administered 2019-04-23 – 2019-05-02 (×26): 5000 [IU] via SUBCUTANEOUS
  Filled 2019-04-23 (×25): qty 1

## 2019-04-23 MED ORDER — GLYCOPYRROLATE 1 MG PO TABS
1.0000 mg | ORAL_TABLET | Freq: Three times a day (TID) | ORAL | Status: DC
  Administered 2019-04-23 – 2019-05-02 (×28): 1 mg
  Filled 2019-04-23 (×32): qty 1

## 2019-04-23 MED ORDER — FENTANYL CITRATE (PF) 100 MCG/2ML IJ SOLN
25.0000 ug | Freq: Once | INTRAMUSCULAR | Status: AC
  Administered 2019-04-23: 25 ug via INTRAVENOUS
  Filled 2019-04-23: qty 2

## 2019-04-23 MED ORDER — DEXTROSE 50 % IV SOLN
50.0000 mL | Freq: Once | INTRAVENOUS | Status: AC
  Administered 2019-04-23: 50 mL via INTRAVENOUS

## 2019-04-23 MED ORDER — SODIUM CHLORIDE 0.9 % IV BOLUS
1000.0000 mL | Freq: Once | INTRAVENOUS | Status: AC
  Administered 2019-04-23: 1000 mL via INTRAVENOUS

## 2019-04-23 MED ORDER — HALOPERIDOL 1 MG PO TABS
1.0000 mg | ORAL_TABLET | Freq: Two times a day (BID) | ORAL | Status: DC
  Administered 2019-04-23 – 2019-05-02 (×18): 1 mg
  Filled 2019-04-23 (×21): qty 1

## 2019-04-23 NOTE — ED Notes (Signed)
MD made aware and responded of CBG 67, no Dextrose orders at this time

## 2019-04-23 NOTE — ED Provider Notes (Signed)
.  Central Line  Date/Time: 04/23/2019 10:26 AM Performed by: Bethann Berkshire, MD Authorized by: Bethann Berkshire, MD   Consent:    Consent obtained:  Verbal   Consent given by:  Patient   Risks discussed:  Arterial puncture and incorrect placement   Alternatives discussed:  No treatment Pre-procedure details:    Hand hygiene: Hand hygiene performed prior to insertion     Sterile barrier technique: All elements of maximal sterile technique followed     Skin preparation:  2% chlorhexidine   Skin preparation agent: Skin preparation agent completely dried prior to procedure   Anesthesia (see MAR for exact dosages):    Anesthesia method:  None Procedure details:    Location:  L femoral   Patient position:  Flat   Procedural supplies:  Triple lumen   Landmarks identified: yes     Ultrasound guidance: no     Number of attempts:  4 Post-procedure details:    Post-procedure:  Dressing applied   Assessment:  Blood return through all ports and free fluid flow   Patient tolerance of procedure:  Tolerated well, no immediate complications Comments:     Patient had a femoral line placed in his left femoral vein.  Initial attempts to place the central line in his right femoral were unsuccessful.  Attempts to place in his left femoral vein was successful      Bethann Berkshire, MD 04/23/19 1027

## 2019-04-23 NOTE — ED Notes (Signed)
Pt allowed to ice chips (few) per MD ok.

## 2019-04-23 NOTE — Progress Notes (Signed)
Hypoglycemic Event  CBG: 63  Treatment: D50 50 mL (25 gm)  Symptoms: None  Follow-up CBG: 101  Possible Reasons for Event: Inadequate meal intake  Comments/MD notified:protocol followed    Marc [name]

## 2019-04-23 NOTE — ED Notes (Signed)
Attempted report to 2H 

## 2019-04-23 NOTE — Progress Notes (Signed)
eLink Physician-Brief Progress Note Patient Name: Marc [name] DOB: 1955-05-11 MRN: [id number]   Date of Service  04/23/2019  HPI/Events of Note  Remains on Norepinephrine IV infusion at 5 mcg/min.   eICU Interventions  Will order: 1. Bolus with 0.9 NaCl 1 liter IV over 1 hour now.  2. Attempt to wean Norepinephrine IV infusion.      Intervention Category Major Interventions: Hypotension - evaluation and management  Sommer,Steven Eugene 04/23/2019, 5:22 AM

## 2019-04-23 NOTE — ED Provider Notes (Signed)
Patient transferred from [facility] awaiting ICU bed placement.  Found to be hypothermic, likely septic.  Has received 6 L IV fluids and empiric antibiotics.  Required Levophed to maintain blood pressures. Physical Exam  BP 117/81   Pulse (!) 53   Temp (!) 91.6 F (33.1 C)   Resp 18   Ht 6\' 2"  (1.88 m)   Wt 84 kg   SpO2 100%   BMI 23.78 kg/m   Physical Exam Vitals and nursing note reviewed.  Constitutional:      General: He is not in acute distress.    Appearance: He is well-developed.  HENT:     Head: Normocephalic and atraumatic.  Eyes:     General:        Right eye: No discharge.        Left eye: No discharge.     Conjunctiva/sclera: Conjunctivae normal.  Neck:     Vascular: No JVD.     Trachea: No tracheal deviation.  Cardiovascular:     Rate and Rhythm: Normal rate.  Pulmonary:     Effort: Pulmonary effort is normal.     Comments: Globally diminished breath sounds Abdominal:     General: There is no distension.     Comments: PEG tube in the left upper quadrant of the abdomen, no abnormal drainage or tenderness.  Skin:    Findings: Erythema present. No rash.     Comments: Diffuse scaly rash noted to the neck, chest, lower extremities  Neurological:     Mental Status: He is alert.     Comments: Mildly confused, oriented to person and time but not place.  He knows that is the president.  No facial droop noted.  Psychiatric:        Behavior: Behavior normal.     ED Course/Procedures     Procedures  MDM  12:50am Spoke with Dr. Duane Boston with intensivist service, informed him of patient's arrival to [facility], ED.  He will send the ground team to evaluate the patient emergently.       [facility], PA-C 04/23/19 0110    04/25/19, MD 04/23/19 [phone]

## 2019-04-23 NOTE — Progress Notes (Signed)
NAME:  Marc [name], MRN:  [id number], DOB:  25-May-1955, LOS: 1 ADMISSION DATE:  04/22/2019, CONSULTATION DATE:  4/13 REFERRING MD:  Dr. Estell Harpin EDP Jeani Hawking, CHIEF COMPLAINT:  Septic shock   Brief History   64 year old male with recent prolonged admission with MSSA endocarditis during which he required long term intubation and trach. Discharged 4/8. Now admitted 4/13 for presumed septic shock.   History of present illness   64 year old male with PMH as below, which is significant for bipolar disorder, diabetes, hypertension, and schizophrenia.  He also had a recent prolonged admission from January 12 to February 8 of this year during which she was found to have mitral valve MSSA endocarditis resulting in septic shock.  He was initially admitted to the ICU and treated with IV cefazolin.  Antibiotics ended on 2/26.  Unfortunately his course was complicated by respiratory failure requiring long-term intubation and ultimately tracheostomy.  He was able to be decannulated on 4/5.  Course also complicated by altered mental status felt at least in some part to be due to hospital induced delirium and this remained at the time of discharge.  He was also treated for Providencia rettgeri UTI with ceftriaxone course completed 3/30.  He was discharged to [facility] on 4/8.  On 4/13 [facility] staff noted increased lethargy and on vitals check he was noted to have rectal temperature of 94.  He was transferred to [facility] emergency department for further evaluation.  In the ED he was noted to have altered mental status, hypothermia, and mild bradycardia.  He did eventually become hypotensive requiring low-dose norepinephrine infusion.  Central line placed in the ED.  Laboratory evaluation significant for WBC 3.8, hemoglobin 10.1, and urinalysis with many bacteria and small leukocytes.  He was started on empiric antibiotics.  He was transferred to [facility] under PCCM for ICU admission.   Past  Medical History   has a past medical history of Bipolar 1 disorder (HCC), Bipolar affective (HCC), Diabetes mellitus without complication (HCC), Hypertension, and Schizophrenic disorder (HCC).   Significant Hospital Events   1/12 >4/8 admission for MSSA endocarditis.   Consults:    Procedures:  CVL 4/13 >  Significant Diagnostic Tests:  CXR neg AXR neg  Micro Data:  BC 4/13 > Urine 4/13 >  Antimicrobials:  Cefepime 4/13 > Vancomycin 4/13 >  Interim history/subjective:  No events. Wants water. Minimal levophed requirement.  Objective   Blood pressure 99/65, pulse 84, temperature (!) 96.4 F (35.8 C), resp. rate (!) 21, height 6\' 2"  (1.88 m), weight 84 kg, SpO2 94 %.        Intake/Output Summary (Last 24 hours) at 04/23/2019 1054 Last data filed at 04/23/2019 0910 Gross per 24 hour  Intake 1100 ml  Output --  Net 1100 ml   Filed Weights   04/22/19 1526  Weight: 84 kg    Examination: General: Frail adult male who appears older than stated age HENT: [place]/AT, recent trach site with surrounding skin sloughing, no overt infection Lungs: Clear, no wheezing Cardiovascular: RRR, no obvious murmur Abdomen: Soft, NT, ND, PEG in place Extremities: No acute deformity Neuro: AO to self, place, time GU: Foley  Resolved Hospital Problem list     Assessment & Plan:   Septic shock: presumably secondary to urosepsis and poor PO. Lactic acid only 1 in ED.  - Albumin 100g 25 % over 24h - Continue levophed for MAP goal 65 - Empiric cefepime, vancomycin -  Cultures pending  Acute metabolic encephalopathy: he is at baseline from prior stay  Recent history of MSSA endocarditis: completed ABX in March.  - Check echo - BC pending   DM, hypoglycemic - CBG monitoring and SSI - Resume dysphagia diet, supplement with TF PRN  Bipolar disorder Schizophrenia  - Resume home seroquel and haldol, check AM EKG given borderline long QT on admission   Best practice:  Diet:  Dysphagia 1, nectar thick liquid per last SLP note Pain/Anxiety/Delirium protocol (if indicated): NA VAP protocol (if indicated): NA DVT prophylaxis: heparin GI prophylaxis: NA Glucose control: SSI Mobility: BR Code Status: FULL Family Communication: updated patient Disposition: ICU pending pressor liberation    The patient is critically ill with multiple organ systems failure and requires high complexity decision making for assessment and support, frequent evaluation and titration of therapies, application of advanced monitoring technologies and extensive interpretation of multiple databases. Critical Care Time devoted to patient care services described in this note independent of APP/resident time (if applicable)  is 32 minutes.   Erskine Emery MD [facility] 04/23/2019 11:00 AM Personal pager: [phone] If unanswered, please page CCM On-call: [phone]

## 2019-04-23 NOTE — ED Notes (Signed)
Report given to Haley RN.

## 2019-04-23 NOTE — ED Notes (Signed)
Upon this RNs Hourly Rounding; this RN found this pt in the bed without the blankets on and attempting to remove himself from the stretcher. This RN educated this pt about having to stay in the bed due to nature of situation and seriousness of his health. This pt understood the education and was agreeable.

## 2019-04-23 NOTE — Progress Notes (Signed)
  Echocardiogram 2D Echocardiogram has been performed.  Marc [name] 04/23/2019, 11:51 AM

## 2019-04-24 ENCOUNTER — Inpatient Hospital Stay (HOSPITAL_COMMUNITY): Payer: Medicare Other

## 2019-04-24 LAB — BASIC METABOLIC PANEL
Anion gap: 6 (ref 5–15)
BUN: 20 mg/dL (ref 8–23)
CO2: 28 mmol/L (ref 22–32)
Calcium: 7.9 mg/dL — ABNORMAL LOW (ref 8.9–10.3)
Chloride: 114 mmol/L — ABNORMAL HIGH (ref 98–111)
Creatinine, Ser: 0.83 mg/dL (ref 0.61–1.24)
GFR calc Af Amer: >60 mL/min (ref >60)
GFR calc non Af Amer: >60 mL/min (ref >60)
Glucose, Bld: 67 mg/dL — ABNORMAL LOW (ref 70–99)
Potassium: 3.6 mmol/L (ref 3.5–5.1)
Sodium: 148 mmol/L — ABNORMAL HIGH (ref 135–145)

## 2019-04-24 LAB — GLUCOSE, CAPILLARY
Glucose-Capillary: 125 mg/dL — ABNORMAL HIGH (ref 70–99)
Glucose-Capillary: 71 mg/dL (ref 70–99)
Glucose-Capillary: 71 mg/dL (ref 70–99)
Glucose-Capillary: 81 mg/dL (ref 70–99)
Glucose-Capillary: 85 mg/dL (ref 70–99)
Glucose-Capillary: 89 mg/dL (ref 70–99)
Glucose-Capillary: 91 mg/dL (ref 70–99)

## 2019-04-24 LAB — CBC
HCT: 30.7 % — ABNORMAL LOW (ref 39.0–52.0)
Hemoglobin: 8.8 g/dL — ABNORMAL LOW (ref 13.0–17.0)
MCH: 25.7 pg — ABNORMAL LOW (ref 26.0–34.0)
MCHC: 28.7 g/dL — ABNORMAL LOW (ref 30.0–36.0)
MCV: 89.5 fL (ref 80.0–100.0)
Platelets: 151 10*3/uL (ref 150–400)
RBC: 3.43 MIL/uL — ABNORMAL LOW (ref 4.22–5.81)
RDW: 18.8 % — ABNORMAL HIGH (ref 11.5–15.5)
WBC: 4 10*3/uL (ref 4.0–10.5)
nRBC: 0 % (ref 0.0–0.2)

## 2019-04-24 LAB — MAGNESIUM: Magnesium: 2.2 mg/dL (ref 1.7–2.4)

## 2019-04-24 MED ORDER — FENTANYL 2500MCG IN NS 250ML (10MCG/ML) PREMIX INFUSION
0.0000 ug/h | INTRAVENOUS | Status: DC
  Administered 2019-04-24: 25 ug/h via INTRAVENOUS
  Administered 2019-04-26: 60 ug/h via INTRAVENOUS
  Administered 2019-04-26: 50 ug/h via INTRAVENOUS
  Administered 2019-04-27: 80 ug/h via INTRAVENOUS
  Filled 2019-04-24 (×4): qty 250

## 2019-04-24 MED ORDER — FENTANYL CITRATE (PF) 100 MCG/2ML IJ SOLN: 50.0000 ug | INTRAMUSCULAR | Status: DC | PRN

## 2019-04-24 MED ORDER — PRO-STAT SUGAR FREE PO LIQD
30.0000 mL | Freq: Two times a day (BID) | ORAL | Status: DC
  Administered 2019-04-24 – 2019-05-01 (×15): 30 mL
  Filled 2019-04-24 (×15): qty 30

## 2019-04-24 MED ORDER — JEVITY 1.5 CAL/FIBER PO LIQD
1000.0000 mL | ORAL | Status: DC
  Administered 2019-04-24: 1000 mL via ORAL
  Filled 2019-04-24: qty 1000

## 2019-04-24 MED ORDER — ETOMIDATE 2 MG/ML IV SOLN
INTRAVENOUS | Status: AC
  Administered 2019-04-24: 20 mg via INTRAVENOUS
  Filled 2019-04-24: qty 10

## 2019-04-24 MED ORDER — FAMOTIDINE IN NACL 20-0.9 MG/50ML-% IV SOLN
20.0000 mg | Freq: Two times a day (BID) | INTRAVENOUS | Status: DC
  Administered 2019-04-24 – 2019-05-02 (×16): 20 mg via INTRAVENOUS
  Filled 2019-04-24 (×16): qty 50

## 2019-04-24 MED ORDER — ROCURONIUM BROMIDE 10 MG/ML (PF) SYRINGE
PREFILLED_SYRINGE | INTRAVENOUS | Status: AC
  Administered 2019-04-24: 100 mg
  Filled 2019-04-24: qty 10

## 2019-04-24 MED ORDER — PROPOFOL 500 MG/50ML IV EMUL
INTRAVENOUS | Status: AC
  Administered 2019-04-24: 20 ug/kg/min via INTRAVENOUS
  Filled 2019-04-24: qty 50

## 2019-04-24 MED ORDER — MIDODRINE HCL 5 MG PO TABS
5.0000 mg | ORAL_TABLET | Freq: Three times a day (TID) | ORAL | Status: DC
  Administered 2019-04-24 – 2019-04-29 (×16): 5 mg via ORAL
  Filled 2019-04-24 (×17): qty 1

## 2019-04-24 MED ORDER — NOREPINEPHRINE 4 MG/250ML-% IV SOLN
0.0000 ug/min | INTRAVENOUS | Status: DC
  Administered 2019-04-24: 6 ug/min via INTRAVENOUS
  Administered 2019-04-25: 3 ug/min via INTRAVENOUS
  Administered 2019-04-25 – 2019-04-26 (×3): 2 ug/min via INTRAVENOUS
  Filled 2019-04-24 (×2): qty 250

## 2019-04-24 MED ORDER — POTASSIUM CHLORIDE 20 MEQ/15ML (10%) PO SOLN
40.0000 meq | Freq: Once | ORAL | Status: AC
  Administered 2019-04-24: 40 meq
  Filled 2019-04-24: qty 30

## 2019-04-24 MED ORDER — MAGNESIUM SULFATE 2 GM/50ML IV SOLN
2.0000 g | Freq: Once | INTRAVENOUS | Status: AC
  Administered 2019-04-24: 2 g via INTRAVENOUS
  Filled 2019-04-24: qty 50

## 2019-04-24 MED ORDER — POLYETHYLENE GLYCOL 3350 17 G PO PACK
17.0000 g | PACK | Freq: Every day | ORAL | Status: DC
  Administered 2019-04-25 – 2019-05-02 (×7): 17 g via ORAL
  Filled 2019-04-24 (×7): qty 1

## 2019-04-24 MED ORDER — PROPOFOL 1000 MG/100ML IV EMUL
0.0000 ug/kg/min | INTRAVENOUS | Status: DC
  Administered 2019-04-24: 20 ug/kg/min via INTRAVENOUS
  Administered 2019-04-25: 30 ug/kg/min via INTRAVENOUS
  Administered 2019-04-25 (×2): 35 ug/kg/min via INTRAVENOUS
  Administered 2019-04-25: 20 ug/kg/min via INTRAVENOUS
  Administered 2019-04-26 (×2): 10 ug/kg/min via INTRAVENOUS
  Filled 2019-04-24 (×7): qty 100

## 2019-04-24 MED ORDER — ROCURONIUM BROMIDE 50 MG/5ML IV SOLN
100.0000 mg | Freq: Once | INTRAVENOUS | Status: DC
  Filled 2019-04-24: qty 10

## 2019-04-24 MED ORDER — VITAL HIGH PROTEIN PO LIQD
1000.0000 mL | ORAL | Status: DC
  Administered 2019-04-24: 1000 mL

## 2019-04-24 MED ORDER — FREE WATER
200.0000 mL | Freq: Three times a day (TID) | Status: DC
  Administered 2019-04-24 – 2019-04-26 (×7): 200 mL

## 2019-04-24 MED ORDER — FENTANYL CITRATE (PF) 100 MCG/2ML IJ SOLN
INTRAMUSCULAR | Status: AC
  Administered 2019-04-24: 50 ug via INTRAVENOUS
  Filled 2019-04-24: qty 2

## 2019-04-24 MED ORDER — DOCUSATE SODIUM 50 MG/5ML PO LIQD
100.0000 mg | Freq: Two times a day (BID) | ORAL | Status: DC
  Administered 2019-04-25 – 2019-04-29 (×10): 100 mg via ORAL
  Filled 2019-04-24 (×12): qty 10

## 2019-04-24 MED ORDER — ETOMIDATE 2 MG/ML IV SOLN: 20.0000 mg | Freq: Once | INTRAVENOUS | Status: AC

## 2019-04-24 NOTE — Progress Notes (Addendum)
eLink Physician-Brief Progress Note Patient Name: [name] DOB: 17-Oct-1955 MRN: [id number]   Date of Service  04/24/2019  HPI/Events of Note  Reviewed CXR and KUB obtained post-intubation. ETT is slightly high (~6.5cm above carina). Enteric tube is correctly positioned.  Patient had some hypotension peri-intubation in the setting of sedative medications and his acidosis. Requiring levophed drip currently.   eICU Interventions  OK to use enteric tube. Order entered for RT to advance the ETT by 2cm. Fentanyl and levophed drips ordered. Post-intubation ABG pending.     Intervention Category Intermediate Interventions: Diagnostic test evaluation  Marc [name] 04/24/2019, 8:55 PM

## 2019-04-24 NOTE — Progress Notes (Signed)
Spoke with CN on 5W.  Concerns with note from MD at 922am that mentions Step down orders later on.  MD orders to the bedside RN April this afternoon reflect pt's improvement and ability to transfer to medical tele.    Lonia Blood, RN

## 2019-04-24 NOTE — Progress Notes (Signed)
Upon arrival for my shift pt had a wet cough and would occasionally desat to 88% on 3L O2. Shortly after 1900 pt began desating to the 70s requiring oral and NT suction. RN removed large amounts of secretions but pt's oxygen requirements were increasing. Pt was placed on a non-rebreather and respiratory therapy was called to bedside. RT attempted NT suctioning and pt became more drowsy and lethargic. Ultimately becoming unresponsive RT drew an ABG and Elink was notified. MD video chatted into room and ordered RN and RT to prep for RSI. Bedside physician in route.

## 2019-04-24 NOTE — Progress Notes (Addendum)
Initial Nutrition Assessment  DOCUMENTATION CODES:   Not applicable  INTERVENTION:   Tube feeding:  -Jevity 1.5 @ 20 ml/hr via PEG -Increase per toleration to goal rate of 60 ml/hr (1440 ml) -30 ml Prostat BID -Free water flushes 200 ml Q8 hrs- per CCM  Provides: 2360 kcals, 122 grams protein, 1094 ml free water (1694 ml with flushes)  NUTRITION DIAGNOSIS:   Increased nutrient needs related to wound healing as evidenced by estimated needs.  GOAL:   Patient will meet greater than or equal to 90% of their needs  MONITOR:   PO intake, Supplement acceptance, Weight trends, Labs, I & O's, Diet advancement, Skin, TF tolerance  REASON FOR ASSESSMENT:   Consult Enteral/tube feeding initiation and management  ASSESSMENT:   Patient with PMH significant for bipolar disorder, DM, HTN, schizophrenia, mitral valve MSSA endocarditis, s/p trach with eventually decannulation, and s/p PEG. Presents this admission with septic shock due to presumed urosepsis.   Unable to obtain history from pt. Last SLP note on 4/8 recommends DYS1 with nectar thick liquids. Last three meal completions charted as 50%, 50%, and 100%. Recommend meeting 100% of needs with tube feeding until pt shows consistent meal intake (prior admission intake waxed/waned). Hypoglycemia improved with initiation of Vital High Protein @ 20 ml/hr. Change to home feeding formula of Jevity 1.5.   Weight shows to be stable from 84.3 kg on 4/8 to 89.8 kg this admission. Suspect fluid component. Free water flushes added for hypernatremia per CCM.   I/O: +1,859 ml since admit UOP: 1,100 ml x 24 hrs   Drips: levophed Labs: Na 148 (H) CBG 67-101  Diet Order:   Diet Order            DIET - DYS 1 Room service appropriate? Yes; Fluid consistency: Nectar Thick  Diet effective now              EDUCATION NEEDS:   Not appropriate for education at this time  Skin:  Skin Assessment: Skin Integrity Issues: Skin Integrity Issues::  Stage I Stage I: R thigh  Last BM:  PTA  Height:   Ht Readings from Last 1 Encounters:  04/22/19 6\' 2"  (1.88 m)    Weight:   Wt Readings from Last 1 Encounters:  04/24/19 89.8 kg    BMI:  Body mass index is 25.42 kg/m.  Estimated Nutritional Needs:   Kcal:  2300-2500 kcal  Protein:  115-130 grams  Fluid:  >/= 2.3 L/day  04/26/19 RD, LDN Clinical Nutrition Pager listed in AMION

## 2019-04-24 NOTE — Progress Notes (Signed)
eLink Physician-Brief Progress Note Patient Name: Marc [name] DOB: August 06, 1955 MRN: [id number]   Date of Service  04/24/2019  HPI/Events of Note  Called for unresponsiveness in this patient following an aspiration episode. RT reports an ABG with pH 6.9 and PaCO2 > 100.   eICU Interventions  I immediately requested that RT begin bag-mask ventilation while I contacted the bedside team (Dr. Jerolyn Center) to come and intubate the patient for combined hypoxemic and hypercarbic respiratory failure. Etomidate and rocuronium were ordered for intubation. Dr. Jerolyn Center is now at bedside so further mgmt will be deferred to him.      Intervention Category Major Interventions: Respiratory failure - evaluation and management  Janae Bridgeman 04/24/2019, 8:11 PM

## 2019-04-24 NOTE — Progress Notes (Signed)
ABG done at 2005. Results unable to cross over into Epic due to CO2 above reportable range. ABG results: PH 6.94, CO2 >97, PAO2 106 Critical on the way, MD ware.

## 2019-04-24 NOTE — Procedures (Signed)
Intubation Procedure Note Marc [name] [id number] [birth year]  Procedure: Intubation Indications: Airway protection and hypercarbic respiratory failure  Procedure Details Consent: Risks of procedure as well as the alternatives and risks of each were explained to the (patient/caregiver).  Consent for procedure obtained. and Unable to obtain consent because of emergent medical necessity. Time Out: Verified patient identification, verified procedure, site/side was marked, verified correct patient position, special equipment/implants available, medications/allergies/relevent history reviewed, required imaging and test results available.  Performed  Maximum sterile technique was used including gloves, gown and mask.   Etomidate 20 mg, Rocuronium 100 mg G1 view with Mac 3   Evaluation Hemodynamic Status: BP stable throughout; O2 sats: stable throughout Patient's Current Condition: stable Complications: No apparent complications Patient did tolerate procedure well. Chest X-ray ordered to verify placement.  CXR: pending.   Marc [name] 04/24/2019

## 2019-04-24 NOTE — Progress Notes (Signed)
Pt's spouse [name] was called and updated on pt status and intubation this evening. All questions were answered to her satisfaction.

## 2019-04-24 NOTE — Progress Notes (Signed)
   NAME:  Marc [name], MRN:  [id number], DOB:  March 08, 1955, LOS: 2 ADMISSION DATE:  04/22/2019, CONSULTATION DATE:  4/13 REFERRING MD:  Dr. Estell Harpin EDP Jeani Hawking, CHIEF COMPLAINT:  Septic shock   Brief History   63 year old male with recent prolonged admission with MSSA endocarditis during which he required long term intubation and trach. Discharged 4/8. Now admitted 4/13 for presumed septic shock.   Past Medical History   has a past medical history of Bipolar 1 disorder (HCC), Bipolar affective (HCC), Diabetes mellitus without complication (HCC), Hypertension, and Schizophrenic disorder (HCC).  Significant Hospital Events   1/12 >4/8 admission for MSSA endocarditis.   Consults:  None  Procedures:  CVL 4/13 >  Significant Diagnostic Tests:  CXR neg AXR neg  Micro Data:  BC 4/13 > Urine 4/13 > Growing gram negative rods  Antimicrobials:  Cefepime 4/13 > Vancomycin 4/13 >  Interim history/subjective:  Wondering if it is morning or night. Hungry. Wants something to drink. No other complaints  Objective   Blood pressure 111/67, pulse 61, temperature 98.1 F (36.7 C), resp. rate 13, height 6\' 2"  (1.88 m), weight 89.8 kg, SpO2 100 %.        Intake/Output Summary (Last 24 hours) at 04/24/2019 0601 Last data filed at 04/24/2019 0500 Gross per 24 hour  Intake 2464.84 ml  Output 960 ml  Net 1504.84 ml   Filed Weights   04/22/19 1526 04/24/19 0500  Weight: 84 kg 89.8 kg   Examination: General: Well nourished male in no acute distress HENT: Normocephalic, atraumatic, moist mucus membranes Pulm: Good air movement with no wheezing or crackles  CV: RRR, no murmurs, no rubs  Abdomen: Active bowel sounds, soft, non-distended, no tenderness to palpation  Extremities: Pulses palpable in all extremities, no LE edema  Skin: Warm and dry  Neuro: Alert, confused  Resolved Hospital Problem list     Assessment & Plan:   Septic shock: presumably secondary to urosepsis and  poor PO. Lactic acid only 1 in ED.  -- Urine cultures growing gram negative rods. Will narrow Vancomycin and cefepime pending results.  -- Blood cultures with NGTD -- Currently on 6 mcg/hr of levophed. Wean to maintain MAP > 65  Acute metabolic encephalopathy: At baseline  Recent history of MSSA endocarditis: completed ABX in March.  -- Echo without evidence of vegetations  -- BC with NGTD  DM, hypoglycemic -- Hypoglycemia overnight  -- CBG monitoring and SSI -- Continue dysphagia diet, supplement with TF PRN  Bipolar disorder Schizophrenia  -- Continue home seroquel and haldol -- Check QTc today  Best practice:  Diet: Dysphagia 1, nectar thick liquid per last SLP note Pain/Anxiety/Delirium protocol (if indicated): NA VAP protocol (if indicated): NA DVT prophylaxis: heparin GI prophylaxis: NA Glucose control: SSI Mobility: BR Code Status: FULL Family Communication: updated patient Disposition: ICU pending pressor liberation  Please see attending attestation for final recommendations.   April, MD  [facility] PGY3  Pager: [phone]

## 2019-04-24 NOTE — Plan of Care (Signed)
Patients wife was called, no answer, unable to leave VM. Son was called and updated about intubation.

## 2019-04-25 DIAGNOSIS — N390 Urinary tract infection, site not specified: Secondary | ICD-10-CM

## 2019-04-25 DIAGNOSIS — I959 Hypotension, unspecified: Secondary | ICD-10-CM

## 2019-04-25 DIAGNOSIS — Z789 Other specified health status: Secondary | ICD-10-CM

## 2019-04-25 LAB — BASIC METABOLIC PANEL
Anion gap: 9 (ref 5–15)
BUN: 27 mg/dL — ABNORMAL HIGH (ref 8–23)
CO2: 25 mmol/L (ref 22–32)
Calcium: 8.8 mg/dL — ABNORMAL LOW (ref 8.9–10.3)
Chloride: 114 mmol/L — ABNORMAL HIGH (ref 98–111)
Creatinine, Ser: 0.97 mg/dL (ref 0.61–1.24)
GFR calc Af Amer: >60 mL/min (ref >60)
GFR calc non Af Amer: >60 mL/min (ref >60)
Glucose, Bld: 96 mg/dL (ref 70–99)
Potassium: 4.1 mmol/L (ref 3.5–5.1)
Sodium: 148 mmol/L — ABNORMAL HIGH (ref 135–145)

## 2019-04-25 LAB — POCT I-STAT 7, (LYTES, BLD GAS, ICA,H+H)
Acid-Base Excess: 4 mmol/L — ABNORMAL HIGH (ref 0.0–2.0)
Bicarbonate: 30 mmol/L — ABNORMAL HIGH (ref 20.0–28.0)
Calcium, Ion: 1.23 mmol/L (ref 1.15–1.40)
HCT: 32 % — ABNORMAL LOW (ref 39.0–52.0)
Hemoglobin: 10.9 g/dL — ABNORMAL LOW (ref 13.0–17.0)
O2 Saturation: 100 %
Patient temperature: 97.6
Potassium: 4.5 mmol/L (ref 3.5–5.1)
Sodium: 149 mmol/L — ABNORMAL HIGH (ref 135–145)
TCO2: 32 mmol/L (ref 22–32)
pCO2 arterial: 50.2 mmHg — ABNORMAL HIGH (ref 32.0–48.0)
pH, Arterial: 7.382 (ref 7.350–7.450)
pO2, Arterial: 541 mmHg — ABNORMAL HIGH (ref 83.0–108.0)

## 2019-04-25 LAB — CBC
HCT: 32.5 % — ABNORMAL LOW (ref 39.0–52.0)
Hemoglobin: 9.4 g/dL — ABNORMAL LOW (ref 13.0–17.0)
MCH: 25.5 pg — ABNORMAL LOW (ref 26.0–34.0)
MCHC: 28.9 g/dL — ABNORMAL LOW (ref 30.0–36.0)
MCV: 88.3 fL (ref 80.0–100.0)
Platelets: 141 10*3/uL — ABNORMAL LOW (ref 150–400)
RBC: 3.68 MIL/uL — ABNORMAL LOW (ref 4.22–5.81)
RDW: 19 % — ABNORMAL HIGH (ref 11.5–15.5)
WBC: 9.6 10*3/uL (ref 4.0–10.5)
nRBC: 0.3 % — ABNORMAL HIGH (ref 0.0–0.2)

## 2019-04-25 LAB — GLUCOSE, CAPILLARY
Glucose-Capillary: 77 mg/dL (ref 70–99)
Glucose-Capillary: 83 mg/dL (ref 70–99)
Glucose-Capillary: 88 mg/dL (ref 70–99)
Glucose-Capillary: 91 mg/dL (ref 70–99)
Glucose-Capillary: 93 mg/dL (ref 70–99)
Glucose-Capillary: 93 mg/dL (ref 70–99)

## 2019-04-25 LAB — URINE CULTURE: Culture: >=100000 — AB

## 2019-04-25 LAB — TRIGLYCERIDES: Triglycerides: 87 mg/dL (ref <150)

## 2019-04-25 MED ORDER — ORAL CARE MOUTH RINSE
15.0000 mL | OROMUCOSAL | Status: DC
  Administered 2019-04-25 – 2019-04-27 (×23): 15 mL via OROMUCOSAL

## 2019-04-25 MED ORDER — SODIUM CHLORIDE 0.9% FLUSH: 10.0000 mL | INTRAVENOUS | Status: DC | PRN

## 2019-04-25 MED ORDER — VITAL AF 1.2 CAL PO LIQD
1000.0000 mL | ORAL | Status: DC
  Administered 2019-04-25 – 2019-04-28 (×4): 1000 mL
  Filled 2019-04-25: qty 1000

## 2019-04-25 MED ORDER — SODIUM CHLORIDE 0.9% FLUSH
10.0000 mL | Freq: Two times a day (BID) | INTRAVENOUS | Status: DC
  Administered 2019-04-25 – 2019-05-02 (×10): 10 mL

## 2019-04-25 NOTE — Progress Notes (Addendum)
Nutrition Follow-up  DOCUMENTATION CODES:   Not applicable  INTERVENTION:   Tube feeding:  -Vital AF 1.2 @ 30 ml/hr via PEG -Increase 10 ml Q4 hours to goal rate of 50 ml/hr (1200 ml) -30 ml Prostat BID -Free water flushes 200 ml Q8 hours- per CCM  Provides: 1640 kcal (2113 kcals with propofol), 120 grams protein, 973 ml free water (1573 ml with flushes)  NUTRITION DIAGNOSIS:   Increased nutrient needs related to wound healing as evidenced by estimated needs.  Ongoing  GOAL:   Patient will meet greater than or equal to 90% of their needs  Addressed via TF  MONITOR:   PO intake, Supplement acceptance, Weight trends, Labs, I & O's, Diet advancement, Skin, TF tolerance  REASON FOR ASSESSMENT:   Consult Enteral/tube feeding initiation and management  ASSESSMENT:   Patient with PMH significant for bipolar disorder, DM, HTN, schizophrenia, mitral valve MSSA endocarditis, s/p trach with eventually decannulation, and s/p PEG. Presents this admission with septic shock due to presumed urosepsis.   Possible aspiration event yesterday evening. Now intubated. Started on propofol. Off levophed. Hypernatremia improved. CBGs improved. Change tube feeding to better meet needs. Titrate to goal.   Admission weight: 89.8 kg  Current weight: 89.1 kg   Patient is currently intubated on ventilator support MV: 13.1 L/min Temp (24hrs), Avg:98.2 F (36.8 C), Min:94.1 F (34.5 C), Max:100 F (37.8 C)  Propofol: 16.2 ml/hr- provides 473 kcal from lipids daily    I/O: +2,480 ml since admit  UOP: 565 ml x 24 hrs  Drips: fentanyl, propofol Medications: colace, miralax Labs: Na 148 (H)-trending down CBG 88-125  NUTRITION - FOCUSED PHYSICAL EXAM:    Most Recent Value  Orbital Region  No depletion  Upper Arm Region  Mild depletion  Thoracic and Lumbar Region  Unable to assess  Buccal Region  No depletion  Temple Region  Mild depletion  Clavicle Bone Region  Mild depletion   Clavicle and Acromion Bone Region  Mild depletion  Scapular Bone Region  Unable to assess  Dorsal Hand  No depletion  Patellar Region  Mild depletion  Anterior Thigh Region  Mild depletion  Posterior Calf Region  Mild depletion  Edema (RD Assessment)  Mild  Hair  Reviewed  Eyes  Unable to assess  Mouth  Unable to assess  Skin  Reviewed  Nails  Reviewed     Diet Order:   Diet Order            Diet NPO time specified  Diet effective now              EDUCATION NEEDS:   Not appropriate for education at this time  Skin:  Skin Assessment: Skin Integrity Issues: Skin Integrity Issues:: Stage I, Other (Comment) Stage I: R thigh Other: multiple dry/flakey skin sites  Last BM:  4/15  Height:   Ht Readings from Last 1 Encounters:  04/22/19 6\' 2"  (1.88 m)    Weight:   Wt Readings from Last 1 Encounters:  04/25/19 89.1 kg    BMI:  Body mass index is 25.22 kg/m.  Estimated Nutritional Needs:   Kcal:  2021 kcal  Protein:  115-130 grams  Fluid:  >/= 2 L/day   2022 RD, LDN Clinical Nutrition Pager listed in AMION

## 2019-04-25 NOTE — Progress Notes (Signed)
   NAME:  Marc [name], MRN:  [id number], DOB:  06-May-1955, LOS: 3 ADMISSION DATE:  04/22/2019, CONSULTATION DATE:  4/13 REFERRING MD:  Dr. Estell Harpin EDP Jeani Hawking, CHIEF COMPLAINT:  Septic shock   Brief History   64 year old male with recent prolonged admission with MSSA endocarditis during which he required long term intubation and trach. Discharged 4/8. Now admitted 4/13 for presumed septic shock.   Past Medical History   has a past medical history of Bipolar 1 disorder (HCC), Bipolar affective (HCC), Diabetes mellitus without complication (HCC), Hypertension, and Schizophrenic disorder (HCC).  Significant Hospital Events   1/12 >4/8 admission for MSSA endocarditis.  4/14: Admission for urosepsis  4/15: Intubated for acute hypoxic hypercarbic respiratory failure  Consults:  None  Procedures:  CVL 4/13 > ETT 4/15>  Significant Diagnostic Tests:  CXR neg AXR neg  Micro Data:  BC 4/13 > Urine 4/13 > Growing Enterobacter  Antimicrobials:  Cefepime 4/13 > Vancomycin 4/13 >4/15  Interim history/subjective:  Episode of hypoxia. ABG with respiratory acidosis. Intubated. Now sedated. On Propofol 35 mcg and Fentanyl 75 mcg. Still requiring Levo .   Objective   Blood pressure 109/72, pulse (!) 54, temperature 97.8 F (36.6 C), temperature source Oral, resp. rate (!) 22, height 6\' 2"  (1.88 m), weight 89.1 kg, SpO2 100 %.    Vent Mode: PRVC FiO2 (%):  [40 %-100 %] 40 % Set Rate:  [22 bmp-25 bmp] 22 bmp Vt Set:  [600 mL] 600 mL PEEP:  [5 cmH20] 5 cmH20 Plateau Pressure:  [21 cmH20-27 cmH20] 22 cmH20   Intake/Output Summary (Last 24 hours) at 04/25/2019 0622 Last data filed at 04/25/2019 0600 Gross per 24 hour  Intake 1283.19 ml  Output 565 ml  Net 718.19 ml   Filed Weights   04/22/19 1526 04/24/19 0500 04/25/19 0441  Weight: 84 kg 89.8 kg 89.1 kg   Examination: General: Well nourished male, sedated HENT: Normocephalic, atraumatic, ETT in place Pulm: Good air  movement with bibasilar crackles CV: RRR, no murmurs, no rubs  Abdomen: Active bowel sounds, soft, non-distended, no tenderness to palpation  Extremities: Pulses palpable in all extremities, no LE edema  Skin: Warm and dry  Neuro: Sedated, RASS -4  Resolved Hospital Problem list     Assessment & Plan:   Septic shock Urinary Tract Infection  -- Urine cultures growing enterobacter. Sensitivities pending. Continue Cefepime  -- Blood cultures with NGTD -- Currently on 3 mcg/hr of levophed. Wean to maintain MAP > 65  Acute hypoxic hypercarbic respiratory failure  -- on 4/15 patient began to desat. ABG with hypoxic hypercarbic respiratory failure. Subsequently intubated.  -- ABG 7.38/50/541 this AM -- Sedated with propofol 35 mcg and Fentanyl 75 mcg  -- Wean sedation. Bilateral wrist restraints ordered.    Recent history of MSSA endocarditis: completed ABX in March.  -- Echo without evidence of vegetations  -- BC with NGTD  DM, hypoglycemic -- CBG monitoring and SSI -- Start TF  Bipolar disorder Schizophrenia  -- Continue home seroquel and haldol  Best practice:  Diet: TF Pain/Anxiety/Delirium protocol (if indicated): Fentanyl and propofol VAP protocol (if indicated): Per protocol DVT prophylaxis: heparin GI prophylaxis: NA Glucose control: SSI Mobility: BR Code Status: FULL Family Communication: Will call wife Disposition: ICU  Please see attending attestation for final recommendations.   April, MD  [facility] PGY3  Pager: [phone]

## 2019-04-26 ENCOUNTER — Inpatient Hospital Stay (HOSPITAL_COMMUNITY): Payer: Medicare Other

## 2019-04-26 LAB — CBC
HCT: 29 % — ABNORMAL LOW (ref 39.0–52.0)
Hemoglobin: 8.7 g/dL — ABNORMAL LOW (ref 13.0–17.0)
MCH: 25.2 pg — ABNORMAL LOW (ref 26.0–34.0)
MCHC: 30 g/dL (ref 30.0–36.0)
MCV: 84.1 fL (ref 80.0–100.0)
Platelets: 150 10*3/uL (ref 150–400)
RBC: 3.45 MIL/uL — ABNORMAL LOW (ref 4.22–5.81)
RDW: 19.5 % — ABNORMAL HIGH (ref 11.5–15.5)
WBC: 5.3 10*3/uL (ref 4.0–10.5)
nRBC: 0.4 % — ABNORMAL HIGH (ref 0.0–0.2)

## 2019-04-26 LAB — GLUCOSE, CAPILLARY
Glucose-Capillary: 54 mg/dL — ABNORMAL LOW (ref 70–99)
Glucose-Capillary: 172 mg/dL — ABNORMAL HIGH (ref 70–99)
Glucose-Capillary: 100 mg/dL — ABNORMAL HIGH (ref 70–99)
Glucose-Capillary: 82 mg/dL (ref 70–99)
Glucose-Capillary: 85 mg/dL (ref 70–99)
Glucose-Capillary: 98 mg/dL (ref 70–99)

## 2019-04-26 LAB — BASIC METABOLIC PANEL
Anion gap: 6 (ref 5–15)
BUN: 37 mg/dL — ABNORMAL HIGH (ref 8–23)
CO2: 25 mmol/L (ref 22–32)
Calcium: 8.3 mg/dL — ABNORMAL LOW (ref 8.9–10.3)
Chloride: 117 mmol/L — ABNORMAL HIGH (ref 98–111)
Creatinine, Ser: 1.14 mg/dL (ref 0.61–1.24)
GFR calc Af Amer: >60 mL/min (ref >60)
GFR calc non Af Amer: >60 mL/min (ref >60)
Glucose, Bld: 183 mg/dL — ABNORMAL HIGH (ref 70–99)
Potassium: 3.9 mmol/L (ref 3.5–5.1)
Sodium: 148 mmol/L — ABNORMAL HIGH (ref 135–145)

## 2019-04-26 LAB — TRIGLYCERIDES: Triglycerides: 63 mg/dL (ref <150)

## 2019-04-26 MED ORDER — FREE WATER
300.0000 mL | Status: DC
  Administered 2019-04-26 – 2019-05-02 (×34): 300 mL

## 2019-04-26 MED ORDER — MIDAZOLAM HCL 2 MG/2ML IJ SOLN
2.0000 mg | INTRAMUSCULAR | Status: AC | PRN
  Administered 2019-04-26 – 2019-04-27 (×2): 2 mg via INTRAVENOUS
  Filled 2019-04-26 (×2): qty 2

## 2019-04-26 MED ORDER — DEXTROSE 50 % IV SOLN
25.0000 g | INTRAVENOUS | Status: AC
  Administered 2019-04-26: 25 g via INTRAVENOUS
  Filled 2019-04-26: qty 50

## 2019-04-26 NOTE — Progress Notes (Addendum)
NAME:  Marc [name], MRN:  [id number], DOB:  05-29-1955, LOS: 4 ADMISSION DATE:  04/22/2019, CONSULTATION DATE:  4/13 REFERRING MD:  Dr. Roderic Palau EDP Forestine Na, CHIEF COMPLAINT:  Septic shock   Brief History   64 year old male with recent prolonged admission with MSSA endocarditis during which he required long term intubation and trach. Discharged 4/8. Now admitted 4/13 for presumed septic shock.   Past Medical History   has a past medical history of Bipolar 1 disorder ([place]), Bipolar affective ([place]), Diabetes mellitus without complication ([place]), Hypertension, and Schizophrenic disorder ([place]).  Significant Hospital Events   1/12 >4/8 admission for MSSA endocarditis.  4/14: Admission for urosepsis  4/15: Intubated for acute hypoxic hypercarbic respiratory failure  Consults:  None  Procedures:  CVL 4/13 > ETT 4/15>  Significant Diagnostic Tests:  CXR neg AXR neg  Micro Data:  BC 4/13 > NGTD Urine 4/13 > Growing Enterobacter  Antimicrobials:  Cefepime 4/13 > Vancomycin 4/13 >4/15  Interim history/subjective:  Weaning sedation. Uneventful night.   Objective   Blood pressure 112/65, pulse 69, temperature 98.4 F (36.9 C), resp. rate (!) 22, height 6\' 2"  (1.88 m), weight 90.7 kg, SpO2 100 %.    Vent Mode: PRVC FiO2 (%):  [40 %] 40 % Set Rate:  [22 bmp] 22 bmp Vt Set:  [600 mL] 600 mL PEEP:  [5 cmH20] 5 cmH20 Plateau Pressure:  [18 cmH20-22 cmH20] 19 cmH20   Intake/Output Summary (Last 24 hours) at 04/26/2019 0553 Last data filed at 04/26/2019 0500 Gross per 24 hour  Intake 1631.95 ml  Output 400 ml  Net 1231.95 ml   Filed Weights   04/24/19 0500 04/25/19 0441 04/26/19 0500  Weight: 89.8 kg 89.1 kg 90.7 kg   Examination: General: Well nourished male, sedated HENT: Normocephalic, atraumatic, ETT in place Pulm: Good air movement with bibasilar crackles CV: RRR, no murmurs, no rubs  Abdomen: Active bowel sounds, soft, non-distended, no tenderness to palpation    Extremities: Pulses palpable in all extremities, no LE edema  Skin: Warm and dry  Neuro: Sedated  Resolved Hospital Problem list     Assessment & Plan:   Septic shock Urinary Tract Infection  -- Urine cultures growing enterobacter. Continue Cefepime day 3 of 7 -- Blood cultures with NGTD -- Currently on 2 mcg/hr of levophed. Wean to maintain MAP > 65  Acute hypoxic hypercarbic respiratory failure  Aspiration PNA -- Continue Cefepime Day 3 of 7  -- Sedated with propofol 15 mcg and Fentanyl 5 mcg  -- Wean sedation then SBT trial. Bilateral wrist restraints if needed.    Recent history of MSSA endocarditis: completed ABX in March.  -- Echo without evidence of vegetations  -- BC with NGTD  DM, hypoglycemic -- CBG monitoring and SSI -- TF  Bipolar disorder Schizophrenia  -- Continue home seroquel and haldol  Best practice:  Diet: TF Pain/Anxiety/Delirium protocol (if indicated): Fentanyl and propofol VAP protocol (if indicated): Per protocol DVT prophylaxis: heparin GI prophylaxis: NA Glucose control: SSI Mobility: BR Code Status: FULL Family Communication: Will call family Disposition: ICU  Please see attending attestation for final recommendations.   Ina Homes, MD  [facility] PGY3  Pager: [phone]  PCCM attending:  This is a 64 year old gentleman recent prolonged admission with MSSA endocarditis, required a long-term intubation and tracheostomy.  Patient was discharged on 04/17/2019.  Readmitted with sepsis and new right lower lobe pneumonia concerning for aspiration.  Possible urinary tract infection with cultures growing Enterobacter.  Difficulty with IV access.  Ultrasound-guided peripheral placed this morning.  Triple-lumen in the groin not flushing appropriately.  Plans for new vascular access today.  BP 105/65   Pulse (!) 58   Temp 98.2 F (36.8 C) (Oral)   Resp (!) 22   Ht 6\' 2"  (1.88 m)   Wt 90.7 kg   SpO2 100%   BMI 25.67 kg/m   General: Elderly  male intubated on mechanical life support HEENT: Endotracheal tube in place. Heart: Regular rate rhythm S1 S2 Lungs: Bilateral mechanically ventilated breath sounds  Labs: Reviewed Chest x-ray: Reviewed with right lower lobe pneumonia, repeat pending for post line placement.  Assessment: Septic shock concern for urinary tract infection, Enterobacter UTI Acute hypoxemic respiratory failure secondary to right lower lobe aspiration pneumonia History of MSSA endocarditis Type 2 diabetes Bipolar schizophrenia Intubated mechanical life support  Plan: Cefepime continue day 3 of 7 Continue to wean norepinephrine to maintain mean arterial pressure greater than 65 mmHg Decrease sedation goal RASS 0 to -1 Attempt SBT once stable. Reviewed chest x-ray with persistent infiltrate.  Repeat as needed.  This patient is critically ill with multiple organ system failure; which, requires frequent high complexity decision making, assessment, support, evaluation, and titration of therapies. This was completed through the application of advanced monitoring technologies and extensive interpretation of multiple databases. During this encounter critical care time was devoted to patient care services described in this note for 32 minutes.  , DO [place] [facility] 04/26/2019 11:09 AM

## 2019-04-26 NOTE — Progress Notes (Signed)
Spoke with the patient's son, Tamaj Jurgens. We dicussed the patient's clinical course and treatment plan. Also discussed the need to place a CVC. After discussion of the risks/benefit [name] Ponder agrees and consents to CVC placement for the patient. Consent in chart.   Levora Dredge, MD  [facility] PGY3  Pager: [phone]

## 2019-04-26 NOTE — Progress Notes (Signed)
PCCM Interval progress note:  Paged to assess L femoral CVC, 2/3 lumens clotted overnight after blood drawn.  IV team attempted to instill cathflo, but unable to instill.  I was not able to flush at all, attempted to retract line 1.5cm and flush with saline, however this was also unsuccessfull.  Daytime team to attempt to re-wire.    Darcella Gasman Devean Skoczylas, PA-C

## 2019-04-26 NOTE — Progress Notes (Signed)
Patient's Right IJ CVC appeared to be unsecured. Dressing removed, replaced and Dr. Tonia Brooms notified. Repeat chest xray done and line placement confirmed. Dr. Tonia Brooms evaluated line and confirmed it is usable.

## 2019-04-26 NOTE — Progress Notes (Signed)
eLink Physician-Brief Progress Note Patient Name: Marc [name] DOB: 03/06/1955 MRN: [id number]   Date of Service  04/26/2019  HPI/Events of Note  Agitation - Nursing request for bilateral soft wrist restraints.   eICU Interventions  Will order bilateral soft wrist restraint X 12 hours.      Intervention Category Major Interventions: Delirium, psychosis, severe agitation - evaluation and management  [name] 04/26/2019, 7:46 PM

## 2019-04-26 NOTE — Progress Notes (Signed)
L Femorla CVC assessed due to no blood return. One lumen currently infusing with no issues. 2 remaining lumens fully occluded and unable to flush; TPA dosing no appropriate due to this. RN states will discuss with MD.

## 2019-04-26 NOTE — Procedures (Signed)
Central Venous Catheter Insertion Procedure Note Marc [name] [id number] 1955-08-26  Procedure: Insertion of Central Venous Catheter Indications: Drug and/or fluid administration  Procedure Details Consent: Risks of procedure as well as the alternatives and risks of each were explained to the (patient/caregiver).  Consent for procedure obtained. Time Out: Verified patient identification, verified procedure, site/side was marked, verified correct patient position, special equipment/implants available, medications/allergies/relevent history reviewed, required imaging and test results available.  Performed  Maximum sterile technique was used including antiseptics, cap, gloves, gown, hand hygiene, mask and sheet. Skin prep: Chlorhexidine; local anesthetic administered A antimicrobial bonded/coated triple lumen catheter was placed in the right internal jugular vein using the Seldinger technique. Catheter placed to 20 cm. (placed at 16 cm) Blood aspirated via all 3 ports and then flushed x 3. Line sutured x 2 and dressing applied.  Ultrasound guidance used.Yes.    Media Information   Document Information  Photos  RIJ CVC  04/26/2019 09:07  Attached To:  Hospital Encounter on 04/22/19  Source Information  Levora Dredge, MD  [facility]   Evaluation Blood flow good Complications: No apparent complications Patient did tolerate procedure well. Chest X-ray ordered to verify placement.  CXR: pending.  Dellia Cloud, D.O. [facility] Internal Medicine, PGY-1 Pager: [phone], Phone: [phone] Date 04/26/2019 Time 9:32 AM

## 2019-04-26 NOTE — Progress Notes (Addendum)
Right triple lumen IJ placed bedside by resident.

## 2019-04-26 NOTE — Progress Notes (Signed)
Patient's HR went down to 38-40, Dr. Tonia Brooms paged. Given verbal order to turn off propofol and put in order for versed 2 mg Q 2. Fentanyl to continue.

## 2019-04-26 NOTE — Progress Notes (Addendum)
Upon arrival of my shift, patient's HR was in the 40s. BP and MAP adequate. MD ordered to stop propofol due to low HR (30s) before my shift. Patient continued to be agitated and reached for lines (ETT, CVC, external catheter) on 130 mcg/hr fentanyl and 2 mg of versed. Bilateral soft wrist restraints were ordered.   RN unable to get oral or axillary temperature so an esophageal probe was placed. Esophageal temp read 34.4 C. To verify, rectal temp was obtained - read 34.9 C. Bairhugger was placed on max settings.  2330: Temperature is slowly increasing. NSR currently in the 60s.   2247: 12 beat run of Vtach. Will continue to monitor for more ectopy.

## 2019-04-27 LAB — CBC
HCT: 29.6 % — ABNORMAL LOW (ref 39.0–52.0)
Hemoglobin: 9 g/dL — ABNORMAL LOW (ref 13.0–17.0)
MCH: 25.6 pg — ABNORMAL LOW (ref 26.0–34.0)
MCHC: 30.4 g/dL (ref 30.0–36.0)
MCV: 84.3 fL (ref 80.0–100.0)
Platelets: 143 10*3/uL — ABNORMAL LOW (ref 150–400)
RBC: 3.51 MIL/uL — ABNORMAL LOW (ref 4.22–5.81)
RDW: 19.5 % — ABNORMAL HIGH (ref 11.5–15.5)
WBC: 4.8 10*3/uL (ref 4.0–10.5)
nRBC: 0 % (ref 0.0–0.2)

## 2019-04-27 LAB — GLUCOSE, CAPILLARY
Glucose-Capillary: 70 mg/dL (ref 70–99)
Glucose-Capillary: 77 mg/dL (ref 70–99)
Glucose-Capillary: 78 mg/dL (ref 70–99)
Glucose-Capillary: 82 mg/dL (ref 70–99)
Glucose-Capillary: 87 mg/dL (ref 70–99)
Glucose-Capillary: 89 mg/dL (ref 70–99)
Glucose-Capillary: 91 mg/dL (ref 70–99)

## 2019-04-27 LAB — BASIC METABOLIC PANEL
Anion gap: 6 (ref 5–15)
BUN: 35 mg/dL — ABNORMAL HIGH (ref 8–23)
CO2: 23 mmol/L (ref 22–32)
Calcium: 8.3 mg/dL — ABNORMAL LOW (ref 8.9–10.3)
Chloride: 117 mmol/L — ABNORMAL HIGH (ref 98–111)
Creatinine, Ser: 0.8 mg/dL (ref 0.61–1.24)
GFR calc Af Amer: >60 mL/min (ref >60)
GFR calc non Af Amer: >60 mL/min (ref >60)
Glucose, Bld: 79 mg/dL (ref 70–99)
Potassium: 3.9 mmol/L (ref 3.5–5.1)
Sodium: 146 mmol/L — ABNORMAL HIGH (ref 135–145)

## 2019-04-27 LAB — CULTURE, BLOOD (ROUTINE X 2)
Culture: NO GROWTH
Culture: NO GROWTH
Special Requests: ADEQUATE
Special Requests: ADEQUATE

## 2019-04-27 LAB — TRIGLYCERIDES: Triglycerides: 67 mg/dL (ref <150)

## 2019-04-27 MED ORDER — HYDROXYZINE HCL 10 MG PO TABS
10.0000 mg | ORAL_TABLET | Freq: Three times a day (TID) | ORAL | Status: DC | PRN
  Administered 2019-04-27 – 2019-04-29 (×6): 10 mg via ORAL
  Filled 2019-04-27 (×7): qty 1

## 2019-04-27 MED ORDER — FUROSEMIDE 10 MG/ML IJ SOLN
40.0000 mg | Freq: Once | INTRAMUSCULAR | Status: AC
  Administered 2019-04-27: 40 mg via INTRAVENOUS
  Filled 2019-04-27: qty 4

## 2019-04-27 NOTE — Progress Notes (Signed)
Consult placed for IV start. Pt currently has a working piv that is saline locked and is sufficient for IV med needs. Pt also removed CVC earlier today. Saline lock is the only order at this time for IV access. IV Team will remain available as needed. Spoke with Maureen Ralphs, RN.  Gilman Schmidt, RN VAST

## 2019-04-27 NOTE — Progress Notes (Addendum)
PCCM Interval Progress Note  Following up on PSV/CPAP wean Tolerating 5/5  Good volumes, RR 11-14/min  Following commands and more alert than exam earlier this morning   P -extubate -NPO -O2 as needed for goal SpO2 > 92%.  -IS, t/c/db  -Elevate HOB, Bed in chair position, progress to up to chair & PT/OT -1x 40mg  lasix    MSN, AGACNP-BC [facility]monary/Critical Care Medicine [facility] If no answer, [phone] 04/27/2019, 11:07 AM

## 2019-04-27 NOTE — Progress Notes (Signed)
Patient is on NIV at this time tolerating it fairly well.  

## 2019-04-27 NOTE — Progress Notes (Signed)
235cc Fentanyl from IV infusion bag wasted in biohazard container with 2nd RN, Prince Rome, as witness.

## 2019-04-27 NOTE — Procedures (Signed)
Extubation Procedure Note  Patient Details:   Name: TRUTH BAROT DOB: [birth year] MRN: [id number]   Airway Documentation:    Vent end date: 04/27/19 Vent end time: 1115   Evaluation  O2 sats: stable throughout Complications: No apparent complications Patient did tolerate procedure well. Bilateral Breath Sounds: Diminished   Yes  4l/min [place] Incentive spirometer at the bedside  Newt Lukes 04/27/2019, 11:15 AM

## 2019-04-27 NOTE — Progress Notes (Signed)
Pt accidentally pulled out his R IJ CVC. Hemostasis quickly achieved, occlusive drsg applied.

## 2019-04-27 NOTE — Progress Notes (Addendum)
Patient was alert and talkative throughout the shift. Seroquel and Haldol was given at 2124. Around 2315, RT needed patient to pull more volume for BIPAP (previously on nasal cannula) but patient was slow to arouse. Patient did not open eyes to voice. Opened eyes to sternal rub and followed commands after aroused but quickly went back to slumber. Checked CBG - 87 mg/dL.  Unable to get oral or axillary temperature. Rectal temperature read 94.4 F despite having the bairhugger on at max. HR 100s NSR, BP 105/67 (77)  Elink was made aware of patient's current status.   0030 Update: Patient is more alert and arousable to voice.

## 2019-04-27 NOTE — Progress Notes (Addendum)
NAME:  Marc [name], MRN:  [id number], DOB:  Feb 10, 1955, LOS: 5 ADMISSION DATE:  04/22/2019, CONSULTATION DATE:  4/13 REFERRING MD:  Dr. Estell Harpin EDP Jeani Hawking, CHIEF COMPLAINT:  Septic shock   Brief History   64 year old male with recent prolonged admission with MSSA endocarditis during which he required long term intubation and trach. Discharged 4/8. Now admitted 4/13 for presumed septic shock.   Past Medical History   has a past medical history of Bipolar 1 disorder (HCC), Bipolar affective (HCC), Diabetes mellitus without complication (HCC), Hypertension, and Schizophrenic disorder (HCC).  Significant Hospital Events   1/12 >4/8 admission for MSSA endocarditis.  4/14: Admission for urosepsis  4/15: Intubated for acute hypoxic hypercarbic respiratory failure 4/19: weaning trials on vent   Consults:  None  Procedures:  CVL 4/13 >3/18 ETT 4/15> CVC 4/18>  Significant Diagnostic Tests:  CXR neg AXR neg  Micro Data:  [facility] 4/13 > NGTD Urine 4/13 > Growing Enterobacter  Antimicrobials:  Cefepime 4/13 > Vancomycin 4/13 >4/15  Interim history/subjective:  Propofol dc due to bradycardia Weaning fentanyl gtt this morning and weaning vent, currently with decreased RR on PSV/CPAP but large tidals.  Off pressors  Objective   Blood pressure 95/62, pulse 83, temperature 98.8 F (37.1 C), resp. rate (!) 9, height 6\' 2"  (1.88 m), weight 88.1 kg, SpO2 99 %.    Vent Mode: PRVC FiO2 (%):  [30 %-40 %] 30 % Set Rate:  [22 bmp] 22 bmp Vt Set:  [600 mL] 600 mL PEEP:  [5 cmH20] 5 cmH20 Plateau Pressure:  [16 cmH20-21 cmH20] 16 cmH20   Intake/Output Summary (Last 24 hours) at 04/27/2019 0804 Last data filed at 04/27/2019 0600 Gross per 24 hour  Intake 1315.42 ml  Output 550 ml  Net 765.42 ml   Filed Weights   04/25/19 0441 04/26/19 0500 04/27/19 0500  Weight: 89.1 kg 90.7 kg 88.1 kg   Examination: General: Chronically ill appearing older adult male, intubated and lightly  sedated NAD  HENT:  NCAT. ETT and esophageal temp probe secure. Anicteric sclera. Trachea midline.  Pulm: symmetrical chest expansion no accessory muscle use on PSV/CPAP CV: RRR s1s2 no rgm. Cap refill < 3 seconds 1+ radial pulses bialterally  Abdomen: Soft round ndnt, normoactive x4 Extremities: non-pitting pedal edema bilaterally. No cyanosis or clubbing.  Skin: flaky skin across chest, BLE. Clean, dry and warm  Neuro: RASS 0. Following commands. PERRL, sluggish   Resolved Hospital Problem list     Assessment & Plan:   Septic Shock, improving  Enterobacter urinary tract infection  P - Continue Cefepime day 4 of 7 for UTI - MAP goal > 65, currently off pressors   Acute respiratory failure with hypoxemia and hypercarbia requiring intubation RLL Aspiration PNA -weaning PSV/CPAP 5/5 on 4/18, RR intermittently low, likely due to sedation clearing  P -Continue Cefepime for 7 day course  -WUA/SBT, hope to extubate 4/18  -If to remain intubated, can resume low dose fentanyl. Can also consider adding back home trazodone qHS -Pulm hygiene -PRN CXR   Hx MSSA endocarditis  -abx completed March 2021  - ECHO w/o evidence of vegetations - BCx NGTD  P -will continue to eval necessity of CVC qD and dc when able   Hypoglcemia P -EN  -Hypoglycemia protocol PRN   Bipolar disorder Schizophrenia  P -Continue home antipsychotics -currently holding home trazodone   Hypernatremia, mild P -Continue FWF   Best practice:  Diet: TF Pain/Anxiety/Delirium protocol (if indicated): Fentanyl  VAP protocol (if indicated): Per protocol DVT prophylaxis: heparin GI prophylaxis: Pepcid  Glucose control: monitor Mobility: BR Code Status: FULL Family Communication: Pending 4/18 Disposition: ICU   CRITICAL CARE Performed by: Cristal Generous   Total critical care time: 38 minutes  Critical care time was exclusive of separately billable procedures and treating other patients. Critical  care was necessary to treat or prevent imminent or life-threatening deterioration.  Critical care was time spent personally by me on the following activities: development of treatment plan with patient and/or surrogate as well as nursing, discussions with consultants, evaluation of patient's response to treatment, examination of patient, obtaining history from patient or surrogate, ordering and performing treatments and interventions, ordering and review of laboratory studies, ordering and review of radiographic studies, pulse oximetry and re-evaluation of patient's condition.  Eliseo Gum MSN, AGACNP-BC [place] [phone] If no answer, [phone] 04/27/2019, 8:05 AM     PCCM attending:  This is a 64 year old gentleman recent prolonged admission for MSSA endocarditis, required long-term intubation and tracheostomy.  Was discharged on 8 April.  Only to be readmitted this past week for sepsis and a new right lower lobe pneumonia concerning for aspiration as well as a possible urinary tract infection secondary to Enterobacter.  Patient has done well on mechanical support.  Prior tracheostomy site well-healed and closed.  Still has a PEG tube in place.  No issues overnight on mechanical support except for bradycardia in which propofol was stopped.  BP (!) 105/53   Pulse (!) 112   Temp 98.8 F (37.1 C)   Resp 14   Ht 6\' 2"  (1.88 m)   Wt 88.1 kg   SpO2 95%   BMI 24.94 kg/m   General: Elderly chronically ill-appearing male intubated on mechanical life support HEENT: Endotracheal tube in place Heart: Regular rhythm S1-S2 Lungs: Biomechanical ventilated breath sounds Skin: Diffuse scaling rash  Labs: Reviewed Chest x-ray: Reviewed line in place appropriately.  Assessment: Septic shock secondary to urinary tract infection, Enterobacter UTI, aspiration pneumonia, shock has now resolved.  Off vasopressors Acute hypoxemic respiratory failure requiring  intubation mechanical ventilation secondary to above History of MSSA endocarditis Type 2 diabetes Intubated mechanical life support.  Plan: Continue cefepime Stop continuous sedation Attempt SBT Possibly be able to liberate from mechanical support today.  This patient is critically ill with multiple organ system failure; which, requires frequent high complexity decision making, assessment, support, evaluation, and titration of therapies. This was completed through the application of advanced monitoring technologies and extensive interpretation of multiple databases. During this encounter critical care time was devoted to patient care services described in this note for 31 minutes.   Garner Nash, DO [place] [facility] 04/27/2019 10:56 AM

## 2019-04-27 NOTE — Progress Notes (Signed)
PCCM Brief Family Communication Note  Updated patient's spouse on clinical updates, all questions answered.   Tessie Fass MSN, AGACNP-BC [place] [facility] [phone] If no answer, [phone] 04/27/2019, 12:52 PM

## 2019-04-28 ENCOUNTER — Inpatient Hospital Stay (HOSPITAL_COMMUNITY): Payer: Medicare Other

## 2019-04-28 LAB — GLUCOSE, CAPILLARY
Glucose-Capillary: 100 mg/dL — ABNORMAL HIGH (ref 70–99)
Glucose-Capillary: 110 mg/dL — ABNORMAL HIGH (ref 70–99)
Glucose-Capillary: 74 mg/dL (ref 70–99)
Glucose-Capillary: 75 mg/dL (ref 70–99)
Glucose-Capillary: 87 mg/dL (ref 70–99)
Glucose-Capillary: 91 mg/dL (ref 70–99)

## 2019-04-28 LAB — BASIC METABOLIC PANEL
Anion gap: 8 (ref 5–15)
BUN: 28 mg/dL — ABNORMAL HIGH (ref 8–23)
CO2: 26 mmol/L (ref 22–32)
Calcium: 8.4 mg/dL — ABNORMAL LOW (ref 8.9–10.3)
Chloride: 113 mmol/L — ABNORMAL HIGH (ref 98–111)
Creatinine, Ser: 0.73 mg/dL (ref 0.61–1.24)
GFR calc Af Amer: >60 mL/min (ref >60)
GFR calc non Af Amer: >60 mL/min (ref >60)
Glucose, Bld: 86 mg/dL (ref 70–99)
Potassium: 4.4 mmol/L (ref 3.5–5.1)
Sodium: 147 mmol/L — ABNORMAL HIGH (ref 135–145)

## 2019-04-28 LAB — CBC
HCT: 34.3 % — ABNORMAL LOW (ref 39.0–52.0)
Hemoglobin: 10 g/dL — ABNORMAL LOW (ref 13.0–17.0)
MCH: 25.4 pg — ABNORMAL LOW (ref 26.0–34.0)
MCHC: 29.2 g/dL — ABNORMAL LOW (ref 30.0–36.0)
MCV: 87.1 fL (ref 80.0–100.0)
Platelets: 135 10*3/uL — ABNORMAL LOW (ref 150–400)
RBC: 3.94 MIL/uL — ABNORMAL LOW (ref 4.22–5.81)
RDW: 19 % — ABNORMAL HIGH (ref 11.5–15.5)
WBC: 4.5 10*3/uL (ref 4.0–10.5)
nRBC: 0.4 % — ABNORMAL HIGH (ref 0.0–0.2)

## 2019-04-28 MED ORDER — RESOURCE THICKENUP CLEAR PO POWD
ORAL | Status: DC | PRN
  Filled 2019-04-28 (×2): qty 125

## 2019-04-28 NOTE — TOC Initial Note (Signed)
Transition of Care [facility]) - Initial/Assessment Note    Patient Details  Name: Marc [name] MRN: [id number] Date of Birth: Sep 24, 1955  [facility] of [facility]) CM/SW Contact:    Lockie Pares, RN Phone Number: 04/28/2019, 4:29 PM  Clinical Narrative:                 64 Yo Male patent admitted for urosepsis.  Intubated for 3 days due to respiratory Failure, hypercarbic.  Assessing now for discharging planning and placement for rehabilitation.  Paitent has just had a swallow study in which he had difficulty and is requiring dysphagia diet and full supervision.  In speaking with Marc [name]. He would like to go home, but he understands he will likely need a bridge with rehab/ skilled care. He is amiable to this, but does not want to be in the previous facility he was in.   Left a message with son Marc [name] [phone].  Expected Discharge Plan: Skilled Nursing Facility Barriers to Discharge: Continued Medical Work up   Patient Goals and CMS Choice Patient states their goals for this hospitalization and ongoing recovery are:: Get better and rehabilitate      Expected Discharge Plan and Services Expected Discharge Plan: Skilled Nursing Facility In-house Referral: Clinical Social Work   Post Acute Care Choice: Skilled Nursing Facility Living arrangements for the past 2 months: Single Family Home                                      Prior Living Arrangements/Services Living arrangements for the past 2 months: Single Family Home Lives with:: Self Patient language and need for interpreter reviewed:: Yes        Need for Family Participation in Patient Care: Yes (Comment) Care giver support system in place?: Yes (comment)   Criminal Activity/Legal Involvement Pertinent to Current Situation/Hospitalization: No - Comment as needed  Activities of Daily Living      Permission Sought/Granted Permission sought to share information with : Case Manager, Optician, dispensing Permission granted to share information with : Yes, Verbal Permission Granted  Share Information with NAME: Forensic psychologist  Permission granted to share info w AGENCY: SNF  Permission granted to share info w Relationship: Son Marc [name] [phone]  Permission granted to share info w Contact Information: Marc [name] [phone]  Emotional Assessment Appearance:: Appears older than stated age Attitude/Demeanor/Rapport: Gracious Affect (typically observed): Stable, Pleasant Orientation: : Oriented to Self, Oriented to Place, Oriented to  Time, Oriented to Situation Alcohol / Substance Use: Tobacco Use Psych Involvement: No (comment)  Admission diagnosis:  Sepsis secondary to UTI (HCC) [A41.9, N39.0] Hypotension [I95.9] Hypothermia, initial encounter [T68.XXXA] Septic shock (HCC) [A41.9, R65.21] Hypotension, unspecified hypotension type [I95.9] Patient Active Problem List   Diagnosis Date Noted  . Airway intubation performed without difficulty   . Sepsis secondary to UTI (HCC) 04/23/2019  . Hypotension 04/22/2019  . Hernia of abdominal wall 04/01/2019  . Endocarditis 03/19/2019  . Altered mental status   . Pressure injury of skin 01/29/2019  . Staphylococcus aureus bacteremia 01/23/2019  . Bipolar 1 disorder (HCC) 01/23/2019  . Cigarette smoker 01/23/2019  . Dermatitis 01/23/2019  . Acute respiratory failure (HCC)   . Encephalopathy 01/21/2019  . Schizophrenia (HCC)   . EPIDURAL ABSCESS 01/01/2007  . DM 12/17/2006  . DYSPHAGIA UNSPECIFIED 12/17/2006  . HYPERTENSION NEC 12/17/2006   PCP:  Patient, [name] Pharmacy:  [id number] [id number] [id number]: [phone]: [phone]    Social Determinants of Health (SDOH) Interventions    Readmission Risk Interventions No flowsheet data found.
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
## 2019-04-28 NOTE — Progress Notes (Signed)
Modified Barium Swallow Progress Note  Patient Details  Name: Marc [name] MRN: [id number] Date of Birth: Aug 15, 1955  Today's Date: 04/28/2019  Modified Barium Swallow completed.  Full report located under Chart Review in the Imaging Section.  Brief recommendations include the following:  Clinical Impression  Pt demonstrates similar result to prior MBS with ongoing slow oral transit with pumping and eventual passive spill to deep pharynx. Swallow response is delayed and at times absent after pt initiates oral transit of lingual residue, but does not always follow up with a swallow. In contrast with prior MBS, pt silently aspirated both thin liquids and nectar thick liquids if bolus size was greater than a teaspoon. Trials of puree and honey thick liquids, even with larger bolus size resulted in spillage, but no aspiration. Function may be impacted by recent intubation. Recommend pt be allowed teaspoon bites of puree and honey thick liquids for pleasure, though the majority of nutrition should be given enterally until consistent safe intake can be established as pt has been known to have frequent setbacks. Will need conscientious assist with feeding and precautions. Will continue to follow for tolerance and advancement as able.    Swallow Evaluation Recommendations       SLP Diet Recommendations: Dysphagia 1 (Puree) solids;Honey thick liquids   Liquid Administration via: Spoon   Medication Administration: Crushed with puree   Supervision: Full assist for feeding;Staff to assist with self feeding;Full supervision/cueing for compensatory strategies   Compensations: Minimize environmental distractions;Slow rate;Small sips/bites;Clear throat after each swallow;Chin tuck;Multiple dry swallows after each bite/sip                Callum Wolf, Riley Nearing 04/28/2019,2:24 PM

## 2019-04-28 NOTE — NC FL2 (Signed)
[place] LEVEL OF CARE SCREENING TOOL     IDENTIFICATION  Patient Name: Marc [name] Birthdate: 1955-07-28 Sex: male Admission Date (Current Location): 04/22/2019  [facility] and [place] Number:  Herbalist and Address:  [facility] [place], [place] [address] [place],  [address]      Provider Number: [id number]  Attending Physician Name and Address:  Garner Nash, DO  Relative Name and Phone Number:  Lopez Dentinger [phone]    Current Level of Care: Hospital Recommended Level of Care: [place] Prior Approval Number:    Date Approved/Denied:   PASRR Number: [id number]  Discharge Plan: SNF    Current Diagnoses: Patient Active Problem List   Diagnosis Date Noted  . Airway intubation performed without difficulty   . Sepsis secondary to UTI ([place]) 04/23/2019  . Hypotension 04/22/2019  . Hernia of abdominal wall 04/01/2019  . Endocarditis 03/19/2019  . Altered mental status   . Pressure injury of skin 01/29/2019  . Staphylococcus aureus bacteremia 01/23/2019  . Bipolar 1 disorder ([place]) 01/23/2019  . Cigarette smoker 01/23/2019  . Dermatitis 01/23/2019  . Acute respiratory failure ([place])   . Encephalopathy 01/21/2019  . Schizophrenia ([facility])   . EPIDURAL ABSCESS 01/01/2007  . DM 12/17/2006  . DYSPHAGIA UNSPECIFIED 12/17/2006  . HYPERTENSION NEC 12/17/2006    Orientation RESPIRATION BLADDER Height & Weight     Self    External catheter, Incontinent Weight: 86.2 kg Height:  6\' 2"  (188 cm)  BEHAVIORAL SYMPTOMS/MOOD NEUROLOGICAL BOWEL NUTRITION STATUS      Incontinent Diet(see discharge summary)  AMBULATORY STATUS COMMUNICATION OF NEEDS Skin   Extensive Assist Verbally (appropriate for ethnicity, dry flaky, eczema, abdomen arm right leg.  exczma arm back chest, ear leg and abdomen circumfrencial bilateral ,  weeping leg ear shoulder , arm abdomen and anles bilaterally, pressure injury)                     Personal Care Assistance Level of Assistance  Bathing, Feeding, Dressing Bathing Assistance: Maximum assistance Feeding assistance: Maximum assistance(Full supervision when eating) Dressing Assistance: Maximum assistance     Functional Limitations Info  Hearing, Speech Sight Info: Adequate(drainage from eye) Hearing Info: Adequate Speech Info: Adequate(hoarse voice)    SPECIAL CARE FACTORS FREQUENCY        PT Frequency: 5 x weekly OT Frequency: 5 x weekly            Contractures Contractures Info: Not present    Additional Factors Info  Code Status Code Status Info: Full Allergies Info: Chlorahexidine           Current Medications (04/28/2019):  This is the current hospital active medication list Current Facility-Administered Medications  Medication Dose Route Frequency Provider Last Rate Last Admin  . 0.9 %  sodium chloride infusion  250 mL Intravenous Continuous Jacqlyn Larsen, [place]   Stopped at 04/26/19 1359  . ceFEPIme (MAXIPIME) 2 g in sodium chloride 0.9 % 100 mL IVPB  2 g Intravenous Q8H Acey Lav D, RPH 200 mL/hr at 04/28/19 1536 2 g at 04/28/19 1536  . docusate (COLACE) 50 MG/5ML liquid 100 mg  100 mg Oral BID [name], MD   100 mg at 04/28/19 0931  . famotidine (PEPCID) IVPB 20 mg premix  20 mg Intravenous Q12H [name], MD 100 mL/hr at 04/28/19 0936 20 mg at 04/28/19 0936  . feeding supplement (PRO-STAT SUGAR FREE 64) liquid 30 mL  30  mL Per Tube BID Lorin Glass, MD   30 mL at 04/28/19 0931  . feeding supplement (VITAL AF 1.2 CAL) liquid 1,000 mL  1,000 mL Per Tube Continuous Lorin Glass, MD 50 mL/hr at 04/28/19 1000 1,000 mL at 04/28/19 1000  . free water 300 mL  300 mL Per Tube Q4H Levora Dredge, MD   300 mL at 04/28/19 1649  . glycopyrrolate (ROBINUL) tablet 1 mg  1 mg Per Tube TID Lorin Glass, MD   1 mg at 04/28/19 1651  . haloperidol (HALDOL) tablet 1 mg  1 mg Per Tube BID Lorin Glass, MD   1 mg at 04/28/19  0931  . heparin injection 5,000 Units  5,000 Units Subcutaneous Q8H Duayne Cal, NP   5,000 Units at 04/28/19 1310  . hydrOXYzine (ATARAX/VISTARIL) tablet 10 mg  10 mg Oral TID PRN Lanier Clam, NP   10 mg at 04/28/19 0932  . midodrine (PROAMATINE) tablet 5 mg  5 mg Oral TID WC Lorin Glass, MD   5 mg at 04/28/19 1651  . polyethylene glycol (MIRALAX / GLYCOLAX) packet 17 g  17 g Oral Daily Rolan Lipa, MD   17 g at 04/28/19 0931  . QUEtiapine (SEROQUEL) tablet 25 mg  25 mg Per Tube QHS Lorin Glass, MD   25 mg at 04/27/19 2124  . Resource ThickenUp Clear   Oral PRN Lorin Glass, MD      . rocuronium [facility] [place] [facility]) injection 100 mg  100 mg Intravenous Once Stretch, Marveen Reeks, MD      . sodium chloride flush (NS) 0.9 % injection 10-40 mL  10-40 mL Intracatheter Q12H Lorin Glass, MD   10 mL at 04/27/19 0937  . sodium chloride flush (NS) 0.9 % injection 10-40 mL  10-40 mL Intracatheter PRN Lorin Glass, MD         Discharge Medications: Please see discharge summary for a list of discharge medications.  Relevant Imaging Results:  Relevant Lab Results:   Additional Information SSN [id number]  Lockie Pares, RN
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
## 2019-04-28 NOTE — Progress Notes (Signed)
Attempted to call the patient's son, Cominque Skoog. No answer. No voicemail left. Will try again later.   Levora Dredge, MD  [facility] PGY3  Pager: [phone]

## 2019-04-28 NOTE — Progress Notes (Signed)
Patient to be transferred out of the ICU. [facility] to assume care tomorrow at 7AM.   Levora Dredge, MD  [facility] PGY3  Pager: [phone]

## 2019-04-28 NOTE — Progress Notes (Addendum)
NAME:  Marc [name], MRN:  [id number], DOB:  Jun 23, 1955, LOS: 6 ADMISSION DATE:  04/22/2019, CONSULTATION DATE:  4/13 REFERRING MD:  Dr. Roderic Palau EDP Forestine Na, CHIEF COMPLAINT:  Septic shock   Brief History   64 year old male with recent prolonged admission with MSSA endocarditis during which he required long term intubation and trach. Discharged 4/8. Now admitted 4/13 for presumed septic shock.   Past Medical History   has a past medical history of Bipolar 1 disorder ([place]), Bipolar affective ([place]), Diabetes mellitus without complication ([place]), Hypertension, and Schizophrenic disorder ([place]).  Significant Hospital Events   1/12 >4/8 admission for MSSA endocarditis.  4/14: Admission for urosepsis  4/15: Intubated for acute hypoxic hypercarbic respiratory failure 4/18: Weaning trials on vent, extubated  Consults:  None  Procedures:  CVL 4/13 >3/18 ETT 4/15>4/18 CVC 4/17> 4/18  Significant Diagnostic Tests:  CXR neg AXR neg  Micro Data:  BC 4/13 > NGTD Urine 4/13 > Growing Enterobacter  Antimicrobials:  Cefepime 4/13 > Vancomycin 4/13 >4/15  Interim history/subjective:  Extubated on 4/18.  Pulled out his right IJ CVC. Tolerated BiPAP overnight   Objective   Blood pressure (!) 102/59, pulse 85, temperature (!) 97.5 F (36.4 C), resp. rate 12, height 6\' 2"  (1.88 m), weight 86.2 kg, SpO2 100 %.    Vent Mode: BIPAP;PCV FiO2 (%):  [30 %-40 %] 40 % Set Rate:  [12 bmp] 12 bmp PEEP:  [5 cmH20] 5 cmH20 Pressure Support:  [5 cmH20] 5 cmH20   Intake/Output Summary (Last 24 hours) at 04/28/2019 0751 Last data filed at 04/28/2019 0600 Gross per 24 hour  Intake 2539.86 ml  Output 1185 ml  Net 1354.86 ml   Filed Weights   04/26/19 0500 04/27/19 0500 04/28/19 0500  Weight: 90.7 kg 88.1 kg 86.2 kg   Examination:  General: Chronically ill appearing older adult male, on BiPAP HENT:  NCAT.  Pulm: symmetrical chest expansion no accessory muscle use on BiPAP CV: RRR s1s2  no rgm. Cap refill < 3 seconds 1+ radial pulses bialterally  Abdomen: Soft round ndnt, normoactive x4 Extremities: non-pitting pedal edema bilaterally. No cyanosis or clubbing.  Skin: flaky skin across chest, BLE. Clean, dry and warm  Neuro: Alert and oriented  Resolved Hospital Problem list     Assessment & Plan:   Septic Shock, Resolved  Enterobacter urinary tract infection  P -- Continue Cefepime day 7 of 7 for UTI  Acute respiratory failure with hypoxemia and hypercarbia requiring intubation RLL Aspiration PNA -- Extubated on 4/18  -- Tolerated BiPAP overnight  -- Aggressive pulm hygiene  -- Aspiration precautions   Hypoglcemia -EN  -Hypoglycemia protocol PRN   Bipolar disorder Schizophrenia  -- Continue home antipsychotics  Hypernatremia, mild -- Start D5W to replace free water  Hx MSSA endocarditis   Best practice:  Diet: TF Pain/Anxiety/Delirium protocol (if indicated): Fentanyl VAP protocol (if indicated): Per protocol DVT prophylaxis: heparin GI prophylaxis: Pepcid  Glucose control: monitor Mobility: BR Code Status: FULL Family Communication: Will call family Disposition: Transfer out of ICU   Ina Homes, MD  [facility] PGY3  Pager: [phone]   PCCM attending:  64 year old gentleman admitted for prior prolonged history of MSSA endocarditis discharge.  Reintubated brought back to the intensive care unit with septic shock from a UTI, aspiration pneumonia right lower lobe.  Patient was extubated yesterday.  Doing well.  Remains n.p.o.  PEG tube in place for feeding.  No issues overnight.  BP 95/64   Pulse  64   Temp (!) 97.5 F (36.4 C)   Resp 15   Ht 6\' 2"  (1.88 m)   Wt 86.2 kg   SpO2 95%   BMI 24.40 kg/m   General: Chronically debilitated elderly male, alert oriented Heart: Regular rhythm S1-S2 Lungs: Clear to auscultation no wheeze  Labs: Reviewed Chest x-ray: Reviewed  Assessment: Urinary tract infection, septic shock, resolved  continue cefepime complete 7 days Acute hypoxemic respiratory failure requiring mechanical ventilation now resolved, extubated History of bipolar disorder and schizophrenia History of MSSA endocarditis  Plan: Complete cefepime day 7 of 7 Extubated yesterday did well for the past 24 continue BiPAP as needed and at nighttime Continue pulmonary toileting Aspiration precautions Continue tube feeds N.p.o.  Patient stable for transfer from the intensive care unit.  , DO [facility] 04/28/2019 6:04 PM

## 2019-04-29 LAB — GLUCOSE, CAPILLARY
Glucose-Capillary: 84 mg/dL (ref 70–99)
Glucose-Capillary: 105 mg/dL — ABNORMAL HIGH (ref 70–99)
Glucose-Capillary: 78 mg/dL (ref 70–99)
Glucose-Capillary: 110 mg/dL — ABNORMAL HIGH (ref 70–99)

## 2019-04-29 MED ORDER — TRAZODONE HCL 50 MG PO TABS
50.0000 mg | ORAL_TABLET | Freq: Every day | ORAL | Status: DC
  Administered 2019-04-29 – 2019-05-01 (×3): 50 mg
  Filled 2019-04-29 (×3): qty 1

## 2019-04-29 MED ORDER — JEVITY 1.5 CAL/FIBER PO LIQD
910.0000 mL | ORAL | Status: DC
  Administered 2019-04-29 – 2019-04-30 (×2): 910 mL via ORAL
  Filled 2019-04-29 (×4): qty 948

## 2019-04-29 NOTE — Progress Notes (Signed)
Nutrition Follow-up  DOCUMENTATION CODES:   Not applicable  INTERVENTION:   Change to nocturnal feedings:  -Jevity 1.5 @ 65 ml x 14 hours (6pm-8 am) -30 ml Prostat BID -Free water flushes 300 ml Q4 hours- per CCM   Provides: 1565 kcals, 88 grams protein, 691 ml free water (2491 ml with flushes). Meets 75% kcal needs and 84% protein needs.   Add Magic cup TID with meals, each supplement provides 290 kcal and 9 grams of protein  NUTRITION DIAGNOSIS:   Increased nutrient needs related to wound healing as evidenced by estimated needs.  Ongoing  GOAL:   Patient will meet greater than or equal to 90% of their needs  Addressed via TF  MONITOR:   PO intake, Supplement acceptance, Weight trends, Labs, I & O's, Diet advancement, Skin, TF tolerance  REASON FOR ASSESSMENT:   Consult Enteral/tube feeding initiation and management  ASSESSMENT:   Patient with PMH significant for bipolar disorder, DM, HTN, schizophrenia, mitral valve MSSA endocarditis, s/p trach with eventually decannulation, and s/p PEG. Presents this admission with septic shock due to presumed urosepsis.   4/15- aspiration event, intubated  4/18- extubated   Pt discussed during ICU rounds and with RN.   Diet advanced to DYS 1 with honey thick yesterday. Last meal completion documented as 100%. Transition to nocturnal feeds to meet ~75% of needs and monitor PO intake. Add supplementation to maximize kcal and protein.   Admission weight: 89.8 kg  Current weight: 86.6 kg   I/O: +8,120 ml since admit  UOP: 325 ml x 24 hrs   Medications: colace, miralax Labs: Na 147 (H) CBG 70-110  Diet Order:   Diet Order            DIET - DYS 1 Room service appropriate? Yes; Fluid consistency: Honey Thick  Diet effective now              EDUCATION NEEDS:   Not appropriate for education at this time  Skin:  Skin Assessment: Skin Integrity Issues: Skin Integrity Issues:: Stage I, Other (Comment) Stage I: R  thigh, coccyx Other: multiple dry/flakey skin sites  Last BM:  4/15  Height:   Ht Readings from Last 1 Encounters:  04/22/19 6\' 2"  (1.88 m)    Weight:   Wt Readings from Last 1 Encounters:  04/29/19 86.6 kg    BMI:  Body mass index is 24.51 kg/m.  Estimated Nutritional Needs:   Kcal:  2100-2300 kcal  Protein:  105-125 grams  Fluid:  >/= 2.1 L/day   05/01/19 RD, LDN Clinical Nutrition Pager listed in AMION

## 2019-04-29 NOTE — Progress Notes (Signed)
  Speech Language Pathology Treatment: Dysphagia  Patient Details Name: Marc [name] MRN: [id number] DOB: 1955/01/25 Today's Date: 04/29/2019 Time: 6195-0932 SLP Time Calculation (min) (ACUTE ONLY): 9 min  Assessment / Plan / Recommendation Clinical Impression  Patient seen for f/u dysphagia treatment following MBS 4/19. Patient alert and cooperative. Per nursing, consumed 100% of am meal without overt s/s of aspiration. Patient agreeable to pureed solids under SLP skilled supervision. Able to consume 2-3 ounces of pureed solid with no overt s/s of aspiration, continued oral delays consistent with MBS results. Note that vocal quality much improved as compared to 4/19. Remains mildly hoarse but with appropriate intensity, 100% intelligible indicating improved glottal closure s/p extubation. Hopeful for ability to at least advance back to nectar thick liquids at some point. Will continue to follow along. Current diet recommendations remain appropriate.     HPI HPI: 64 year old male with recent prolonged admission with MSSA endocarditis during which he required long term intubation and trach. Discharged 4/8. Now admitted 4/13 for presumed septic shock, UTI.  Intubated for acute hypoxic hypercarbic respiratory failure 4/15, extubated 4/18.       SLP Plan  Continue with current plan of care       Recommendations  Diet recommendations: Dysphagia 1 (puree);Honey-thick liquid Liquids provided via: Teaspoon;Cup Medication Administration: Via alternative means Supervision: Staff to assist with self feeding;Full supervision/cueing for compensatory strategies Compensations: Minimize environmental distractions;Slow rate;Small sips/bites;Clear throat after each swallow;Chin tuck;Multiple dry swallows after each bite/sip Postural Changes and/or Swallow Maneuvers: Seated upright 90 degrees                Oral Care Recommendations: Oral care BID Follow up Recommendations: Skilled Nursing  facility;LTACH;24 hour supervision/assistance SLP Visit Diagnosis: Dysphagia, oropharyngeal phase (R13.12) Plan: Continue with current plan of care       GO             Skye Rodarte MA, CCC-SLP     [name] [name] 04/29/2019, 9:59 AM

## 2019-04-29 NOTE — TOC Progression Note (Signed)
Transition of Care [facility]) - Progression Note    Patient Details  Name: Marc [name] MRN: [id number] Date of Birth: Jun 26, 1955  [facility] of [facility]) CM/SW Contact  Terrial Rhodes, LCSWA Phone Number: 04/29/2019, 2:02 PM  Clinical Narrative:     CSW spoke with patient at bedside. Patient was agreeable to SNF placement at [facility] when medically ready for discharge.   CSW will continue to follow.    Expected Discharge Plan: Skilled Nursing Facility Barriers to Discharge: Continued Medical Work up  Expected Discharge Plan and Services Expected Discharge Plan: Skilled Nursing Facility In-house Referral: Clinical Social Work   Post Acute Care Choice: Skilled Nursing Facility Living arrangements for the past 2 months: Single Family Home                                       Social Determinants of Health (SDOH) Interventions    Readmission Risk Interventions No flowsheet data found.

## 2019-04-29 NOTE — Progress Notes (Signed)
PROGRESS NOTE  Marc [name]  ZOX:[id number] DOB: 1955-02-28 DOA: 04/22/2019 PCP: Patient, [name]   Brief Narrative: Marc [name] is a 64 y.o. male with a history of bipolar 1 disorder, schizophrenia,, T2DM, HTN, and prolonged admission for MSSA endocarditis (1/12 - 04/17/2019) requiring trach (decannulated) and PEG, ultimately discharged to SNF but returned to [facility] on 4/13 with septic shock due to UTI. He required pressors and intubation 4/14 for respiratory failure thought to be due to aspiration, eventually improving on antibiotics and extubated 4/18. He was transferred to PCU onto hospitalist service 4/20.  Assessment & Plan: Active Problems:   Hypotension   Sepsis secondary to UTI [facility])   Airway intubation performed without difficulty  Septic shock due to Enterococcus UTI: Blood cultures negative. Shock/sepsis have resolved.  - Complete cefepime per susceptibility data. WBC wnl, afebrile.  RLL aspiration pneumonia, dysphagia, acute hypoxemic and hypercarbic respiratory failure: Extubated 4/18, may require BiPAP intermittently.  - Wean oxygen as able - Goals of care seem to be for aggressive measures. SLP reports significant improvement in exams since extubation, still on dysphagia 1, honey-thick liquids. Will continue to evaluate. - Aspiration precautions, pulmonary hygiene   Schizophrenia, bipolar disorder:  - Continue home seroquel, haldol, trazodone  Hypernatremia:  - Continue free water per tube - Recheck in AM.   Hypoglycemia, prediabetes: HbA1c 6.3%.  - Monitor per protocol.  Hypochromic anemia:  - Check iron studies, repeat CBC in AM.  RN Pressure Injury Documentation: Pressure Injury 04/28/19 Coccyx Medial Stage 1 -  Intact skin with non-blanchable redness of a localized area usually over a bony prominence. (Active)  04/28/19 2000  Location: Coccyx  Location Orientation: Medial  Staging: Stage 1 -  Intact skin with non-blanchable redness of a localized  area usually over a bony prominence.  Wound Description (Comments):   Present on Admission:     DVT prophylaxis: Heparin Code Status: Full Family Communication: None at bedside Disposition Plan:  Status is: Inpatient  Remains inpatient appropriate because:Inpatient level of care appropriate due to severity of illness   Dispo: The patient is from: SNF              Anticipated d/c is to: SNF              Anticipated d/c date is: 1 day              Patient currently is medically stable to d/c.  Consultants:   PCCM primary 4/13 - 4/19  Procedures:   ETT 4/14 - 4/18  CVL 4/13 - 4/18  Antimicrobials:  Vancomycin 4/13 - 4/15  Cefepime 4/13 - 4/20  (Urine culture 4/13 +Enterococcus)   Subjective: Wants to try to get up to the bathroom for BM. Waiting for assistance. He would like to take fluids/solids by mouth in addition/instead of per PEG. Denies shortness of breath, chest pain, abdominal pain.  Objective: Vitals:   04/29/19 0900 04/29/19 1000 04/29/19 1100 04/29/19 1200  BP:  122/70 109/77 125/71  Pulse: (!) 102 90 85   Resp: Temp:      TempSrc:      SpO2: 96% 91% 99%   Weight:      Height:        Intake/Output Summary (Last 24 hours) at 04/29/2019 1225 Last data filed at 04/29/2019 1057 Gross per 24 hour  Intake 2012.36 ml  Output 325 ml  Net 1687.36 ml   Filed Weights   04/27/19 0500 04/28/19  0500 04/29/19 0500  Weight: 88.1 kg 86.2 kg 86.6 kg    Gen: Tall, chronically ill-appearing male in no distress  Pulm: Non-labored with consistent upper airway sounds that are not effectively cleared despite persistent coughing. Rhonchi most in R base. CV: Regular rate and rhythm. No murmur, rub, or gallop. No JVD. GI: Abdomen soft, non-tender, non-distended, with normoactive bowel sounds. No organomegaly or masses felt. Ext: Warm, no deformities. Woody trace edema in LE's. Skin: No rashes, lesions or ulcers. PEG site appears normal. Neuro: Alert and  oriented. No focal neurological deficits. Psych: Judgement and insight appear normal. Mood & affect appropriate.   Data Reviewed: I have personally reviewed following labs and imaging studies  CBC: Recent Labs  Lab 04/22/19 1723 04/22/19 2252 04/24/19 0330 04/24/19 0330 04/25/19 0002 04/25/19 0452 04/26/19 0537 04/27/19 0340 04/28/19 0327  WBC 3.8*   < > 4.0  --   --  9.6 5.3 4.8 4.5  NEUTROABS 2.0  --   --   --   --   --   --   --   --   HGB 10.1*   < > 8.8*   < > 10.9* 9.4* 8.7* 9.0* 10.0*  HCT 35.4*   < > 30.7*   < > 32.0* 32.5* 29.0* 29.6* 34.3*  MCV 88.9   < > 89.5  --   --  88.3 84.1 84.3 87.1  PLT 150   < > 151  --   --  141* 150 143* 135*   < > = values in this interval not displayed.   Basic Metabolic Panel: Recent Labs  Lab 04/23/19 0231 04/23/19 0231 04/24/19 0330 04/24/19 0330 04/25/19 0002 04/25/19 0452 04/26/19 0537 04/27/19 0340 04/28/19 0327  NA 147*   < > 148*   < > 149* 148* 148* 146* 147*  K 4.3   < > 3.6   < > 4.5 4.1 3.9 3.9 4.4  CL 113*   < > 114*  --   --  114* 117* 117* 113*  CO2 29   < > 28  --   --  25 25 23 26   GLUCOSE 90   < > 67*  --   --  96 183* 79 86  BUN 26*   < > 20  --   --  27* 37* 35* 28*  CREATININE 0.67   < > 0.83  --   --  0.97 1.14 0.80 0.73  CALCIUM 7.6*   < > 7.9*  --   --  8.8* 8.3* 8.3* 8.4*  MG 1.9  --  2.2  --   --   --   --   --   --   PHOS 3.7  --   --   --   --   --   --   --   --    < > = values in this interval not displayed.   GFR: Estimated Creatinine Clearance: 108.5 mL/min (by C-G formula based on SCr of 0.73 mg/dL). Liver Function Tests: Recent Labs  Lab 04/22/19 1723  AST 20  ALT 23  ALKPHOS 84  BILITOT 0.4  PROT 4.6*  ALBUMIN 2.2*   No results for input(s): LIPASE, AMYLASE in the last 168 hours. No results for input(s): AMMONIA in the last 168 hours. Coagulation Profile: Recent Labs  Lab 04/22/19 1723  INR 1.1   Cardiac Enzymes: No results for input(s): CKTOTAL, CKMB, CKMBINDEX, TROPONINI  in the last 168 hours. BNP (last  3 results) No results for input(s): PROBNP in the last 8760 hours. HbA1C: No results for input(s): HGBA1C in the last 72 hours. CBG: Recent Labs  Lab 04/28/19 1529 04/28/19 1925 04/28/19 2355 04/29/19 0345 04/29/19 0800  GLUCAP 110* 100* 87 84 105*   Lipid Profile: Recent Labs    04/27/19 0340  TRIG 67   Thyroid Function Tests: No results for input(s): TSH, T4TOTAL, FREET4, T3FREE, THYROIDAB in the last 72 hours. Anemia Panel: No results for input(s): VITAMINB12, FOLATE, FERRITIN, TIBC, IRON, RETICCTPCT in the last 72 hours. Urine analysis:    Component Value Date/Time   COLORURINE YELLOW 04/22/2019 1608   APPEARANCEUR TURBID (A) 04/22/2019 1608   LABSPEC 1.024 04/22/2019 1608   PHURINE 5.0 04/22/2019 1608   GLUCOSEU NEGATIVE 04/22/2019 1608   HGBUR SMALL (A) 04/22/2019 1608   BILIRUBINUR NEGATIVE 04/22/2019 1608   KETONESUR NEGATIVE 04/22/2019 1608   PROTEINUR 100 (A) 04/22/2019 1608   UROBILINOGEN 1.0 05/08/2013 2201   NITRITE NEGATIVE 04/22/2019 1608   LEUKOCYTESUR SMALL (A) 04/22/2019 1608   Recent Results (from the past 240 hour(s))  Urine culture     Status: Abnormal   Collection Time: 04/22/19  4:08 PM   Specimen: In/Out Cath Urine  Result Value Ref Range Status   Specimen Description   Final    IN/OUT CATH URINE Performed at [facility], [address], [place], [place] [address]    Special Requests   Final    NONE Performed at [facility], [address], [place], [place] [address]    Culture >=100,000 COLONIES/mL ENTEROBACTER CLOACAE (A)  Final   Report Status 04/25/2019 FINAL  Final   Organism ID, Bacteria ENTEROBACTER CLOACAE (A)  Final      Susceptibility   Enterobacter cloacae - MIC*    CEFAZOLIN >=64 RESISTANT Resistant     CIPROFLOXACIN <=0.25 SENSITIVE Sensitive     GENTAMICIN <=1 SENSITIVE Sensitive     IMIPENEM 1 SENSITIVE Sensitive     NITROFURANTOIN 32 SENSITIVE Sensitive     TRIMETH/SULFA <=20  SENSITIVE Sensitive     * >=100,000 COLONIES/mL ENTEROBACTER CLOACAE  Blood Culture (routine x 2)     Status: None   Collection Time: 04/22/19  4:42 PM   Specimen: BLOOD RIGHT HAND  Result Value Ref Range Status   Specimen Description BLOOD RIGHT HAND  Final   Special Requests   Final    BOTTLES DRAWN AEROBIC AND ANAEROBIC Blood Culture adequate volume   Culture   Final    NO GROWTH 5 DAYS Performed at [facility], [address], [place], [place] [address]    Report Status 04/27/2019 FINAL  Final  Blood Culture (routine x 2)     Status: None   Collection Time: 04/22/19  4:42 PM   Specimen: BLOOD RIGHT FOREARM  Result Value Ref Range Status   Specimen Description BLOOD RIGHT FOREARM  Final   Special Requests   Final    BOTTLES DRAWN AEROBIC AND ANAEROBIC Blood Culture adequate volume   Culture   Final    NO GROWTH 5 DAYS Performed at [facility], [address], [place], [place] [address]    Report Status 04/27/2019 FINAL  Final  Respiratory Panel by RT PCR (Flu A&B, Covid) - Nasopharyngeal Swab     Status: None   Collection Time: 04/22/19  5:24 PM   Specimen: Nasopharyngeal Swab  Result Value Ref Range Status   SARS Coronavirus 2 by RT PCR NEGATIVE NEGATIVE Final    Comment: (  NOTE) SARS-CoV-2 target nucleic acids are NOT DETECTED. The SARS-CoV-2 RNA is generally detectable in upper respiratoy specimens during the acute phase of infection. The lowest concentration of SARS-CoV-2 viral copies this assay can detect is 131 copies/mL. A negative result does not preclude SARS-Cov-2 infection and should not be used as the sole basis for treatment or other patient management decisions. A negative result may occur with  improper specimen collection/handling, submission of specimen other than nasopharyngeal swab, presence of viral mutation(s) within the areas targeted by this assay, and inadequate number of viral copies (<131 copies/mL). A negative result must be combined with  clinical observations, patient history, and epidemiological information. The expected result is Negative. Fact Sheet for Patients:  https://www.moore.com/ Fact Sheet for Healthcare Providers:  https://www.young.biz/ This test is not yet ap proved or cleared by the [place] FDA and  has been authorized for detection and/or diagnosis of SARS-CoV-2 by FDA under an Emergency Use Authorization (EUA). This EUA will remain  in effect (meaning this test can be used) for the duration of the COVID-19 declaration under Section 564(b)(1) of the Act, 21 U.S.C. section 360bbb-3(b)(1), unless the authorization is terminated or revoked sooner.    Influenza A by PCR NEGATIVE NEGATIVE Final   Influenza B by PCR NEGATIVE NEGATIVE Final    Comment: (NOTE) The Xpert Xpress SARS-CoV-2/FLU/RSV assay is intended as an aid in  the diagnosis of influenza from Nasopharyngeal swab specimens and  should not be used as a sole basis for treatment. Nasal washings and  aspirates are unacceptable for Xpert Xpress SARS-CoV-2/FLU/RSV  testing. Fact Sheet for Patients: https://www.moore.com/ Fact Sheet for Healthcare Providers: https://www.young.biz/ This test is not yet approved or cleared by the [place] FDA and  has been authorized for detection and/or diagnosis of SARS-CoV-2 by  FDA under an Emergency Use Authorization (EUA). This EUA will remain  in effect (meaning this test can be used) for the duration of the  Covid-19 declaration under Section 564(b)(1) of the Act, 21  U.S.C. section 360bbb-3(b)(1), unless the authorization is  terminated or revoked. Performed at [facility], [address] [place], [place] [address]       Radiology Studies: DG Swallowing [facility] Pathology  Result Date: 04/28/2019 Objective Swallowing Evaluation: Type of Study: MBS-Modified Barium Swallow Study  Patient Details Name: JIYAN WALKOWSKI MRN: [id number] Date of Birth: 16-Dec-1955 Today's Date: 04/28/2019 Time: SLP Start Time (ACUTE ONLY): 1330 -SLP Stop Time (ACUTE ONLY): 1400 SLP Time Calculation (min) (ACUTE ONLY): 30 min Past Medical History: Past Medical History: Diagnosis Date . Bipolar 1 disorder (HCC)  . Bipolar affective (HCC)  . Diabetes mellitus without complication (HCC)  . Hypertension  . Schizophrenic disorder [facility])  Past Surgical History: Past Surgical History: Procedure Laterality Date . IR GASTROSTOMY TUBE MOD SED  03/07/2019 HPI: 64 year old male with recent prolonged admission with MSSA endocarditis during which he required long term intubation and trach. Discharged 4/8. Now admitted 4/13 for presumed septic shock, UTI.  Intubated for acute hypoxic hypercarbic respiratory failure 4/15, extubated 4/18.  Subjective: alert, confused Assessment / Plan / Recommendation CHL IP CLINICAL IMPRESSIONS 04/28/2019 Clinical Impression  Pt demonstrates similar result to prior MBS with ongoing slow oral transit with pumping and eventual passive spill to deep pharynx. Swallow response is delayed and at times absent after pt initiates oral transit of lingual residue, but does not always follow up with a swallow. In contrast with prior MBS, pt silently aspirated both thin liquids and nectar thick  liquids if bolus size was greater than a teaspoon. Trials of puree and honey thick liquids, even with larger bolus size resulted in spillage, but no aspiration. Function may be impacted by recent intubation. Recommend pt be allowed teaspoon bites of puree and honey thick liquids for pleasure, though the majority of nutrition should be given enterally until consistent safe intake can be established as pt has been known to have frequent setbacks. Will need conscientious assist with feeding and precautions. Will continue to follow for tolerance and advancement as able.  SLP Visit Diagnosis Dysphagia, oropharyngeal phase (R13.12) Attention and concentration  deficit following -- Frontal lobe and executive function deficit following -- Impact on safety and function Moderate aspiration risk   CHL IP TREATMENT RECOMMENDATION 04/28/2019 Treatment Recommendations Therapy as outlined in treatment plan below   Prognosis 04/28/2019 Prognosis for Safe Diet Advancement Good Barriers to Reach Goals -- Barriers/Prognosis Comment -- CHL IP DIET RECOMMENDATION 04/28/2019 SLP Diet Recommendations Dysphagia 1 (Puree) solids;Honey thick liquids Liquid Administration via Spoon Medication Administration Crushed with puree Compensations Minimize environmental distractions;Slow rate;Small sips/bites;Clear throat after each swallow;Chin tuck;Multiple dry swallows after each bite/sip Postural Changes --   CHL IP OTHER RECOMMENDATIONS 04/16/2019 Recommended Consults -- Oral Care Recommendations Oral care QID Other Recommendations Order thickener from pharmacy;Prohibited food (jello, ice cream, thin soups);Remove water pitcher;Have oral suction available   CHL IP FOLLOW UP RECOMMENDATIONS 04/28/2019 Follow up Recommendations Skilled Nursing facility;LTACH;24 hour supervision/assistance   CHL IP FREQUENCY AND DURATION 04/16/2019 Speech Therapy Frequency (ACUTE ONLY) min 2x/week Treatment Duration 2 weeks      CHL IP ORAL PHASE 04/28/2019 Oral Phase Impaired Oral - Pudding Teaspoon -- Oral - Pudding Cup -- Oral - Honey Teaspoon Decreased bolus cohesion;Delayed oral transit;Lingual/palatal residue;Reduced posterior propulsion Oral - Honey Cup Decreased bolus cohesion;Delayed oral transit;Lingual/palatal residue;Reduced posterior propulsion Oral - Nectar Teaspoon Decreased bolus cohesion;Delayed oral transit;Lingual/palatal residue;Reduced posterior propulsion Oral - Nectar Cup Decreased bolus cohesion;Delayed oral transit;Lingual/palatal residue;Reduced posterior propulsion Oral - Nectar Straw Decreased bolus cohesion;Delayed oral transit;Lingual/palatal residue;Reduced posterior propulsion Oral - Thin  Teaspoon Decreased bolus cohesion;Delayed oral transit;Lingual/palatal residue;Reduced posterior propulsion Oral - Thin Cup Decreased bolus cohesion;Delayed oral transit;Lingual/palatal residue;Reduced posterior propulsion Oral - Thin Straw -- Oral - Puree Decreased bolus cohesion;Delayed oral transit;Lingual/palatal residue;Reduced posterior propulsion Oral - Mech Soft Decreased bolus cohesion;Delayed oral transit;Lingual/palatal residue;Reduced posterior propulsion Oral - Regular Decreased bolus cohesion;Delayed oral transit;Lingual/palatal residue;Reduced posterior propulsion Oral - Multi-Consistency -- Oral - Pill -- Oral Phase - Comment --  CHL IP PHARYNGEAL PHASE 04/28/2019 Pharyngeal Phase Impaired Pharyngeal- Pudding Teaspoon -- Pharyngeal -- Pharyngeal- Pudding Cup -- Pharyngeal -- Pharyngeal- Honey Teaspoon Delayed swallow initiation-pyriform sinuses Pharyngeal Material does not enter airway Pharyngeal- Honey Cup Delayed swallow initiation-pyriform sinuses Pharyngeal -- Pharyngeal- Nectar Teaspoon Delayed swallow initiation-pyriform sinuses Pharyngeal Material does not enter airway Pharyngeal- Nectar Cup Delayed swallow initiation-pyriform sinuses;Penetration/Aspiration during swallow;Penetration/Aspiration before swallow Pharyngeal Material enters airway, passes BELOW cords without attempt by patient to eject out (silent aspiration) Pharyngeal- Nectar Straw Penetration/Aspiration before swallow;Penetration/Aspiration during swallow;Delayed swallow initiation-pyriform sinuses;Trace aspiration Pharyngeal Material enters airway, passes BELOW cords then ejected out Pharyngeal- Thin Teaspoon Delayed swallow initiation-pyriform sinuses Pharyngeal Material does not enter airway Pharyngeal- Thin Cup Delayed swallow initiation-pyriform sinuses;Penetration/Aspiration before swallow;Penetration/Aspiration during swallow Pharyngeal Material enters airway, passes BELOW cords without attempt by patient to eject out  (silent aspiration) Pharyngeal- Thin Straw -- Pharyngeal -- Pharyngeal- Puree Delayed swallow initiation-pyriform sinuses Pharyngeal -- Pharyngeal- Mechanical Soft NT Pharyngeal -- Pharyngeal- Regular -- Pharyngeal -- Pharyngeal- Multi-consistency --  Pharyngeal -- Pharyngeal- Pill -- Pharyngeal -- Pharyngeal Comment --  CHL IP CERVICAL ESOPHAGEAL PHASE 04/16/2019 Cervical Esophageal Phase WFL Pudding Teaspoon -- Pudding Cup -- Honey Teaspoon -- Honey Cup -- Nectar Teaspoon -- Nectar Cup -- Nectar Straw -- Thin Teaspoon -- Thin Cup -- Thin Straw -- Puree -- Mechanical Soft -- Regular -- Multi-consistency -- Pill -- Cervical Esophageal Comment -- Harlon Ditty, MA CCC-SLP Acute Rehabilitation Services Pager [phone] Office [phone] Claudine Mouton 04/28/2019, 2:25 PM               Scheduled Meds: . docusate  100 mg Oral BID  . feeding supplement (PRO-STAT SUGAR FREE 64)  30 mL Per Tube BID  . free water  300 mL Per Tube Q4H  . glycopyrrolate  1 mg Per Tube TID  . haloperidol  1 mg Per Tube BID  . heparin  5,000 Units Subcutaneous Q8H  . midodrine  5 mg Oral TID WC  . polyethylene glycol  17 g Oral Daily  . QUEtiapine  25 mg Per Tube QHS  . rocuronium  100 mg Intravenous Once  . sodium chloride flush  10-40 mL Intracatheter Q12H   Continuous Infusions: . sodium chloride Stopped (04/26/19 1359)  . ceFEPime (MAXIPIME) IV 2 g (04/29/19 0957)  . famotidine (PEPCID) IV 20 mg (04/29/19 1057)  . feeding supplement (VITAL AF 1.2 CAL) 1,000 mL (04/28/19 1000)     LOS: 7 days   Time spent: 25 minutes.  Tyrone Nine, MD [facility] [email or web address] 04/29/2019, 12:25 PM

## 2019-04-29 NOTE — Progress Notes (Signed)
Son called and left message on answering machine for where his dad is located at now with his transfer.  Patient transferred to [facility]

## 2019-04-30 DIAGNOSIS — I9589 Other hypotension: Secondary | ICD-10-CM

## 2019-04-30 LAB — BASIC METABOLIC PANEL
Anion gap: 7 (ref 5–15)
BUN: 31 mg/dL — ABNORMAL HIGH (ref 8–23)
CO2: 31 mmol/L (ref 22–32)
Calcium: 8.1 mg/dL — ABNORMAL LOW (ref 8.9–10.3)
Chloride: 111 mmol/L (ref 98–111)
Creatinine, Ser: 0.53 mg/dL — ABNORMAL LOW (ref 0.61–1.24)
GFR calc Af Amer: >60 mL/min (ref >60)
GFR calc non Af Amer: >60 mL/min (ref >60)
Glucose, Bld: 90 mg/dL (ref 70–99)
Potassium: 3.8 mmol/L (ref 3.5–5.1)
Sodium: 149 mmol/L — ABNORMAL HIGH (ref 135–145)

## 2019-04-30 LAB — GLUCOSE, CAPILLARY
Glucose-Capillary: 110 mg/dL — ABNORMAL HIGH (ref 70–99)
Glucose-Capillary: 110 mg/dL — ABNORMAL HIGH (ref 70–99)
Glucose-Capillary: 113 mg/dL — ABNORMAL HIGH (ref 70–99)
Glucose-Capillary: 134 mg/dL — ABNORMAL HIGH (ref 70–99)
Glucose-Capillary: 85 mg/dL (ref 70–99)
Glucose-Capillary: 95 mg/dL (ref 70–99)

## 2019-04-30 LAB — CBC
HCT: 37.9 % — ABNORMAL LOW (ref 39.0–52.0)
Hemoglobin: 10.8 g/dL — ABNORMAL LOW (ref 13.0–17.0)
MCH: 25.2 pg — ABNORMAL LOW (ref 26.0–34.0)
MCHC: 28.5 g/dL — ABNORMAL LOW (ref 30.0–36.0)
MCV: 88.3 fL (ref 80.0–100.0)
Platelets: 124 10*3/uL — ABNORMAL LOW (ref 150–400)
RBC: 4.29 MIL/uL (ref 4.22–5.81)
RDW: 19.1 % — ABNORMAL HIGH (ref 11.5–15.5)
WBC: 5.2 10*3/uL (ref 4.0–10.5)
nRBC: 0 % (ref 0.0–0.2)

## 2019-04-30 LAB — IRON AND TIBC
Iron: 62 ug/dL (ref 45–182)
Saturation Ratios: 41 % — ABNORMAL HIGH (ref 17.9–39.5)
TIBC: 151 ug/dL — ABNORMAL LOW (ref 250–450)
UIBC: 89 ug/dL

## 2019-04-30 LAB — FERRITIN: Ferritin: 326 ng/mL (ref 24–336)

## 2019-04-30 LAB — SARS CORONAVIRUS 2 (TAT 6-24 HRS): SARS Coronavirus 2: NEGATIVE

## 2019-04-30 MED ORDER — DOCUSATE SODIUM 50 MG/5ML PO LIQD
100.0000 mg | Freq: Two times a day (BID) | ORAL | Status: DC
  Administered 2019-04-30 – 2019-05-02 (×5): 100 mg
  Filled 2019-04-30 (×6): qty 10

## 2019-04-30 MED ORDER — DIPHENHYDRAMINE-ZINC ACETATE 2-0.1 % EX CREA
TOPICAL_CREAM | Freq: Two times a day (BID) | CUTANEOUS | Status: DC | PRN
  Filled 2019-04-30: qty 28

## 2019-04-30 MED ORDER — MIDODRINE HCL 5 MG PO TABS
5.0000 mg | ORAL_TABLET | Freq: Three times a day (TID) | ORAL | Status: DC
  Administered 2019-04-30 – 2019-05-02 (×7): 5 mg
  Filled 2019-04-30 (×7): qty 1

## 2019-04-30 MED ORDER — PREDNISONE 10 MG (21) PO TBPK
20.0000 mg | ORAL_TABLET | Freq: Every evening | ORAL | Status: AC
  Administered 2019-04-30: 20 mg via ORAL

## 2019-04-30 MED ORDER — HYDROXYZINE HCL 10 MG PO TABS
10.0000 mg | ORAL_TABLET | Freq: Three times a day (TID) | ORAL | Status: DC | PRN
  Administered 2019-04-30 (×2): 10 mg
  Filled 2019-04-30: qty 1

## 2019-04-30 MED ORDER — PREDNISONE 10 MG (21) PO TBPK
10.0000 mg | ORAL_TABLET | ORAL | Status: AC
  Administered 2019-04-30: 10 mg via ORAL

## 2019-04-30 MED ORDER — PREDNISONE 10 MG (21) PO TBPK
20.0000 mg | ORAL_TABLET | Freq: Every morning | ORAL | Status: AC
  Filled 2019-04-30: qty 21

## 2019-04-30 MED ORDER — PREDNISONE 10 MG (21) PO TBPK
10.0000 mg | ORAL_TABLET | Freq: Three times a day (TID) | ORAL | Status: AC
  Administered 2019-05-01 (×3): 10 mg via ORAL

## 2019-04-30 MED ORDER — PREDNISONE 10 MG (21) PO TBPK
20.0000 mg | ORAL_TABLET | Freq: Every evening | ORAL | Status: AC
  Administered 2019-05-01: 20 mg via ORAL

## 2019-04-30 MED ORDER — PREDNISONE 10 MG (21) PO TBPK
10.0000 mg | ORAL_TABLET | Freq: Four times a day (QID) | ORAL | Status: DC
  Administered 2019-05-02 (×2): 10 mg via ORAL

## 2019-04-30 NOTE — Progress Notes (Signed)
PROGRESS NOTE  EMIN FOREE  NLG:[id number] DOB: [birth year] DOA: 04/22/2019 PCP: Patient, [name]   Brief Narrative: Marc [name] is a 64 y.o. male with a history of bipolar 1 disorder, schizophrenia,, T2DM, HTN, and prolonged admission for MSSA endocarditis (1/12 - 04/17/2019) requiring trach (decannulated) and PEG, ultimately discharged to SNF but returned to [facility] on 4/13 with septic shock due to UTI. He required pressors and intubation 4/14 for respiratory failure thought to be due to aspiration, eventually improving on antibiotics and extubated 4/18. He was transferred to PCU onto hospitalist service 4/20.  Assessment & Plan: Active Problems:   Hypotension   Sepsis secondary to UTI [facility])   Airway intubation performed without difficulty  Septic shock due to Enterococcus UTI: Blood cultures negative. Shock/sepsis have resolved.  - Completed cefepime per susceptibility data. WBC wnl, afebrile.  RLL aspiration pneumonia, dysphagia, acute hypoxemic and hypercarbic respiratory failure: Extubated 4/18, may require BiPAP intermittently.  - Wean oxygen as able SpO2: 98 % O2 Flow Rate (L/min): 2 L/min FiO2 (%): 40 % - Goals of care seem to be for aggressive measures. SLP reports significant improvement in exams since extubation, still on dysphagia 1, honey-thick liquids. Will continue to evaluate. - Aspiration precautions, pulmonary hygiene   Schizophrenia, bipolar disorder:  - Continue home seroquel, haldol, trazodone  Hypernatremia, ongoing:  - Continue free water per tube - likely need to increase - will discuss with dietary  Hypoglycemia, prediabetes: HbA1c 6.3%.  - Monitor per protocol.  Hypochromic anemia, likely chronic anemia of chronic disease:  - Continue to follow with AM labs  RN Pressure Injury Documentation: Pressure Injury 04/28/19 Coccyx Medial Stage 1 -  Intact skin with non-blanchable redness of a localized area usually over a bony prominence. (Active)    04/28/19 2000  Location: Coccyx  Location Orientation: Medial  Staging: Stage 1 -  Intact skin with non-blanchable redness of a localized area usually over a bony prominence.  Wound Description (Comments):   Present on Admission:     DVT prophylaxis: Heparin Code Status: Full Family Communication: None at bedside Disposition Plan:  Status is: Inpatient Remains inpatient appropriate because:Inpatient level of care appropriate due to severity of illness Dispo: The patient is from: SNF              Anticipated d/c is to: SNF              Anticipated d/c date is: 1 day              Patient currently is medically stable to d/c.  Consultants:   PCCM primary 4/13 - 4/19  Procedures:   ETT 4/14 - 4/18  CVL 4/13 - 4/18  Antimicrobials:  Vancomycin 4/13 - 4/15  Cefepime 4/13 - 4/20  (Urine culture 4/13 +Enterococcus)   Subjective: No acute issues or events overnight, complaining of itchy skin, discussed with nurse appears to be eczema, patient otherwise declines nausea, vomiting, diarrhea, constipation, headache, fevers, chills.  Review of systems is somewhat limited given patient's baseline mental status at this point.  Objective: Vitals:   04/29/19 1707 04/29/19 2004 04/30/19 0022 04/30/19 0506  BP: 123/62 129/87 113/74 (!) 118/100  Pulse: 68 78 70 66  Resp: 17 16 12 11   Temp:  98.5 F (36.9 C) 98.9 F (37.2 C) 98 F (36.7 C)  TempSrc:  Oral Oral Oral  SpO2: 100% 96% 95% 100%  Weight:      Height:        Intake/Output  Summary (Last 24 hours) at 04/30/2019 0706 Last data filed at 04/29/2019 1805 Gross per 24 hour  Intake 1050.03 ml  Output --  Net 1050.03 ml   Filed Weights   04/27/19 0500 04/28/19 0500 04/29/19 0500  Weight: 88.1 kg 86.2 kg 86.6 kg    Gen: Tall, chronically ill-appearing male in no distress  Pulm: Non-labored with consistent upper airway sounds that are not effectively cleared despite persistent coughing. Rhonchi most in R base. CV:  Regular rate and rhythm. No murmur, rub, or gallop. No JVD. GI: Abdomen soft, non-tender, non-distended, with normoactive bowel sounds. No organomegaly or masses felt. Ext: Warm, no deformities. Woody trace edema in LE's. Skin: No rashes, lesions or ulcers. PEG site appears normal. Neuro: Alert and oriented. No focal neurological deficits.   Data Reviewed: I have personally reviewed following labs and imaging studies  CBC: Recent Labs  Lab 04/25/19 0452 04/26/19 0537 04/27/19 0340 04/28/19 0327 04/30/19 0427  WBC 9.6 5.3 4.8 4.5 5.2  HGB 9.4* 8.7* 9.0* 10.0* 10.8*  HCT 32.5* 29.0* 29.6* 34.3* 37.9*  MCV 88.3 84.1 84.3 87.1 88.3  PLT 141* 150 143* 135* 124*   Basic Metabolic Panel: Recent Labs  Lab 04/24/19 0330 04/24/19 0330 04/25/19 0002 04/25/19 0452 04/26/19 0537 04/27/19 0340 04/28/19 0327  NA 148*   < > 149* 148* 148* 146* 147*  K 3.6   < > 4.5 4.1 3.9 3.9 4.4  CL 114*  --   --  114* 117* 117* 113*  CO2 28  --   --  25 25 23 26   GLUCOSE 67*  --   --  96 183* 79 86  BUN 20  --   --  27* 37* 35* 28*  CREATININE 0.83  --   --  0.97 1.14 0.80 0.73  CALCIUM 7.9*  --   --  8.8* 8.3* 8.3* 8.4*  MG 2.2  --   --   --   --   --   --    < > = values in this interval not displayed.   GFR: Estimated Creatinine Clearance: 108.5 mL/min (by C-G formula based on SCr of 0.73 mg/dL). Liver Function Tests: No results for input(s): AST, ALT, ALKPHOS, BILITOT, PROT, ALBUMIN in the last 168 hours. No results for input(s): LIPASE, AMYLASE in the last 168 hours. No results for input(s): AMMONIA in the last 168 hours. Coagulation Profile: No results for input(s): INR, PROTIME in the last 168 hours. Cardiac Enzymes: No results for input(s): CKTOTAL, CKMB, CKMBINDEX, TROPONINI in the last 168 hours. BNP (last 3 results) No results for input(s): PROBNP in the last 8760 hours. HbA1C: No results for input(s): HGBA1C in the last 72 hours. CBG: Recent Labs  Lab 04/29/19 0800  04/29/19 1704 04/29/19 2103 04/30/19 0020 04/30/19 0406  GLUCAP 105* 78 110* 113* 95   Lipid Profile: No results for input(s): CHOL, HDL, LDLCALC, TRIG, CHOLHDL, LDLDIRECT in the last 72 hours. Thyroid Function Tests: No results for input(s): TSH, T4TOTAL, FREET4, T3FREE, THYROIDAB in the last 72 hours. Anemia Panel: No results for input(s): VITAMINB12, FOLATE, FERRITIN, TIBC, IRON, RETICCTPCT in the last 72 hours. Urine analysis:    Component Value Date/Time   COLORURINE YELLOW 04/22/2019 1608   APPEARANCEUR TURBID (A) 04/22/2019 1608   LABSPEC 1.024 04/22/2019 1608   PHURINE 5.0 04/22/2019 1608   GLUCOSEU NEGATIVE 04/22/2019 1608   HGBUR SMALL (A) 04/22/2019 1608   BILIRUBINUR NEGATIVE 04/22/2019 1608   KETONESUR NEGATIVE 04/22/2019 1608  PROTEINUR 100 (A) 04/22/2019 1608   UROBILINOGEN 1.0 05/08/2013 2201   NITRITE NEGATIVE 04/22/2019 1608   LEUKOCYTESUR SMALL (A) 04/22/2019 1608   Recent Results (from the past 240 hour(s))  Urine culture     Status: Abnormal   Collection Time: 04/22/19  4:08 PM   Specimen: In/Out Cath Urine  Result Value Ref Range Status   Specimen Description   Final    IN/OUT CATH URINE Performed at [facility], [address] [place], [place] [address]    Special Requests   Final    NONE Performed at [facility], [address] [place], [place] [address]    Culture >=100,000 COLONIES/mL ENTEROBACTER CLOACAE (A)  Final   Report Status 04/25/2019 FINAL  Final   Organism ID, Bacteria ENTEROBACTER CLOACAE (A)  Final      Susceptibility   Enterobacter cloacae - MIC*    CEFAZOLIN >=64 RESISTANT Resistant     CIPROFLOXACIN <=0.25 SENSITIVE Sensitive     GENTAMICIN <=1 SENSITIVE Sensitive     IMIPENEM 1 SENSITIVE Sensitive     NITROFURANTOIN 32 SENSITIVE Sensitive     TRIMETH/SULFA <=20 SENSITIVE Sensitive     * >=100,000 COLONIES/mL ENTEROBACTER CLOACAE  Blood Culture (routine x 2)     Status: None   Collection Time: 04/22/19  4:42 PM    Specimen: BLOOD RIGHT HAND  Result Value Ref Range Status   Specimen Description BLOOD RIGHT HAND  Final   Special Requests   Final    BOTTLES DRAWN AEROBIC AND ANAEROBIC Blood Culture adequate volume   Culture   Final    NO GROWTH 5 DAYS Performed at [facility], [address] [place], [place] [address]    Report Status 04/27/2019 FINAL  Final  Blood Culture (routine x 2)     Status: None   Collection Time: 04/22/19  4:42 PM   Specimen: BLOOD RIGHT FOREARM  Result Value Ref Range Status   Specimen Description BLOOD RIGHT FOREARM  Final   Special Requests   Final    BOTTLES DRAWN AEROBIC AND ANAEROBIC Blood Culture adequate volume   Culture   Final    NO GROWTH 5 DAYS Performed at [facility], [address] [place], [place] [address]    Report Status 04/27/2019 FINAL  Final  Respiratory Panel by RT PCR (Flu A&B, Covid) - Nasopharyngeal Swab     Status: None   Collection Time: 04/22/19  5:24 PM   Specimen: Nasopharyngeal Swab  Result Value Ref Range Status   SARS Coronavirus 2 by RT PCR NEGATIVE NEGATIVE Final    Comment: (NOTE) SARS-CoV-2 target nucleic acids are NOT DETECTED. The SARS-CoV-2 RNA is generally detectable in upper respiratoy specimens during the acute phase of infection. The lowest concentration of SARS-CoV-2 viral copies this assay can detect is 131 copies/mL. A negative result does not preclude SARS-Cov-2 infection and should not be used as the sole basis for treatment or other patient management decisions. A negative result may occur with  improper specimen collection/handling, submission of specimen other than nasopharyngeal swab, presence of viral mutation(s) within the areas targeted by this assay, and inadequate number of viral copies (<131 copies/mL). A negative result must be combined with clinical observations, patient history, and epidemiological information. The expected result is Negative. Fact Sheet for Patients:   https://www.moore.com/https://www.fda.gov/media/142436/download Fact Sheet for Healthcare Providers:  https://www.young.biz/https://www.fda.gov/media/142435/download This test is not yet ap proved or cleared by the [place] FDA and  has been authorized for detection and/or diagnosis of  SARS-CoV-2 by FDA under an Emergency Use Authorization (EUA). This EUA will remain  in effect (meaning this test can be used) for the duration of the COVID-19 declaration under Section 564(b)(1) of the Act, 21 U.S.C. section 360bbb-3(b)(1), unless the authorization is terminated or revoked sooner.    Influenza A by PCR NEGATIVE NEGATIVE Final   Influenza B by PCR NEGATIVE NEGATIVE Final    Comment: (NOTE) The Xpert Xpress SARS-CoV-2/FLU/RSV assay is intended as an aid in  the diagnosis of influenza from Nasopharyngeal swab specimens and  should not be used as a sole basis for treatment. Nasal washings and  aspirates are unacceptable for Xpert Xpress SARS-CoV-2/FLU/RSV  testing. Fact Sheet for Patients: https://www.moore.com/ Fact Sheet for Healthcare Providers: https://www.young.biz/ This test is not yet approved or cleared by the [place] FDA and  has been authorized for detection and/or diagnosis of SARS-CoV-2 by  FDA under an Emergency Use Authorization (EUA). This EUA will remain  in effect (meaning this test can be used) for the duration of the  Covid-19 declaration under Section 564(b)(1) of the Act, 21  U.S.C. section 360bbb-3(b)(1), unless the authorization is  terminated or revoked. Performed at [facility], [address] [place], [place] [address]       Radiology Studies: DG Swallowing [facility] Pathology  Result Date: 04/28/2019 Objective Swallowing Evaluation: Type of Study: MBS-Modified Barium Swallow Study  Patient Details Name: DEDRIC ETHINGTON MRN: [id number] Date of Birth: [birth year] Today's Date: 04/28/2019 Time: SLP Start Time (ACUTE ONLY): 1330 -SLP Stop Time (ACUTE  ONLY): 1400 SLP Time Calculation (min) (ACUTE ONLY): 30 min Past Medical History: Past Medical History: Diagnosis Date . Bipolar 1 disorder (HCC)  . Bipolar affective (HCC)  . Diabetes mellitus without complication (HCC)  . Hypertension  . Schizophrenic disorder [facility])  Past Surgical History: Past Surgical History: Procedure Laterality Date . IR GASTROSTOMY TUBE MOD SED  03/07/2019 HPI: 64 year old male with recent prolonged admission with MSSA endocarditis during which he required long term intubation and trach. Discharged 4/8. Now admitted 4/13 for presumed septic shock, UTI.  Intubated for acute hypoxic hypercarbic respiratory failure 4/15, extubated 4/18.  Subjective: alert, confused Assessment / Plan / Recommendation CHL IP CLINICAL IMPRESSIONS 04/28/2019 Clinical Impression  Pt demonstrates similar result to prior MBS with ongoing slow oral transit with pumping and eventual passive spill to deep pharynx. Swallow response is delayed and at times absent after pt initiates oral transit of lingual residue, but does not always follow up with a swallow. In contrast with prior MBS, pt silently aspirated both thin liquids and nectar thick liquids if bolus size was greater than a teaspoon. Trials of puree and honey thick liquids, even with larger bolus size resulted in spillage, but no aspiration. Function may be impacted by recent intubation. Recommend pt be allowed teaspoon bites of puree and honey thick liquids for pleasure, though the majority of nutrition should be given enterally until consistent safe intake can be established as pt has been known to have frequent setbacks. Will need conscientious assist with feeding and precautions. Will continue to follow for tolerance and advancement as able.  SLP Visit Diagnosis Dysphagia, oropharyngeal phase (R13.12) Attention and concentration deficit following -- Frontal lobe and executive function deficit following -- Impact on safety and function Moderate aspiration risk    CHL IP TREATMENT RECOMMENDATION 04/28/2019 Treatment Recommendations Therapy as outlined in treatment plan below   Prognosis 04/28/2019 Prognosis for Safe Diet Advancement Good Barriers to Reach Goals -- Barriers/Prognosis Comment --  CHL IP DIET RECOMMENDATION 04/28/2019 SLP Diet Recommendations Dysphagia 1 (Puree) solids;Honey thick liquids Liquid Administration via Spoon Medication Administration Crushed with puree Compensations Minimize environmental distractions;Slow rate;Small sips/bites;Clear throat after each swallow;Chin tuck;Multiple dry swallows after each bite/sip Postural Changes --   CHL IP OTHER RECOMMENDATIONS 04/16/2019 Recommended Consults -- Oral Care Recommendations Oral care QID Other Recommendations Order thickener from pharmacy;Prohibited food (jello, ice cream, thin soups);Remove water pitcher;Have oral suction available   CHL IP FOLLOW UP RECOMMENDATIONS 04/28/2019 Follow up Recommendations Skilled Nursing facility;LTACH;24 hour supervision/assistance   CHL IP FREQUENCY AND DURATION 04/16/2019 Speech Therapy Frequency (ACUTE ONLY) min 2x/week Treatment Duration 2 weeks      CHL IP ORAL PHASE 04/28/2019 Oral Phase Impaired Oral - Pudding Teaspoon -- Oral - Pudding Cup -- Oral - Honey Teaspoon Decreased bolus cohesion;Delayed oral transit;Lingual/palatal residue;Reduced posterior propulsion Oral - Honey Cup Decreased bolus cohesion;Delayed oral transit;Lingual/palatal residue;Reduced posterior propulsion Oral - Nectar Teaspoon Decreased bolus cohesion;Delayed oral transit;Lingual/palatal residue;Reduced posterior propulsion Oral - Nectar Cup Decreased bolus cohesion;Delayed oral transit;Lingual/palatal residue;Reduced posterior propulsion Oral - Nectar Straw Decreased bolus cohesion;Delayed oral transit;Lingual/palatal residue;Reduced posterior propulsion Oral - Thin Teaspoon Decreased bolus cohesion;Delayed oral transit;Lingual/palatal residue;Reduced posterior propulsion Oral - Thin Cup Decreased  bolus cohesion;Delayed oral transit;Lingual/palatal residue;Reduced posterior propulsion Oral - Thin Straw -- Oral - Puree Decreased bolus cohesion;Delayed oral transit;Lingual/palatal residue;Reduced posterior propulsion Oral - Mech Soft Decreased bolus cohesion;Delayed oral transit;Lingual/palatal residue;Reduced posterior propulsion Oral - Regular Decreased bolus cohesion;Delayed oral transit;Lingual/palatal residue;Reduced posterior propulsion Oral - Multi-Consistency -- Oral - Pill -- Oral Phase - Comment --  CHL IP PHARYNGEAL PHASE 04/28/2019 Pharyngeal Phase Impaired Pharyngeal- Pudding Teaspoon -- Pharyngeal -- Pharyngeal- Pudding Cup -- Pharyngeal -- Pharyngeal- Honey Teaspoon Delayed swallow initiation-pyriform sinuses Pharyngeal Material does not enter airway Pharyngeal- Honey Cup Delayed swallow initiation-pyriform sinuses Pharyngeal -- Pharyngeal- Nectar Teaspoon Delayed swallow initiation-pyriform sinuses Pharyngeal Material does not enter airway Pharyngeal- Nectar Cup Delayed swallow initiation-pyriform sinuses;Penetration/Aspiration during swallow;Penetration/Aspiration before swallow Pharyngeal Material enters airway, passes BELOW cords without attempt by patient to eject out (silent aspiration) Pharyngeal- Nectar Straw Penetration/Aspiration before swallow;Penetration/Aspiration during swallow;Delayed swallow initiation-pyriform sinuses;Trace aspiration Pharyngeal Material enters airway, passes BELOW cords then ejected out Pharyngeal- Thin Teaspoon Delayed swallow initiation-pyriform sinuses Pharyngeal Material does not enter airway Pharyngeal- Thin Cup Delayed swallow initiation-pyriform sinuses;Penetration/Aspiration before swallow;Penetration/Aspiration during swallow Pharyngeal Material enters airway, passes BELOW cords without attempt by patient to eject out (silent aspiration) Pharyngeal- Thin Straw -- Pharyngeal -- Pharyngeal- Puree Delayed swallow initiation-pyriform sinuses Pharyngeal --  Pharyngeal- Mechanical Soft NT Pharyngeal -- Pharyngeal- Regular -- Pharyngeal -- Pharyngeal- Multi-consistency -- Pharyngeal -- Pharyngeal- Pill -- Pharyngeal -- Pharyngeal Comment --  CHL IP CERVICAL ESOPHAGEAL PHASE 04/16/2019 Cervical Esophageal Phase WFL Pudding Teaspoon -- Pudding Cup -- Honey Teaspoon -- Honey Cup -- Nectar Teaspoon -- Nectar Cup -- Nectar Straw -- Thin Teaspoon -- Thin Cup -- Thin Straw -- Puree -- Mechanical Soft -- Regular -- Multi-consistency -- Pill -- Cervical Esophageal Comment -- Herbie Baltimore, MA CCC-SLP Acute Rehabilitation Services Pager [phone] Office [phone] Lynann Beaver 04/28/2019, 2:25 PM               Scheduled Meds: . docusate  100 mg Oral BID  . feeding supplement (JEVITY 1.5 CAL/FIBER)  910 mL Oral Q24H  . feeding supplement (PRO-STAT SUGAR FREE 64)  30 mL Per Tube BID  . free water  300 mL Per Tube Q4H  . glycopyrrolate  1 mg Per Tube TID  . haloperidol  1  mg Per Tube BID  . heparin  5,000 Units Subcutaneous Q8H  . midodrine  5 mg Oral TID WC  . polyethylene glycol  17 g Oral Daily  . QUEtiapine  25 mg Per Tube QHS  . sodium chloride flush  10-40 mL Intracatheter Q12H  . traZODone  50 mg Per Tube QHS   Continuous Infusions: . sodium chloride Stopped (04/26/19 1359)  . famotidine (PEPCID) IV 20 mg (04/29/19 2218)     LOS: 8 days   Time spent: 25 minutes.  Azucena Fallen, DO [facility] [email or web address] 04/30/2019, 7:06 AM

## 2019-04-30 NOTE — Progress Notes (Signed)
Pt had respiratory count of 9  Pt was under no respiratory distress and had pulse ox reading of 100%  Vitals read  BP 122/89   Pulse 64   Temp 98 F (36.7 C) (Oral)   Resp (!) 9   Ht 6\' 2"  (1.88 m)   Wt 86.6 kg   SpO2 100%   BMI 24.51 kg/m   Pt was awakened from sleep and respirations increased to 14 and maintained at 14  Will continue to monitor

## 2019-04-30 NOTE — Care Management Important Message (Signed)
Important Message  Patient Details  Name: Marc [name] MRN: [id number] Date of Birth: 08-24-1955   Medicare Important Message Given:  Yes     Renie Ora 04/30/2019, 8:13 AM
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
## 2019-04-30 NOTE — Plan of Care (Signed)

## 2019-05-01 LAB — COMPREHENSIVE METABOLIC PANEL
ALT: 25 U/L (ref 0–44)
AST: 28 U/L (ref 15–41)
Albumin: 2.5 g/dL — ABNORMAL LOW (ref 3.5–5.0)
Alkaline Phosphatase: 82 U/L (ref 38–126)
Anion gap: 6 (ref 5–15)
BUN: 29 mg/dL — ABNORMAL HIGH (ref 8–23)
CO2: 31 mmol/L (ref 22–32)
Calcium: 8.4 mg/dL — ABNORMAL LOW (ref 8.9–10.3)
Chloride: 112 mmol/L — ABNORMAL HIGH (ref 98–111)
Creatinine, Ser: 0.55 mg/dL — ABNORMAL LOW (ref 0.61–1.24)
GFR calc Af Amer: >60 mL/min (ref >60)
GFR calc non Af Amer: >60 mL/min (ref >60)
Glucose, Bld: 157 mg/dL — ABNORMAL HIGH (ref 70–99)
Potassium: 4.8 mmol/L (ref 3.5–5.1)
Sodium: 149 mmol/L — ABNORMAL HIGH (ref 135–145)
Total Bilirubin: 0.1 mg/dL — ABNORMAL LOW (ref 0.3–1.2)
Total Protein: 5.3 g/dL — ABNORMAL LOW (ref 6.5–8.1)

## 2019-05-01 LAB — CBC WITH DIFFERENTIAL/PLATELET
Abs Immature Granulocytes: 0.02 10*3/uL (ref 0.00–0.07)
Basophils Absolute: 0 10*3/uL (ref 0.0–0.1)
Basophils Relative: 0 %
Eosinophils Absolute: 0 10*3/uL (ref 0.0–0.5)
Eosinophils Relative: 1 %
HCT: 38.3 % — ABNORMAL LOW (ref 39.0–52.0)
Hemoglobin: 11 g/dL — ABNORMAL LOW (ref 13.0–17.0)
Immature Granulocytes: 1 %
Lymphocytes Relative: 39 %
Lymphs Abs: 1.2 10*3/uL (ref 0.7–4.0)
MCH: 25.3 pg — ABNORMAL LOW (ref 26.0–34.0)
MCHC: 28.7 g/dL — ABNORMAL LOW (ref 30.0–36.0)
MCV: 88.2 fL (ref 80.0–100.0)
Monocytes Absolute: 0.1 10*3/uL (ref 0.1–1.0)
Monocytes Relative: 2 %
Neutro Abs: 1.8 10*3/uL (ref 1.7–7.7)
Neutrophils Relative %: 57 %
Platelets: 122 10*3/uL — ABNORMAL LOW (ref 150–400)
RBC: 4.34 MIL/uL (ref 4.22–5.81)
RDW: 18.8 % — ABNORMAL HIGH (ref 11.5–15.5)
WBC: 3.2 10*3/uL — ABNORMAL LOW (ref 4.0–10.5)
nRBC: 0 % (ref 0.0–0.2)

## 2019-05-01 LAB — GLUCOSE, CAPILLARY
Glucose-Capillary: 100 mg/dL — ABNORMAL HIGH (ref 70–99)
Glucose-Capillary: 108 mg/dL — ABNORMAL HIGH (ref 70–99)
Glucose-Capillary: 123 mg/dL — ABNORMAL HIGH (ref 70–99)
Glucose-Capillary: 153 mg/dL — ABNORMAL HIGH (ref 70–99)
Glucose-Capillary: 158 mg/dL — ABNORMAL HIGH (ref 70–99)
Glucose-Capillary: 161 mg/dL — ABNORMAL HIGH (ref 70–99)
Glucose-Capillary: 162 mg/dL — ABNORMAL HIGH (ref 70–99)
Glucose-Capillary: 183 mg/dL — ABNORMAL HIGH (ref 70–99)

## 2019-05-01 LAB — PROCALCITONIN: Procalcitonin: <0.1 ng/mL

## 2019-05-01 LAB — CORTISOL: Cortisol, Plasma: 4.2 ug/dL

## 2019-05-01 LAB — LACTIC ACID, PLASMA: Lactic Acid, Venous: 1.8 mmol/L (ref 0.5–1.9)

## 2019-05-01 MED ORDER — JEVITY 1.5 CAL/FIBER PO LIQD
780.0000 mL | ORAL | Status: DC
  Administered 2019-05-01: 780 mL via ORAL
  Filled 2019-05-01 (×2): qty 1000
  Filled 2019-05-01: qty 948

## 2019-05-01 MED ORDER — LACTATED RINGERS IV BOLUS
1000.0000 mL | Freq: Once | INTRAVENOUS | Status: AC
  Administered 2019-05-01: 1000 mL via INTRAVENOUS

## 2019-05-01 NOTE — TOC Progression Note (Signed)
Transition of Care [facility]) - Progression Note    Patient Details  Name: Marc [name] MRN: [id number] Date of Birth: 1955/01/18  [facility] CM/SW Contact  [name], [place] Phone Number: 05/01/2019, 10:04 AM  Clinical Narrative:     CSW spoke with patient's son Marc [name] to inform him that per MD patient will be staying one more night inpatient with potential dc to [facility] tomorrow. CSW will continue to update son on discharge planning, son appreciative of information and reports he will be at hospital to see patient later today.   Expected Discharge Plan: Skilled Nursing Facility Barriers to Discharge: Continued Medical Work up  Expected Discharge Plan and Services Expected Discharge Plan: Skilled Nursing Facility In-house Referral: Clinical Social Work   Post Acute Care Choice: Skilled Nursing Facility Living arrangements for the past 2 months: Single Family Home                                       Social Determinants of Health (SDOH) Interventions    Readmission Risk Interventions No flowsheet data found.
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
## 2019-05-01 NOTE — Progress Notes (Signed)
Pt has a weak gurgling cough , secretions heard in chest , pt is unable to successfully cough up secretions stats dropping as pt coughs   MD notified  Robinul given  NT order  respiratory notified  Will continue to monitor

## 2019-05-01 NOTE — Significant Event (Signed)
Rapid Response Event Note  Overview: Unresponsiveness and hypotension  Initial Focused Assessment: I was notified by nursing of pt not responding to painful stimuli. Upon arrival, pt arousable to noxious stimuli and localizes to pain. CBG 153. BP 79/50, HR 104 ST, RR 22 with sats 97%. NS bolus 500cc initiated.   Interventions: -NS Bolus 500 cc  Plan of Care (if not transferred): - follow red MEWS guidelines -if pt not more responsive following bolus, recommend an ABG -Notify primary svc of events and further orders  Event Summary: Call received [phone] Arrived 2248 Call ended 2300  Rose Fillers

## 2019-05-01 NOTE — Progress Notes (Signed)
Nutrition Follow-up  DOCUMENTATION CODES:   Not applicable  INTERVENTION:   -Continue Magic cup TID with meals, each supplement provides 290 kcal and 9 grams of protein -Nocturnal feedings:   Jevity 1.5 @ 65 ml/hr via j-tube over 12 hours period (2000-0800)  D/c Prostat  Continue 300 ml free water flush every 4 hours per MD    Tube feeding regimen provides 1170 kcal (56% of needs), 50 grams of protein, and 593 ml of H2O. Total free water: 2393 ml free water daily  NUTRITION DIAGNOSIS:   Increased nutrient needs related to wound healing as evidenced by estimated needs.  Ongoing  GOAL:   Patient will meet greater than or equal to 90% of their needs  Progressing   MONITOR:   PO intake, Supplement acceptance, Weight trends, Labs, I & O's, Diet advancement, Skin, TF tolerance  REASON FOR ASSESSMENT:   Consult Enteral/tube feeding initiation and management  ASSESSMENT:   Patient with PMH significant for bipolar disorder, DM, HTN, schizophrenia, mitral valve MSSA endocarditis, s/p trach with eventually decannulation, and s/p PEG. Presents this admission with septic shock due to presumed urosepsis.  4/15- aspiration event, intubated  4/18- extubated   Reviewed I/O's: +4.3 L x 24 hours and +13 L since admission  Pt receiving nursing care at times of visits. Unable to speak with pt at this time.   Intake on dysphagia 1 diet on honey thick liquids has improved. Reviewed meal intake over the past 48 hours; pt consuming on average 1760 kcals and 73 grams protein, meeting 84% of estimated kcal needs and 70% of estimated protein needs.  Pt also receiving nocturnal feedings (Jevity 1.5 @ 65 ml/hr x 14 hours, 30 ml Prostat BID, and 300 ml free water flush every 4 hours): providing 1565 kcals, 88 grams protein, and 2491 free water, meeting 75% of estimated kcal needs and 85% of protein needs.   Due to increased nutritional needs and limited supplement options on restricted diet  (dysphagia 1 diet with honey thick liquids), RD will continue with nocturnal tube feedings.   Per [facility] team notes, likely discharge tomorrow (05/02/19) to SNF.   Labs reviewed: CBGS: 134-183.   Diet Order:   Diet Order            DIET - DYS 1 Room service appropriate? Yes; Fluid consistency: Honey Thick  Diet effective now              EDUCATION NEEDS:   Not appropriate for education at this time  Skin:  Skin Assessment: Skin Integrity Issues: Skin Integrity Issues:: Other (Comment) Stage I: coccyx Stage II: - Other: MASD to neck  Last BM:  04/30/19  Height:   Ht Readings from Last 1 Encounters:  04/22/19 6\' 2"  (1.88 m)    Weight:   Wt Readings from Last 1 Encounters:  05/01/19 90.1 kg   BMI:  Body mass index is 25.5 kg/m.  Estimated Nutritional Needs:   Kcal:  2100-2300 kcal  Protein:  105-125 grams  Fluid:  >/= 2.1 L/day    04/26/2019, RD, LDN, CDCES Registered Dietitian II Certified Diabetes Care and Education Specialist Please refer to [facility] for RD and/or RD on-call/weekend/after hours pager

## 2019-05-01 NOTE — Progress Notes (Signed)
RT NOTE: Pt has not been wearing bipap at night, machine not in room, pt comfortable at this time. RT will place pt on bipap if needed throughout night.

## 2019-05-01 NOTE — Progress Notes (Signed)
PROGRESS NOTE  Marc [name]  NLG:[id number] DOB: 02/23/1955 DOA: 04/22/2019 PCP: Patient, [name]   Brief Narrative: Marc [name] is a 64 y.o. male with a history of bipolar 1 disorder, schizophrenia,, T2DM, HTN, and prolonged admission for MSSA endocarditis (1/12 - 04/17/2019) requiring trach (decannulated) and PEG, ultimately discharged to SNF but returned to [facility] on 4/13 with septic shock due to UTI. He required pressors and intubation 4/14 for respiratory failure thought to be due to aspiration, eventually improving on antibiotics and extubated 4/18. He was transferred to PCU onto hospitalist service 4/20.  Assessment & Plan: Active Problems:   Hypotension   Sepsis secondary to UTI [facility])   Airway intubation performed without difficulty   Septic shock due to Enterococcus UTI: Blood cultures negative. Shock/sepsis have resolved.  - Completed cefepime per susceptibility data.  -Overnight patient hypothermic, white count downtrending, now minimally low at 3.2 -Repeat cultures overnight given concern for infection however procalcitonin remains negative which is reassuring and patient has had borderline low temperature over the past few days -Unclear if temperature is an autonomic issue or signs of recurrent infection, will monitor over the next 24 hours and follow temperature trend  RLL aspiration pneumonia, dysphagia, acute hypoxemic and hypercarbic respiratory failure:  - Extubated 4/18 - Wean oxygen as able SpO2: 100 % O2 Flow Rate (L/min): 2 L/min FiO2 (%): 40 % - Goals of care seem to be for aggressive measures. SLP reports significant improvement in exams since extubation, still on dysphagia 1, honey-thick liquids. Will continue to evaluate. - Aspiration precautions, pulmonary hygiene   Schizophrenia, bipolar disorder:  - Continue home seroquel, haldol, trazodone  Hypernatremia, ongoing:  - Continue free water per tube - increase per dietary  Hypoglycemia,  prediabetes: HbA1c 6.3%.  - Monitor per protocol - Continue tube feeds - should be fairly easy to control on PEG feeds  Hypochromic anemia, likely chronic anemia of chronic disease:  - Continue to follow with AM labs - stable  RN Pressure Injury Documentation: Pressure Injury 04/28/19 Coccyx Medial Stage 1 -  Intact skin with non-blanchable redness of a localized area usually over a bony prominence. (Active)  04/28/19 2000  Location: Coccyx  Location Orientation: Medial  Staging: Stage 1 -  Intact skin with non-blanchable redness of a localized area usually over a bony prominence.  Wound Description (Comments):   Present on Admission:     DVT prophylaxis: Heparin Code Status: Full Family Communication: None at bedside Disposition Plan:  Status is: Inpatient Remains inpatient appropriate because:Inpatient level of care appropriate due to severity of illness Dispo: The patient is from: SNF              Anticipated d/c is to: SNF              Anticipated d/c date is: 1 day              Patient currently is medically stable to d/c.  Consultants:   PCCM primary 4/13 - 4/19  Procedures:   ETT 4/14 - 4/18  CVL 4/13 - 4/18  Antimicrobials:  Vancomycin 4/13 - 4/15  Cefepime 4/13 - 4/20  (Urine culture 4/13 +Enterococcus)   Subjective: Patient notably hypothermic overnight, patient declines any symptoms but review of systems somewhat limited given his mental status.  Objective: Vitals:   05/01/19 0412 05/01/19 0413 05/01/19 0600 05/01/19 0654  BP:      Pulse: 62 60  79  Resp: 12 (!) 9  19  Temp:  [name])  90.4 F (32.4 C) (!) 89.3 F (31.8 C)   TempSrc:  Oral Oral   SpO2: 100% 100%    Weight:      Height:        Intake/Output Summary (Last 24 hours) at 05/01/2019 0700 Last data filed at 05/01/2019 0431 Gross per 24 hour  Intake 4290 ml  Output 1 ml  Net 4289 ml   Filed Weights   04/28/19 0500 04/29/19 0500 05/01/19 0037  Weight: 86.2 kg 86.6 kg 90.1 kg     Gen: Tall, chronically ill-appearing male in no distress and a bear hugger Pulm: Non-labored with consistent upper airway sounds that are not effectively cleared despite persistent coughing. Rhonchi most in R base. CV: Regular rate and rhythm. No murmur, rub, or gallop. No JVD. GI: Abdomen soft, non-tender, non-distended, with normoactive bowel sounds. No organomegaly or masses felt. Ext: Warm, no deformities. Woody trace edema in LE's. Skin: No rashes, lesions or ulcers. PEG site appears normal. Neuro: Alert and oriented. No focal neurological deficits.   Data Reviewed: I have personally reviewed following labs and imaging studies  CBC: Recent Labs  Lab 04/25/19 0452 04/26/19 0537 04/27/19 0340 04/28/19 0327 04/30/19 0427  WBC 9.6 5.3 4.8 4.5 5.2  HGB 9.4* 8.7* 9.0* 10.0* 10.8*  HCT 32.5* 29.0* 29.6* 34.3* 37.9*  MCV 88.3 84.1 84.3 87.1 88.3  PLT 141* 150 143* 135* 124*   Basic Metabolic Panel: Recent Labs  Lab 04/25/19 0452 04/26/19 0537 04/27/19 0340 04/28/19 0327 04/30/19 0427  NA 148* 148* 146* 147* 149*  K 4.1 3.9 3.9 4.4 3.8  CL 114* 117* 117* 113* 111  CO2 25 25 23 26 31   GLUCOSE 96 183* 79 86 90  BUN 27* 37* 35* 28* 31*  CREATININE 0.97 1.14 0.80 0.73 0.53*  CALCIUM 8.8* 8.3* 8.3* 8.4* 8.1*   GFR: Estimated Creatinine Clearance: 108.5 mL/min (A) (by C-G formula based on SCr of 0.53 mg/dL (L)). Liver Function Tests: No results for input(s): AST, ALT, ALKPHOS, BILITOT, PROT, ALBUMIN in the last 168 hours. No results for input(s): LIPASE, AMYLASE in the last 168 hours. No results for input(s): AMMONIA in the last 168 hours. Coagulation Profile: No results for input(s): INR, PROTIME in the last 168 hours. Cardiac Enzymes: No results for input(s): CKTOTAL, CKMB, CKMBINDEX, TROPONINI in the last 168 hours. BNP (last 3 results) No results for input(s): PROBNP in the last 8760 hours. HbA1C: No results for input(s): HGBA1C in the last 72  hours. CBG: Recent Labs  Lab 04/30/19 1609 04/30/19 2033 05/01/19 0017 05/01/19 0137 05/01/19 0409  GLUCAP 110* 134* 158* 161* 183*   Lipid Profile: No results for input(s): CHOL, HDL, LDLCALC, TRIG, CHOLHDL, LDLDIRECT in the last 72 hours. Thyroid Function Tests: No results for input(s): TSH, T4TOTAL, FREET4, T3FREE, THYROIDAB in the last 72 hours. Anemia Panel: Recent Labs    04/30/19 0427  FERRITIN 326  TIBC 151*  IRON 62   Urine analysis:    Component Value Date/Time   COLORURINE YELLOW 04/22/2019 1608   APPEARANCEUR TURBID (A) 04/22/2019 1608   LABSPEC 1.024 04/22/2019 1608   PHURINE 5.0 04/22/2019 1608   GLUCOSEU NEGATIVE 04/22/2019 1608   HGBUR SMALL (A) 04/22/2019 1608   BILIRUBINUR NEGATIVE 04/22/2019 1608   KETONESUR NEGATIVE 04/22/2019 1608   PROTEINUR 100 (A) 04/22/2019 1608   UROBILINOGEN 1.0 05/08/2013 2201   NITRITE NEGATIVE 04/22/2019 1608   LEUKOCYTESUR SMALL (A) 04/22/2019 1608   Recent Results (from the past 240 hour(s))  Urine  culture     Status: Abnormal   Collection Time: 04/22/19  4:08 PM   Specimen: In/Out Cath Urine  Result Value Ref Range Status   Specimen Description   Final    IN/OUT CATH URINE Performed at [facility], [address] [place], [place] [address]    Special Requests   Final    NONE Performed at [facility], [address] [place], [place] [address]    Culture >=100,000 COLONIES/mL ENTEROBACTER CLOACAE (A)  Final   Report Status 04/25/2019 FINAL  Final   Organism ID, Bacteria ENTEROBACTER CLOACAE (A)  Final      Susceptibility   Enterobacter cloacae - MIC*    CEFAZOLIN >=64 RESISTANT Resistant     CIPROFLOXACIN <=0.25 SENSITIVE Sensitive     GENTAMICIN <=1 SENSITIVE Sensitive     IMIPENEM 1 SENSITIVE Sensitive     NITROFURANTOIN 32 SENSITIVE Sensitive     TRIMETH/SULFA <=20 SENSITIVE Sensitive     * >=100,000 COLONIES/mL ENTEROBACTER CLOACAE  Blood Culture (routine x 2)     Status: None   Collection Time:  04/22/19  4:42 PM   Specimen: BLOOD RIGHT HAND  Result Value Ref Range Status   Specimen Description BLOOD RIGHT HAND  Final   Special Requests   Final    BOTTLES DRAWN AEROBIC AND ANAEROBIC Blood Culture adequate volume   Culture   Final    NO GROWTH 5 DAYS Performed at [facility], [address] [place], [place] [address]    Report Status 04/27/2019 FINAL  Final  Blood Culture (routine x 2)     Status: None   Collection Time: 04/22/19  4:42 PM   Specimen: BLOOD RIGHT FOREARM  Result Value Ref Range Status   Specimen Description BLOOD RIGHT FOREARM  Final   Special Requests   Final    BOTTLES DRAWN AEROBIC AND ANAEROBIC Blood Culture adequate volume   Culture   Final    NO GROWTH 5 DAYS Performed at [facility], [address] [place], [place] [address]    Report Status 04/27/2019 FINAL  Final  Respiratory Panel by RT PCR (Flu A&B, Covid) - Nasopharyngeal Swab     Status: None   Collection Time: 04/22/19  5:24 PM   Specimen: Nasopharyngeal Swab  Result Value Ref Range Status   SARS Coronavirus 2 by RT PCR NEGATIVE NEGATIVE Final    Comment: (NOTE) SARS-CoV-2 target nucleic acids are NOT DETECTED. The SARS-CoV-2 RNA is generally detectable in upper respiratoy specimens during the acute phase of infection. The lowest concentration of SARS-CoV-2 viral copies this assay can detect is 131 copies/mL. A negative result does not preclude SARS-Cov-2 infection and should not be used as the sole basis for treatment or other patient management decisions. A negative result may occur with  improper specimen collection/handling, submission of specimen other than nasopharyngeal swab, presence of viral mutation(s) within the areas targeted by this assay, and inadequate number of viral copies (<131 copies/mL). A negative result must be combined with clinical observations, patient history, and epidemiological information. The expected result is Negative. Fact Sheet for Patients:   https://www.moore.com/https://www.fda.gov/media/142436/download Fact Sheet for Healthcare Providers:  https://www.young.biz/https://www.fda.gov/media/142435/download This test is not yet ap proved or cleared by the [place] FDA and  has been authorized for detection and/or diagnosis of SARS-CoV-2 by FDA under an Emergency Use Authorization (EUA). This EUA will remain  in effect (meaning this test can be used) for the duration of the COVID-19 declaration under Section 564(b)(1) of the Act,  21 U.S.C. section 360bbb-3(b)(1), unless the authorization is terminated or revoked sooner.    Influenza A by PCR NEGATIVE NEGATIVE Final   Influenza B by PCR NEGATIVE NEGATIVE Final    Comment: (NOTE) The Xpert Xpress SARS-CoV-2/FLU/RSV assay is intended as an aid in  the diagnosis of influenza from Nasopharyngeal swab specimens and  should not be used as a sole basis for treatment. Nasal washings and  aspirates are unacceptable for Xpert Xpress SARS-CoV-2/FLU/RSV  testing. Fact Sheet for Patients: PinkCheek.be Fact Sheet for Healthcare Providers: GravelBags.it This test is not yet approved or cleared by the [place] FDA and  has been authorized for detection and/or diagnosis of SARS-CoV-2 by  FDA under an Emergency Use Authorization (EUA). This EUA will remain  in effect (meaning this test can be used) for the duration of the  Covid-19 declaration under Section 564(b)(1) of the Act, 21  U.S.C. section 360bbb-3(b)(1), unless the authorization is  terminated or revoked. Performed at [facility], [address], [place], [place] [address]   SARS CORONAVIRUS 2 (TAT 6-24 HRS) Nasopharyngeal Nasopharyngeal Swab     Status: None   Collection Time: 04/30/19  1:26 PM   Specimen: Nasopharyngeal Swab  Result Value Ref Range Status   SARS Coronavirus 2 NEGATIVE NEGATIVE Final    Comment: (NOTE) SARS-CoV-2 target nucleic acids are NOT DETECTED. The SARS-CoV-2 RNA is generally  detectable in upper and lower respiratory specimens during the acute phase of infection. Negative results do not preclude SARS-CoV-2 infection, do not rule out co-infections with other pathogens, and should not be used as the sole basis for treatment or other patient management decisions. Negative results must be combined with clinical observations, patient history, and epidemiological information. The expected result is Negative. Fact Sheet for Patients: SugarRoll.be Fact Sheet for Healthcare Providers: https://www.woods-mathews.com/ This test is not yet approved or cleared by the [place] FDA and  has been authorized for detection and/or diagnosis of SARS-CoV-2 by FDA under an Emergency Use Authorization (EUA). This EUA will remain  in effect (meaning this test can be used) for the duration of the COVID-19 declaration under Section 56 4(b)(1) of the Act, 21 U.S.C. section 360bbb-3(b)(1), unless the authorization is terminated or revoked sooner. Performed at [facility], [place] [address] [place], [place] [address]       Radiology Studies: No results found.  Scheduled Meds: . docusate  100 mg Per Tube BID  . feeding supplement (JEVITY 1.5 CAL/FIBER)  910 mL Oral Q24H  . feeding supplement (PRO-STAT SUGAR FREE 64)  30 mL Per Tube BID  . free water  300 mL Per Tube Q4H  . glycopyrrolate  1 mg Per Tube TID  . haloperidol  1 mg Per Tube BID  . heparin  5,000 Units Subcutaneous Q8H  . midodrine  5 mg Per Tube TID WC  . polyethylene glycol  17 g Oral Daily  . predniSONE  10 mg Oral 3 x daily with food  . [START ON 05/02/2019] predniSONE  10 mg Oral 4X daily taper  . predniSONE  20 mg Oral AC breakfast  . predniSONE  20 mg Oral Nightly  . QUEtiapine  25 mg Per Tube QHS  . sodium chloride flush  10-40 mL Intracatheter Q12H  . traZODone  50 mg Per Tube QHS   Continuous Infusions: . sodium chloride Stopped (04/26/19 1359)  .  famotidine (PEPCID) IV 20 mg (04/30/19 2015)     LOS: 9 days   Time spent: 25  minutes.  Azucena Fallen, DO [facility] [email or web address] 05/01/2019, 7:00 AM

## 2019-05-01 NOTE — Progress Notes (Addendum)
Pts temp is 90.4  Pt is cold to the touch despite room temp and blankets pt is alert and oriented  Follows commands BP 122/89   Pulse 60   Temp (!) 90.4 F (32.4 C) (Oral)   Resp (!) 9   Ht 6\' 2"  (1.88 m)   Wt 90.1 kg   SpO2 100%   BMI 25.50 kg/m   Warm blankets applied as well as heat packs.  MD notified  Will continue to monitor  615 bear hugger placed will reassess

## 2019-05-01 NOTE — Progress Notes (Signed)
   05/01/19 0800  Assess: MEWS Score  Temp (!) 91 F (32.8 C)  BP 108/63  Pulse Rate 78  ECG Heart Rate 82  Resp 13  SpO2 99 %  O2 Device Room Air  Patient Activity (if Appropriate) In bed  Assess: MEWS Score  MEWS Temp 2  MEWS Systolic 0  MEWS Pulse 0  MEWS RR 1  MEWS LOC 0  MEWS Score 3  MEWS Score Color Yellow   - Not an acute change, bair hugger on - Patient has been hypothermic due to septic shock; Primary team aware and pt. Being treated for sepsis

## 2019-05-01 NOTE — Progress Notes (Addendum)
   05/01/19 2220  Assess: MEWS Score  BP (!) 79/50  Pulse Rate (!) 103  ECG Heart Rate (!) 104  Resp (!) 22  Level of Consciousness Responds to Voice (drowsy )  SpO2 97 %  O2 Device Nasal Cannula  O2 Flow Rate (L/min) 4 L/min  Assess: MEWS Score  MEWS Temp 0  MEWS Systolic 2  MEWS Pulse 1  MEWS RR 1  MEWS LOC 1  MEWS Score 5  MEWS Score Color Red  Assess: if the MEWS score is Yellow or Red  MEWS guidelines implemented *See Row Information* Yes  Notify: Charge Nurse/RN  Name of Charge Nurse/RN Notified erica  Date Charge Nurse/RN Notified 05/01/19  Time Charge Nurse/RN Notified 2215  Notify: Provider  Provider Name/Title denny   Date Provider Notified 05/01/19  Time Provider Notified 2220  Notification Type Page  Notification Reason Change in status  Response See new orders  Date of Provider Response 05/01/19  Time of Provider Response 2230  Notify: Rapid Response  Name of Rapid Response RN Notified david  Date Rapid Response Notified 05/01/19  Time Rapid Response Notified 2220  Document  Patient Outcome Stabilized after interventions  Progress note created (see row info) Yes   Pt has change in vitals sighs and is also much more drowsy Rapid notified   MD notified  LR bolus initiated  Will continue to monitor  Will continue to monitor

## 2019-05-02 DIAGNOSIS — A419 Sepsis, unspecified organism: Secondary | ICD-10-CM

## 2019-05-02 DIAGNOSIS — J690 Pneumonitis due to inhalation of food and vomit: Secondary | ICD-10-CM

## 2019-05-02 DIAGNOSIS — J9601 Acute respiratory failure with hypoxia: Secondary | ICD-10-CM

## 2019-05-02 DIAGNOSIS — R6521 Severe sepsis with septic shock: Secondary | ICD-10-CM

## 2019-05-02 DIAGNOSIS — J9602 Acute respiratory failure with hypercapnia: Secondary | ICD-10-CM

## 2019-05-02 DIAGNOSIS — F319 Bipolar disorder, unspecified: Secondary | ICD-10-CM

## 2019-05-02 DIAGNOSIS — R4182 Altered mental status, unspecified: Secondary | ICD-10-CM

## 2019-05-02 DIAGNOSIS — F200 Paranoid schizophrenia: Secondary | ICD-10-CM

## 2019-05-02 LAB — BASIC METABOLIC PANEL
Anion gap: 6 (ref 5–15)
BUN: 34 mg/dL — ABNORMAL HIGH (ref 8–23)
CO2: 33 mmol/L — ABNORMAL HIGH (ref 22–32)
Calcium: 8 mg/dL — ABNORMAL LOW (ref 8.9–10.3)
Chloride: 109 mmol/L (ref 98–111)
Creatinine, Ser: 0.71 mg/dL (ref 0.61–1.24)
GFR calc Af Amer: >60 mL/min (ref >60)
GFR calc non Af Amer: >60 mL/min (ref >60)
Glucose, Bld: 181 mg/dL — ABNORMAL HIGH (ref 70–99)
Potassium: 4.9 mmol/L (ref 3.5–5.1)
Sodium: 148 mmol/L — ABNORMAL HIGH (ref 135–145)

## 2019-05-02 LAB — GLUCOSE, CAPILLARY
Glucose-Capillary: 131 mg/dL — ABNORMAL HIGH (ref 70–99)
Glucose-Capillary: 142 mg/dL — ABNORMAL HIGH (ref 70–99)
Glucose-Capillary: 148 mg/dL — ABNORMAL HIGH (ref 70–99)
Glucose-Capillary: 180 mg/dL — ABNORMAL HIGH (ref 70–99)

## 2019-05-02 LAB — CBC
HCT: 34.6 % — ABNORMAL LOW (ref 39.0–52.0)
Hemoglobin: 9.8 g/dL — ABNORMAL LOW (ref 13.0–17.0)
MCH: 25.2 pg — ABNORMAL LOW (ref 26.0–34.0)
MCHC: 28.3 g/dL — ABNORMAL LOW (ref 30.0–36.0)
MCV: 88.9 fL (ref 80.0–100.0)
Platelets: 149 10*3/uL — ABNORMAL LOW (ref 150–400)
RBC: 3.89 MIL/uL — ABNORMAL LOW (ref 4.22–5.81)
RDW: 19.2 % — ABNORMAL HIGH (ref 11.5–15.5)
WBC: 10 10*3/uL (ref 4.0–10.5)
nRBC: 0.2 % (ref 0.0–0.2)

## 2019-05-02 MED ORDER — GLYCOPYRROLATE 1 MG PO TABS: 1.0000 mg | ORAL_TABLET | Freq: Three times a day (TID) | ORAL | 0 refills | Status: AC

## 2019-05-02 MED ORDER — MIDODRINE HCL 5 MG PO TABS: 5.0000 mg | ORAL_TABLET | Freq: Three times a day (TID) | ORAL | 0 refills | Status: AC

## 2019-05-02 MED ORDER — JEVITY 1.5 CAL/FIBER PO LIQD: 780.0000 mL | ORAL | 0 refills | Status: AC

## 2019-05-02 MED ORDER — FREE WATER: 300.0000 mL | 0 refills | Status: AC

## 2019-05-02 MED ORDER — PREDNISONE 10 MG PO TABS: ORAL_TABLET | ORAL | 0 refills | Status: AC

## 2019-05-02 MED ORDER — HYDROXYZINE HCL 10 MG PO TABS: 10.0000 mg | ORAL_TABLET | Freq: Three times a day (TID) | ORAL | 0 refills | Status: AC | PRN

## 2019-05-02 MED ORDER — SCOPOLAMINE 1 MG/3DAYS TD PT72
1.0000 | MEDICATED_PATCH | TRANSDERMAL | Status: DC
  Administered 2019-05-02: 1.5 mg via TRANSDERMAL
  Filled 2019-05-02: qty 1

## 2019-05-02 NOTE — TOC Transition Note (Signed)
Transition of Care [facility]) - CM/SW Discharge Note   Patient Details  Name: Marc [name] MRN: [id number] Date of Birth: 03-31-1955  Transition of Care [facility]) CM/SW Contact:  [name], LCSW Phone Number: 05/02/2019, 12:56 PM   Clinical Narrative:     Patient will DC to: [name] Anticipated DC date: 05/02/19 Family notified: son [name] Transport WC:BJSE  Per MD patient ready for DC to [name]. RN, patient, patient's family, and facility notified of DC. Discharge Summary sent to facility. RN given number for report   [phone] Room 105P. DC packet on chart. Ambulance transport requested for patient.  CSW signing off.  [name], [name]   Final next level of care: Skilled Nursing Facility Barriers to Discharge: No Barriers Identified   Patient Goals and CMS Choice Patient states their goals for this hospitalization and ongoing recovery are:: to go to rehab CMS Medicare.gov Compare Post Acute Care list provided to:: Patient Choice offered to / list presented to : Patient  Discharge Placement PASRR number recieved: 04/28/19            Patient [facility] bed at: [name] Patient to be transferred to facility by: [name] Name of family member notified: [name] (son) Patient and family notified of of transfer: 05/02/19  Discharge Plan and Services In-house Referral: Clinical Social Work   Post Acute Care Choice: Skilled Nursing Facility                               Social Determinants of Health (SDOH) Interventions     Readmission Risk Interventions No flowsheet data found.

## 2019-05-02 NOTE — TOC Progression Note (Signed)
Transition of Care [facility]) - Progression Note    Patient Details  Name: Marc [name] MRN: [id number] Date of Birth: Feb 23, 1955  [facility]) CM/SW Contact  [name], [place] Phone Number: 05/02/2019, 10:28 AM  Clinical Narrative:     [facility] team continues to follow for discharge planning, patient has bed at [facility] once medically stable.   Expected Discharge Plan: Skilled Nursing Facility Barriers to Discharge: Continued Medical Work up  Expected Discharge Plan and Services Expected Discharge Plan: Skilled Nursing Facility In-house Referral: Clinical Social Work   Post Acute Care Choice: Skilled Nursing Facility Living arrangements for the past 2 months: Single Family Home                                       Social Determinants of Health (SDOH) Interventions    Readmission Risk Interventions No flowsheet data found.

## 2019-05-02 NOTE — Progress Notes (Addendum)
BP (!) 93/57 (BP Location: Left Arm)   Pulse 87   Temp (!) 93.7 F (34.3 C) (Oral)   Resp 14   Ht 6\' 2"  (1.88 m)   Wt 92.1 kg   SpO2 99%   BMI 26.07 kg/m   MD notified of blood pressure  Heating blanket reapplied  Will continue to monitor

## 2019-05-02 NOTE — Plan of Care (Signed)

## 2019-05-02 NOTE — Progress Notes (Signed)
Called report and given to [facility], all questions answered. D/C packet together w/ Prednisone tablets & benadryl cream will be sent w/ the patient.

## 2019-05-02 NOTE — Discharge Summary (Addendum)
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 manipulation;Reduced posterior propulsion;Delayed oral transit;Decreased bolus cohesion;Lingual/palatal residue Oral - Mech Soft NT Oral - Regular NT Oral - Multi-Consistency -- Oral - Pill -- Oral Phase - Comment --  CHL IP PHARYNGEAL PHASE 04/16/2019 Pharyngeal Phase Impaired Pharyngeal- Pudding Teaspoon -- Pharyngeal -- Pharyngeal- Pudding Cup -- Pharyngeal -- Pharyngeal- Honey Teaspoon NT Pharyngeal -- Pharyngeal- Honey Cup Delayed swallow initiation-pyriform sinuses Pharyngeal -- Pharyngeal- Nectar Teaspoon Delayed swallow initiation-vallecula Pharyngeal Material does not enter airway Pharyngeal- Nectar Cup Delayed swallow initiation-pyriform sinuses Pharyngeal Material does not enter airway Pharyngeal- Nectar Straw Delayed swallow initiation-pyriform sinuses Pharyngeal Material does not enter airway Pharyngeal- Thin Teaspoon Delayed swallow initiation-pyriform sinuses;Penetration/Aspiration before swallow Pharyngeal Material enters airway, passes BELOW cords without attempt by patient to eject out (silent aspiration) Pharyngeal- Thin Cup NT Pharyngeal -- Pharyngeal- Thin Straw -- Pharyngeal -- Pharyngeal- Puree Delayed swallow initiation-pyriform sinuses Pharyngeal -- Pharyngeal- Mechanical Soft NT Pharyngeal --  Pharyngeal- Regular NT Pharyngeal -- Pharyngeal- Multi-consistency -- Pharyngeal -- Pharyngeal- Pill -- Pharyngeal -- Pharyngeal Comment --  CHL IP CERVICAL ESOPHAGEAL PHASE 04/16/2019 Cervical Esophageal Phase WFL Pudding Teaspoon -- Pudding Cup -- Honey Teaspoon -- Honey Cup -- Nectar Teaspoon -- Nectar Cup -- Nectar Straw -- Thin Teaspoon -- Thin Cup -- Thin Straw -- Puree -- Mechanical Soft -- Regular -- Multi-consistency -- Pill -- Cervical Esophageal Comment -- Mahala Menghini., M.A. CCC-SLP Acute Rehabilitation Services Pager [phone] Office [phone] 04/16/2019, 4:08 PM              ECHOCARDIOGRAM COMPLETE  Result Date: 04/23/2019    ECHOCARDIOGRAM REPORT   Patient Name:   Marc [name] Date of Exam: 04/23/2019 Medical Rec #:  [id number]         Height:       74.0 in Accession #:    [id number]        Weight:       185.2 lb Date of Birth:  November 18, 1955         BSA:          2.104 m Patient Age:    63 years          BP:           101/56 mmHg Patient Gender: M                 HR:           82 bpm. Exam Location:  Inpatient Procedure: 2D Echo, Cardiac Doppler and Color Doppler Indications:    Shock  History:        Patient has prior history of Echocardiogram examinations, most                 recent 01/30/2019. Signs/Symptoms:Altered Mental Status; Risk                 Factors:Diabetes and Hypertension. Septic shock, Resp. failure.  Sonographer:    Lavenia Atlas Referring Phys: [phone] Clarene Critchley HOFFMAN IMPRESSIONS  1. Left ventricular ejection fraction, by estimation, is 55 to 60%. The left ventricle has normal function. The left ventricle has no regional wall motion abnormalities. Left ventricular diastolic parameters are consistent with Grade I diastolic dysfunction (impaired relaxation).  2. Right ventricular systolic function is  normal. The right ventricular size is normal. There is moderately elevated pulmonary artery systolic pressure.  3. The mitral valve is normal in structure. No evidence of mitral valve  regurgitation. No evidence of mitral stenosis.  4. The aortic valve is normal in structure. Aortic valve regurgitation is not visualized. No aortic stenosis is present. FINDINGS  Left Ventricle: Left ventricular ejection fraction, by estimation, is 55 to 60%. The left ventricle has normal function. The left ventricle has no regional wall motion abnormalities. The left ventricular internal cavity size was normal in size. There is  no left ventricular hypertrophy. Left ventricular diastolic parameters are consistent with Grade I diastolic dysfunction (impaired relaxation). Right Ventricle: The right ventricular size is normal. No increase in right ventricular wall thickness. Right ventricular systolic function is normal. There is moderately elevated pulmonary artery systolic pressure. The tricuspid regurgitant velocity is 3.03 m/s, and with an assumed right atrial pressure of 8 mmHg, the estimated right ventricular systolic pressure is 44.7 mmHg. Left Atrium: Left atrial size was normal in size. Right Atrium: Right atrial size was normal in size. Pericardium: There is no evidence of pericardial effusion. Mitral Valve: The mitral valve is normal in structure. No evidence of mitral valve regurgitation. No evidence of mitral valve stenosis. Tricuspid Valve: The tricuspid valve is normal in structure. Tricuspid valve regurgitation is trivial. Aortic Valve: The aortic valve is normal in structure. Aortic valve regurgitation is not visualized. No aortic stenosis is present. Pulmonic Valve: The pulmonic valve was normal in structure. Pulmonic valve regurgitation is not visualized. No evidence of pulmonic stenosis. Aorta: The aortic root and ascending aorta are structurally normal, with no evidence of dilitation. IAS/Shunts: The atrial septum is grossly normal.  LEFT VENTRICLE PLAX 2D LVIDd:         4.68 cm  Diastology LVIDs:         3.56 cm  LV e' lateral:   5.98 cm/s LV PW:         1.07 cm  LV E/e' lateral: 13.3 LV IVS:         1.00 cm  LV e' medial:    6.53 cm/s LVOT diam:     2.20 cm  LV E/e' medial:  12.2 LV SV:         68 LV SV Index:   33 LVOT Area:     3.80 cm  RIGHT VENTRICLE RV Basal diam:  2.56 cm RV S prime:     13.10 cm/s TAPSE (M-mode): 2.9 cm LEFT ATRIUM           Index       RIGHT ATRIUM           Index LA diam:      3.40 cm 1.62 cm/m  RA Area:     10.70 cm LA Vol (A2C): 34.5 ml 16.40 ml/m RA Volume:   22.20 ml  10.55 ml/m  AORTIC VALVE LVOT Vmax:   91.60 cm/s LVOT Vmean:  57.800 cm/s LVOT VTI:    0.180 m  AORTA Ao Root diam: 3.50 cm MITRAL VALVE                TRICUSPID VALVE MV Area (PHT): 8.62 cm     TR Peak grad:   36.7 mmHg MV Decel Time: 88 msec      TR Vmax:        303.00 cm/s MV E velocity: 79.50 cm/s MV A velocity: 101.00 cm/s  SHUNTS MV E/A ratio:  0.79  Systemic VTI:  0.18 m                             Systemic Diam: 2.20 cm Kristeen Miss MD Electronically signed by Kristeen Miss MD Signature Date/Time: 04/23/2019/2:31:59 PM    Final     Subjective: Patient continues to have low-grade hypothermia overnight without acute event or issues.  Patient otherwise has somewhat limited review of systems due to mental status but denies headache, fevers, cough, diarrhea, constipation, pain.   Discharge Exam: Vitals:   05/02/19 0952 05/02/19 1000  BP: (!) 105/50 101/62  Pulse: 88 76  Resp: (!) 23 16  Temp:    SpO2: 97% 98%   Vitals:   05/02/19 0904 05/02/19 0925 05/02/19 0952 05/02/19 1000  BP:   (!) 105/50 101/62  Pulse: 89 83 88 76  Resp: 15 16 (!) 23 16  Temp:      TempSrc:      SpO2: 99% 99% 97% 98%  Weight:      Height:        Gen: Tall, chronically ill-appearing male in no distress and a bear hugger Pulm: Scant bilateral rhonchi like due to upper airway, without rales CV: Regular rate and rhythm. No murmur, rub, or gallop. No JVD. GI: Abdomen soft, non-tender, non-distended, with normoactive bowel sounds. No organomegaly or masses felt. Peg site clean/dry/intact Ext: Warm, no  deformities. Woody trace edema in LE's. Skin: No rashes, lesions, stage 1 sacral skin changes. Neuro: Alert and oriented to person without focal deficit.   The results of significant diagnostics from this hospitalization (including imaging, microbiology, ancillary and laboratory) are listed below for reference.     Microbiology: Recent Results (from the past 240 hour(s))  Urine culture     Status: Abnormal   Collection Time: 04/22/19  4:08 PM   Specimen: In/Out Cath Urine  Result Value Ref Range Status   Specimen Description   Final    IN/OUT CATH URINE Performed at [facility], [address] [place], [place] [address]    Special Requests   Final    NONE Performed at [facility], [address], [place], [place] [address]    Culture >=100,000 COLONIES/mL ENTEROBACTER CLOACAE (A)  Final   Report Status 04/25/2019 FINAL  Final   Organism ID, Bacteria ENTEROBACTER CLOACAE (A)  Final      Susceptibility   Enterobacter cloacae - MIC*    CEFAZOLIN >=64 RESISTANT Resistant     CIPROFLOXACIN <=0.25 SENSITIVE Sensitive     GENTAMICIN <=1 SENSITIVE Sensitive     IMIPENEM 1 SENSITIVE Sensitive     NITROFURANTOIN 32 SENSITIVE Sensitive     TRIMETH/SULFA <=20 SENSITIVE Sensitive     * >=100,000 COLONIES/mL ENTEROBACTER CLOACAE  Blood Culture (routine x 2)     Status: None   Collection Time: 04/22/19  4:42 PM   Specimen: BLOOD RIGHT HAND  Result Value Ref Range Status   Specimen Description BLOOD RIGHT HAND  Final   Special Requests   Final    BOTTLES DRAWN AEROBIC AND ANAEROBIC Blood Culture adequate volume   Culture   Final    NO GROWTH 5 DAYS Performed at [facility], [address], [place], [place] [address]    Report Status 04/27/2019 FINAL  Final  Blood Culture (routine x 2)     Status: None   Collection Time: 04/22/19  4:42 PM   Specimen: BLOOD RIGHT FOREARM  Result Value Ref  Range Status   Specimen Description BLOOD RIGHT FOREARM  Final   Special Requests   Final     BOTTLES DRAWN AEROBIC AND ANAEROBIC Blood Culture adequate volume   Culture   Final    NO GROWTH 5 DAYS Performed at [facility], [address] [place], [place] [address]    Report Status 04/27/2019 FINAL  Final  Respiratory Panel by RT PCR (Flu A&B, Covid) - Nasopharyngeal Swab     Status: None   Collection Time: 04/22/19  5:24 PM   Specimen: Nasopharyngeal Swab  Result Value Ref Range Status   SARS Coronavirus 2 by RT PCR NEGATIVE NEGATIVE Final    Comment: (NOTE) SARS-CoV-2 target nucleic acids are NOT DETECTED. The SARS-CoV-2 RNA is generally detectable in upper respiratoy specimens during the acute phase of infection. The lowest concentration of SARS-CoV-2 viral copies this assay can detect is 131 copies/mL. A negative result does not preclude SARS-Cov-2 infection and should not be used as the sole basis for treatment or other patient management decisions. A negative result may occur with  improper specimen collection/handling, submission of specimen other than nasopharyngeal swab, presence of viral mutation(s) within the areas targeted by this assay, and inadequate number of viral copies (<131 copies/mL). A negative result must be combined with clinical observations, patient history, and epidemiological information. The expected result is Negative. Fact Sheet for Patients:  https://www.moore.com/ Fact Sheet for Healthcare Providers:  https://www.young.biz/ This test is not yet ap proved or cleared by the [place] FDA and  has been authorized for detection and/or diagnosis of SARS-CoV-2 by FDA under an Emergency Use Authorization (EUA). This EUA will remain  in effect (meaning this test can be used) for the duration of the COVID-19 declaration under Section 564(b)(1) of the Act, 21 U.S.C. section 360bbb-3(b)(1), unless the authorization is terminated or revoked sooner.    Influenza A by PCR NEGATIVE NEGATIVE Final   Influenza B  by PCR NEGATIVE NEGATIVE Final    Comment: (NOTE) The Xpert Xpress SARS-CoV-2/FLU/RSV assay is intended as an aid in  the diagnosis of influenza from Nasopharyngeal swab specimens and  should not be used as a sole basis for treatment. Nasal washings and  aspirates are unacceptable for Xpert Xpress SARS-CoV-2/FLU/RSV  testing. Fact Sheet for Patients: https://www.moore.com/ Fact Sheet for Healthcare Providers: https://www.young.biz/ This test is not yet approved or cleared by the [place] FDA and  has been authorized for detection and/or diagnosis of SARS-CoV-2 by  FDA under an Emergency Use Authorization (EUA). This EUA will remain  in effect (meaning this test can be used) for the duration of the  Covid-19 declaration under Section 564(b)(1) of the Act, 21  U.S.C. section 360bbb-3(b)(1), unless the authorization is  terminated or revoked. Performed at [facility], [address], [place], [place] [address]   SARS CORONAVIRUS 2 (TAT 6-24 HRS) Nasopharyngeal Nasopharyngeal Swab     Status: None   Collection Time: 04/30/19  1:26 PM   Specimen: Nasopharyngeal Swab  Result Value Ref Range Status   SARS Coronavirus 2 NEGATIVE NEGATIVE Final    Comment: (NOTE) SARS-CoV-2 target nucleic acids are NOT DETECTED. The SARS-CoV-2 RNA is generally detectable in upper and lower respiratory specimens during the acute phase of infection. Negative results do not preclude SARS-CoV-2 infection, do not rule out co-infections with other pathogens, and should not be used as the sole basis for treatment or other patient management decisions. Negative results must be combined with clinical observations, patient history, and epidemiological information.  The expected result is Negative. Fact Sheet for Patients: HairSlick.no Fact Sheet for Healthcare Providers: quierodirigir.com This test is not yet approved  or cleared by the [place] FDA and  has been authorized for detection and/or diagnosis of SARS-CoV-2 by FDA under an Emergency Use Authorization (EUA). This EUA will remain  in effect (meaning this test can be used) for the duration of the COVID-19 declaration under Section 56 4(b)(1) of the Act, 21 U.S.C. section 360bbb-3(b)(1), unless the authorization is terminated or revoked sooner. Performed at [facility], [address] [place], [place] [address]      Labs: BNP (last 3 results) No results for input(s): BNP in the last 8760 hours. Basic Metabolic Panel: Recent Labs  Lab 04/27/19 0340 04/28/19 0327 04/30/19 0427 05/01/19 0638 05/02/19 0733  NA 146* 147* 149* 149* 148*  K 3.9 4.4 3.8 4.8 4.9  CL 117* 113* 111 112* 109  CO2 23 26 31 31  33*  GLUCOSE 79 86 90 157* 181*  BUN 35* 28* 31* 29* 34*  CREATININE 0.80 0.73 0.53* 0.55* 0.71  CALCIUM 8.3* 8.4* 8.1* 8.4* 8.0*   Liver Function Tests: Recent Labs  Lab 05/01/19 0638  AST 28  ALT 25  ALKPHOS 82  BILITOT 0.1*  PROT 5.3*  ALBUMIN 2.5*   No results for input(s): LIPASE, AMYLASE in the last 168 hours. No results for input(s): AMMONIA in the last 168 hours. CBC: Recent Labs  Lab 04/27/19 0340 04/28/19 0327 04/30/19 0427 05/01/19 0638 05/02/19 0733  WBC 4.8 4.5 5.2 3.2* 10.0  NEUTROABS  --   --   --  1.8  --   HGB 9.0* 10.0* 10.8* 11.0* 9.8*  HCT 29.6* 34.3* 37.9* 38.3* 34.6*  MCV 84.3 87.1 88.3 88.2 88.9  PLT 143* 135* 124* 122* 149*   Cardiac Enzymes: No results for input(s): CKTOTAL, CKMB, CKMBINDEX, TROPONINI in the last 168 hours. BNP: Invalid input(s): POCBNP CBG: Recent Labs  Lab 05/01/19 2012 05/01/19 2247 05/02/19 0006 05/02/19 0425 05/02/19 0755  GLUCAP 123* 153* 131* 180* 148*   D-Dimer No results for input(s): DDIMER in the last 72 hours. Hgb A1c No results for input(s): HGBA1C in the last 72 hours. Lipid Profile No results for input(s): CHOL, HDL, LDLCALC, TRIG,  CHOLHDL, LDLDIRECT in the last 72 hours. Thyroid function studies No results for input(s): TSH, T4TOTAL, T3FREE, THYROIDAB in the last 72 hours.  Invalid input(s): FREET3 Anemia work up Recent Labs    04/30/19 0427  FERRITIN 326  TIBC 151*  IRON 62   Urinalysis    Component Value Date/Time   COLORURINE YELLOW 04/22/2019 1608   APPEARANCEUR TURBID (A) 04/22/2019 1608   LABSPEC 1.024 04/22/2019 1608   PHURINE 5.0 04/22/2019 1608   GLUCOSEU NEGATIVE 04/22/2019 1608   HGBUR SMALL (A) 04/22/2019 1608   BILIRUBINUR NEGATIVE 04/22/2019 1608   KETONESUR NEGATIVE 04/22/2019 1608   PROTEINUR 100 (A) 04/22/2019 1608   UROBILINOGEN 1.0 05/08/2013 2201   NITRITE NEGATIVE 04/22/2019 1608   LEUKOCYTESUR SMALL (A) 04/22/2019 1608   Sepsis Labs Invalid input(s): PROCALCITONIN,  WBC,  LACTICIDVEN Microbiology Recent Results (from the past 240 hour(s))  Urine culture     Status: Abnormal   Collection Time: 04/22/19  4:08 PM   Specimen: In/Out Cath Urine  Result Value Ref Range Status   Specimen Description   Final    IN/OUT CATH URINE Performed at [facility], [address], [place], [place] [address]    Special Requests  Final    NONE Performed at [facility], [address], [place], [place] [address]    Culture >=100,000 COLONIES/mL ENTEROBACTER CLOACAE (A)  Final   Report Status 04/25/2019 FINAL  Final   Organism ID, Bacteria ENTEROBACTER CLOACAE (A)  Final      Susceptibility   Enterobacter cloacae - MIC*    CEFAZOLIN >=64 RESISTANT Resistant     CIPROFLOXACIN <=0.25 SENSITIVE Sensitive     GENTAMICIN <=1 SENSITIVE Sensitive     IMIPENEM 1 SENSITIVE Sensitive     NITROFURANTOIN 32 SENSITIVE Sensitive     TRIMETH/SULFA <=20 SENSITIVE Sensitive     * >=100,000 COLONIES/mL ENTEROBACTER CLOACAE  Blood Culture (routine x 2)     Status: None   Collection Time: 04/22/19  4:42 PM   Specimen: BLOOD RIGHT HAND  Result Value Ref Range Status   Specimen Description BLOOD RIGHT  HAND  Final   Special Requests   Final    BOTTLES DRAWN AEROBIC AND ANAEROBIC Blood Culture adequate volume   Culture   Final    NO GROWTH 5 DAYS Performed at [facility], [address] [place], [place] [address]    Report Status 04/27/2019 FINAL  Final  Blood Culture (routine x 2)     Status: None   Collection Time: 04/22/19  4:42 PM   Specimen: BLOOD RIGHT FOREARM  Result Value Ref Range Status   Specimen Description BLOOD RIGHT FOREARM  Final   Special Requests   Final    BOTTLES DRAWN AEROBIC AND ANAEROBIC Blood Culture adequate volume   Culture   Final    NO GROWTH 5 DAYS Performed at [facility], [address], [place], [place] [address]    Report Status 04/27/2019 FINAL  Final  Respiratory Panel by RT PCR (Flu A&B, Covid) - Nasopharyngeal Swab     Status: None   Collection Time: 04/22/19  5:24 PM   Specimen: Nasopharyngeal Swab  Result Value Ref Range Status   SARS Coronavirus 2 by RT PCR NEGATIVE NEGATIVE Final    Comment: (NOTE) SARS-CoV-2 target nucleic acids are NOT DETECTED. The SARS-CoV-2 RNA is generally detectable in upper respiratoy specimens during the acute phase of infection. The lowest concentration of SARS-CoV-2 viral copies this assay can detect is 131 copies/mL. A negative result does not preclude SARS-Cov-2 infection and should not be used as the sole basis for treatment or other patient management decisions. A negative result may occur with  improper specimen collection/handling, submission of specimen other than nasopharyngeal swab, presence of viral mutation(s) within the areas targeted by this assay, and inadequate number of viral copies (<131 copies/mL). A negative result must be combined with clinical observations, patient history, and epidemiological information. The expected result is Negative. Fact Sheet for Patients:  https://www.moore.com/ Fact Sheet for Healthcare Providers:   https://www.young.biz/ This test is not yet ap proved or cleared by the [place] FDA and  has been authorized for detection and/or diagnosis of SARS-CoV-2 by FDA under an Emergency Use Authorization (EUA). This EUA will remain  in effect (meaning this test can be used) for the duration of the COVID-19 declaration under Section 564(b)(1) of the Act, 21 U.S.C. section 360bbb-3(b)(1), unless the authorization is terminated or revoked sooner.    Influenza A by PCR NEGATIVE NEGATIVE Final   Influenza B by PCR NEGATIVE NEGATIVE Final    Comment: (NOTE) The Xpert Xpress SARS-CoV-2/FLU/RSV assay is intended as an aid in  the diagnosis of influenza from Nasopharyngeal swab specimens and  should not be used as a sole basis for treatment. Nasal washings and  aspirates are unacceptable for Xpert Xpress SARS-CoV-2/FLU/RSV  testing. Fact Sheet for Patients: PinkCheek.be Fact Sheet for Healthcare Providers: GravelBags.it This test is not yet approved or cleared by the [place] FDA and  has been authorized for detection and/or diagnosis of SARS-CoV-2 by  FDA under an Emergency Use Authorization (EUA). This EUA will remain  in effect (meaning this test can be used) for the duration of the  Covid-19 declaration under Section 564(b)(1) of the Act, 21  U.S.C. section 360bbb-3(b)(1), unless the authorization is  terminated or revoked. Performed at [facility], [address], [place], [place] [address]   SARS CORONAVIRUS 2 (TAT 6-24 HRS) Nasopharyngeal Nasopharyngeal Swab     Status: None   Collection Time: 04/30/19  1:26 PM   Specimen: Nasopharyngeal Swab  Result Value Ref Range Status   SARS Coronavirus 2 NEGATIVE NEGATIVE Final    Comment: (NOTE) SARS-CoV-2 target nucleic acids are NOT DETECTED. The SARS-CoV-2 RNA is generally detectable in upper and lower respiratory specimens during the acute phase of  infection. Negative results do not preclude SARS-CoV-2 infection, do not rule out co-infections with other pathogens, and should not be used as the sole basis for treatment or other patient management decisions. Negative results must be combined with clinical observations, patient history, and epidemiological information. The expected result is Negative. Fact Sheet for Patients: SugarRoll.be Fact Sheet for Healthcare Providers: https://www.woods-mathews.com/ This test is not yet approved or cleared by the [place] FDA and  has been authorized for detection and/or diagnosis of SARS-CoV-2 by FDA under an Emergency Use Authorization (EUA). This EUA will remain  in effect (meaning this test can be used) for the duration of the COVID-19 declaration under Section 56 4(b)(1) of the Act, 21 U.S.C. section 360bbb-3(b)(1), unless the authorization is terminated or revoked sooner. Performed at [facility], [place] [address], [place], [place] [address]      Time coordinating discharge: Over 30 minutes  SIGNED:   Little Ishikawa, DO [facility] 05/02/2019, 11:02 AM Pager   If 7PM-7AM, please contact night-coverage [email or web address]

## 2019-05-06 LAB — CULTURE, BLOOD (ROUTINE X 2)
Culture: NO GROWTH
Culture: NO GROWTH

## 2019-05-10 DEATH — deceased

## 2020-03-04 IMAGING — DX DG CHEST 1V PORT
1 series · 1 of 1 positions shown · non-contrast
Comparison: 02/24/2019 chest radiograph.

CLINICAL DATA: Acute respiratory failure

EXAM:
PORTABLE CHEST 1 VIEW

[chest ap]
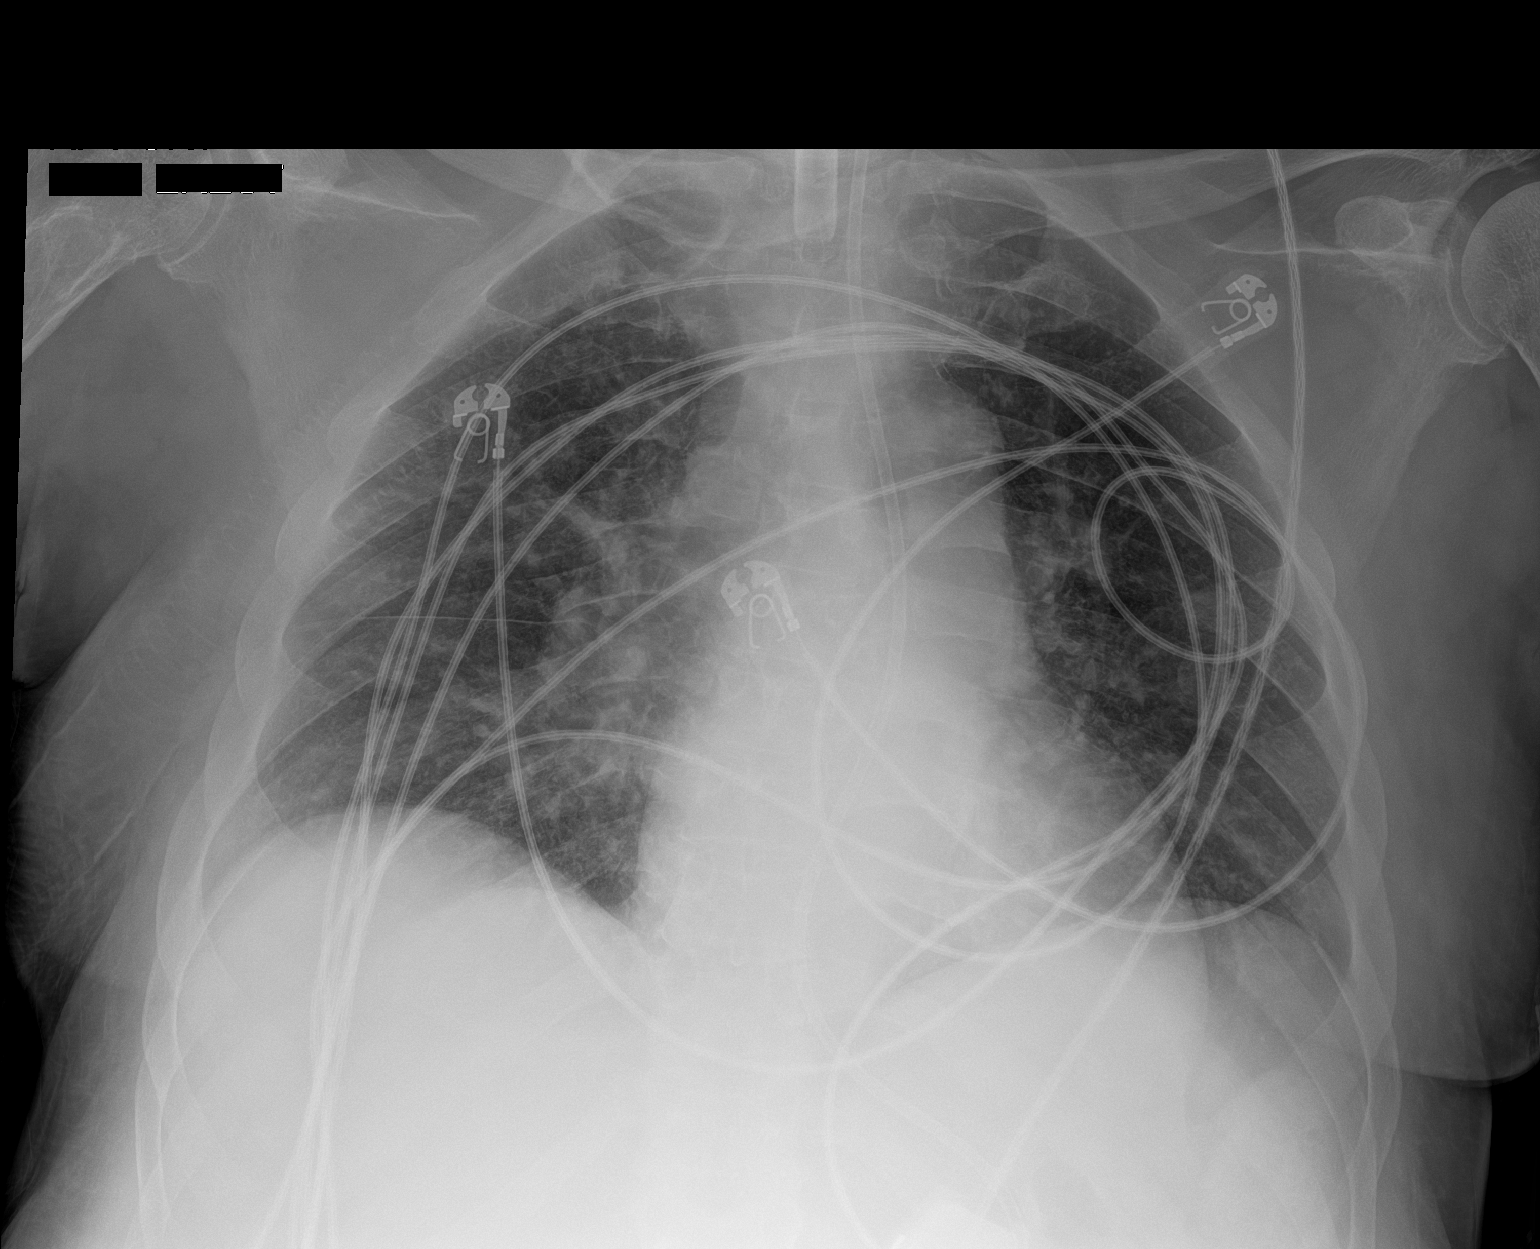

[1 of 1 positions shown; findings below may reference images not displayed]

FINDINGS: Tracheostomy tube tip overlies the tracheal air column at the level
of the thoracic inlet. Enteric tube enters stomach with the tip not
seen on this image. Stable cardiomediastinal silhouette with normal
heart size. No pneumothorax. No pleural effusion. Mild streaky right
perihilar opacities are unchanged. Clear left lung.
IMPRESSION: Stable chest radiograph with mild streaky right perihilar lung
opacities, which could represent atelectasis, aspiration or
pneumonia.

## 2020-03-07 IMAGING — DX DG CHEST 1V PORT
1 series · 1 of 1 positions shown · non-contrast
Comparison: March 03, 2019

CLINICAL DATA: Aspiration.

EXAM:
PORTABLE CHEST 1 VIEW

[chest ap]
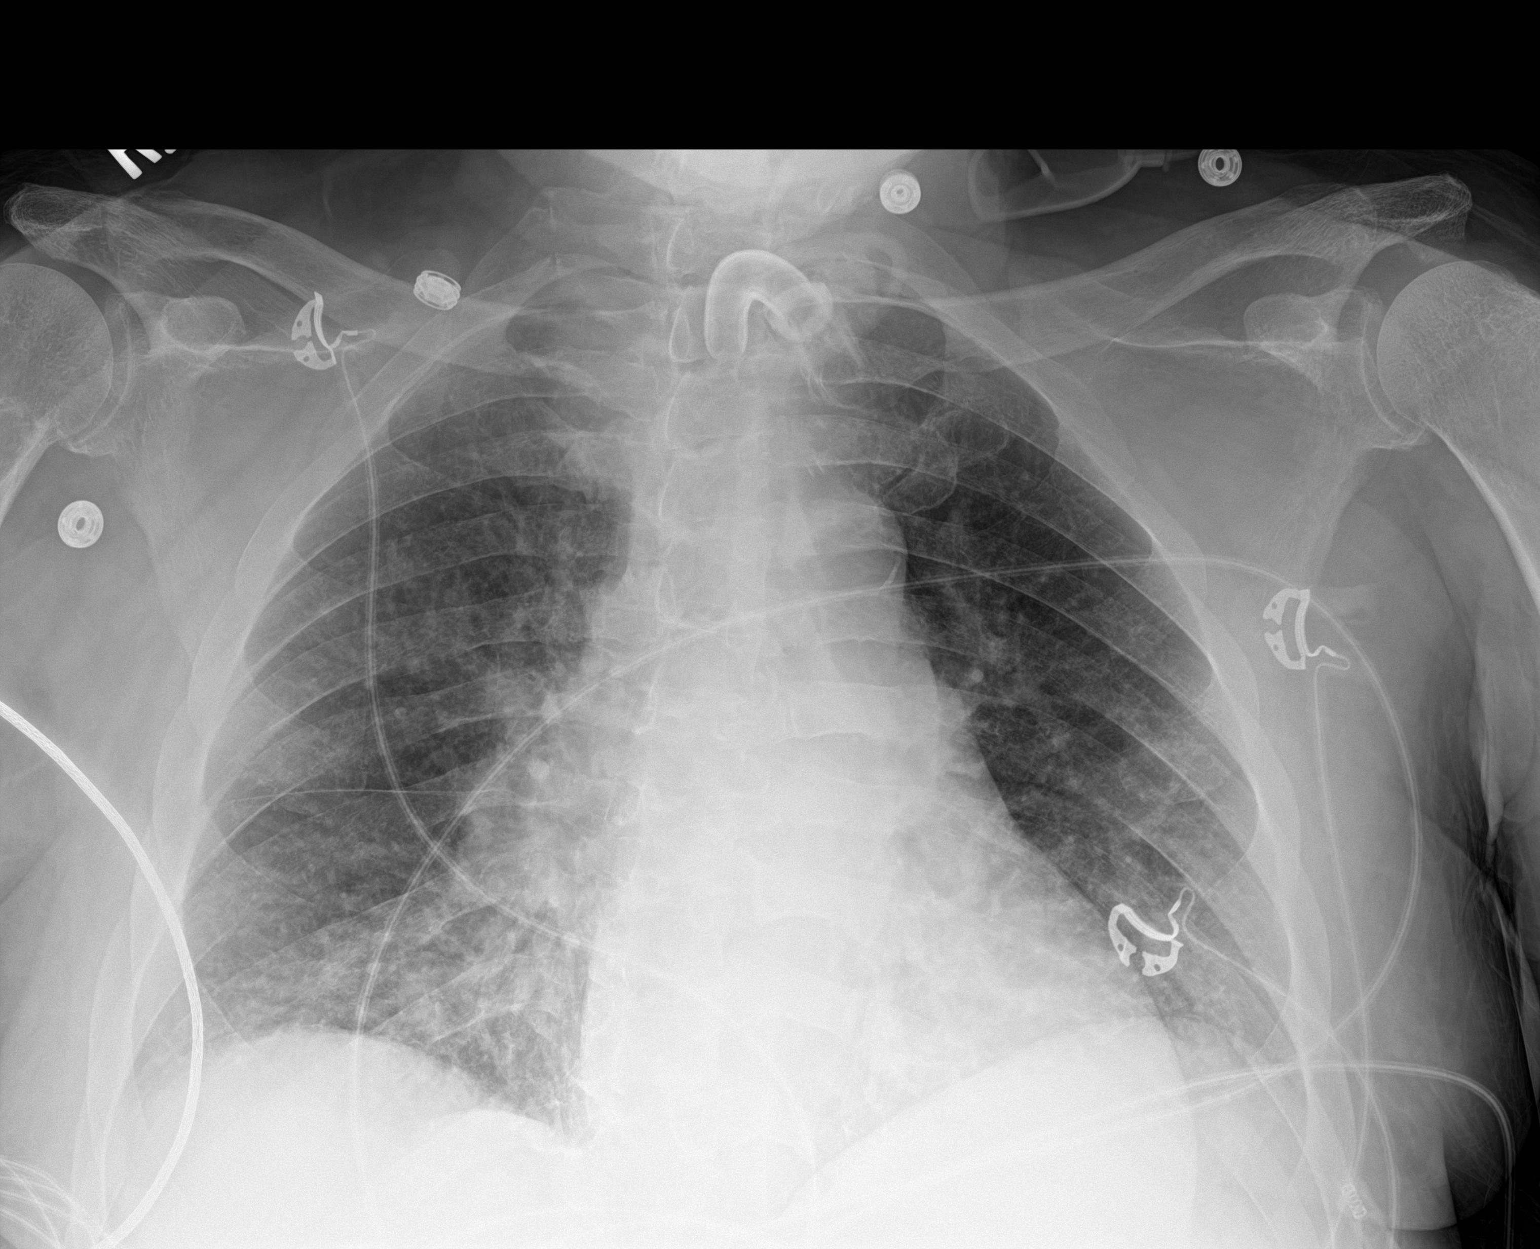

[1 of 1 positions shown; findings below may reference images not displayed]

FINDINGS: Stable cardiomediastinal silhouette. Tracheostomy tube is in good
position. Feeding tube has been removed. No pneumothorax or pleural
effusion is noted. Mild bibasilar atelectasis or infiltrates are
noted. Bony thorax is unremarkable.
IMPRESSION: Mild bibasilar subsegmental atelectasis or infiltrates are noted.
Tracheostomy tube in good position.

## 2020-04-14 IMAGING — MR MR HEAD W/O CM
7 of 8 series · 30 of 48 positions shown · non-contrast
Comparison: 01/22/2019

CLINICAL DATA: Encephalopathy, history of MSSA bacteremia

EXAM:
MRI HEAD WITHOUT CONTRAST
TECHNIQUE: Multiplanar, multiecho pulse sequences of the brain and surrounding
structures were obtained without intravenous contrast.

[Series 2: DWI · axial · 3.0mm · 0.94mm/px · z∈[-34,+113]mm · 8 of 100 slices shown (1 of 2)]
[im 1/100]
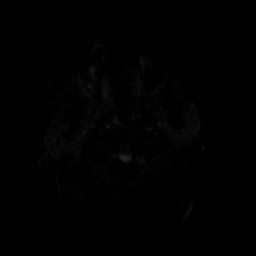
[im 16/100]
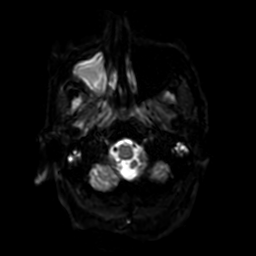
[im 31/100]
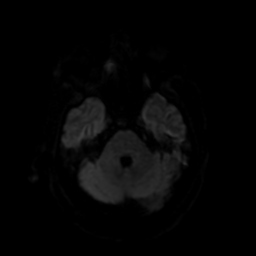
[im 46/100]
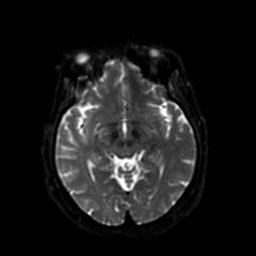
[im 54/100]
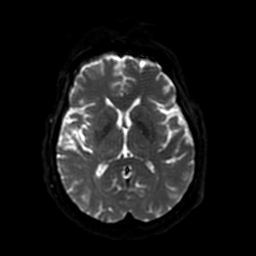
[im 69/100]
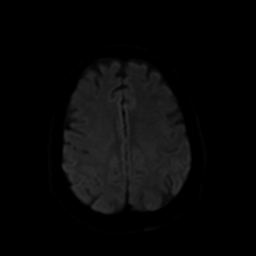
[im 84/100]
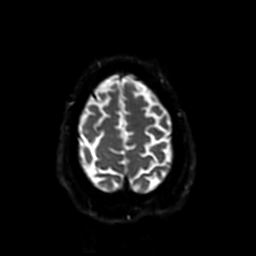
[im 100/100]
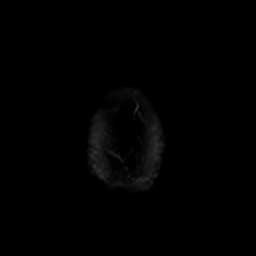

[Series 3: DWI · coronal · 4.0mm · 0.94mm/px · 8 of 74 slices shown (2 of 2)]
[im 1/74]
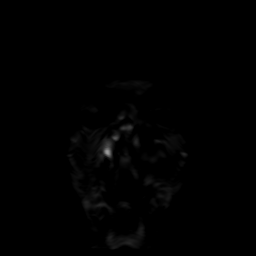
[im 9/74]
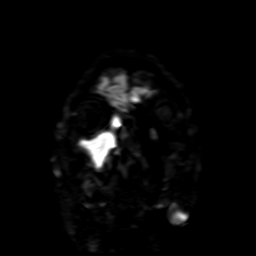
[im 25/74]
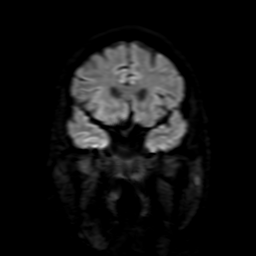
[im 33/74]
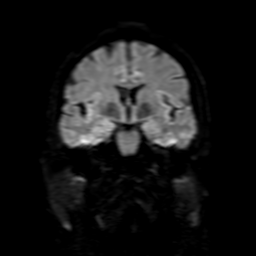
[im 41/74]
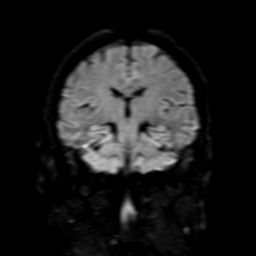
[im 49/74]
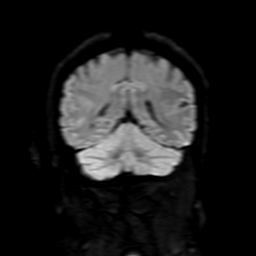
[im 65/74]
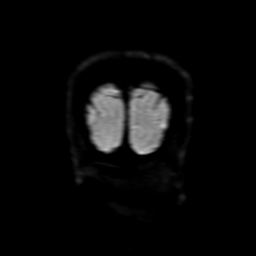
[im 74/74]
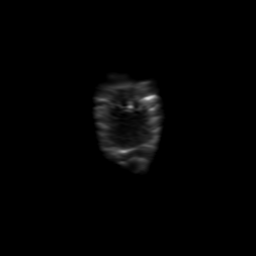

[Series 6: FLAIR · sagittal · 5.0mm · 0.47mm/px · 3 of 25 slices shown (1 of 2)]
[im 1/25]
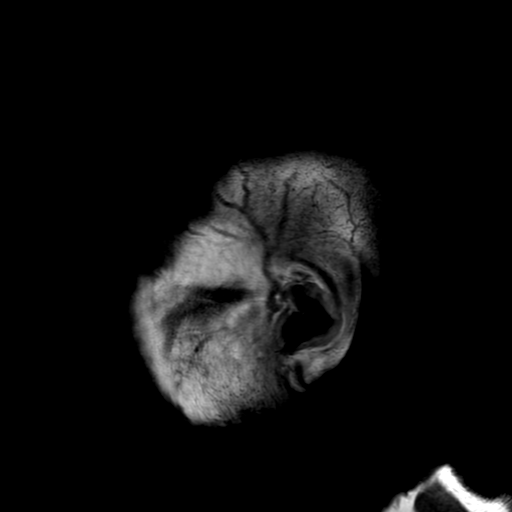
[im 13/25]
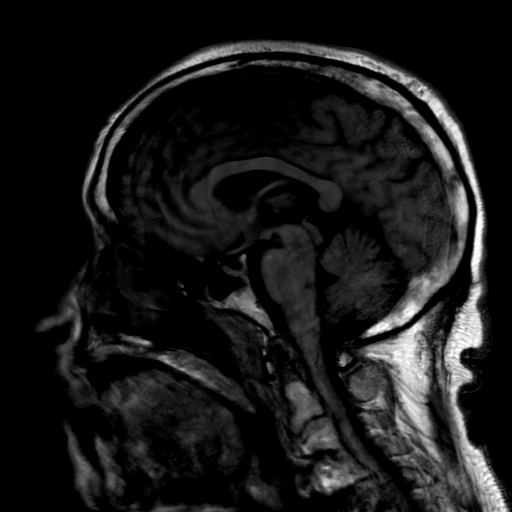
[im 25/25]
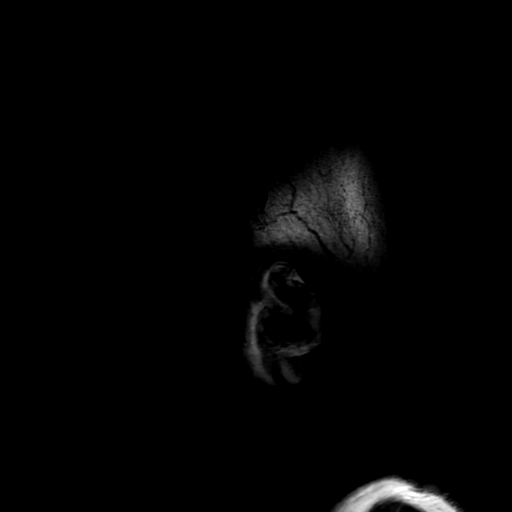

[Series 8: FLAIR · axial · 3.0mm · 0.47mm/px · z∈[-31,+113]mm · 3 of 25 slices shown (2 of 2)]
[im 1/25]
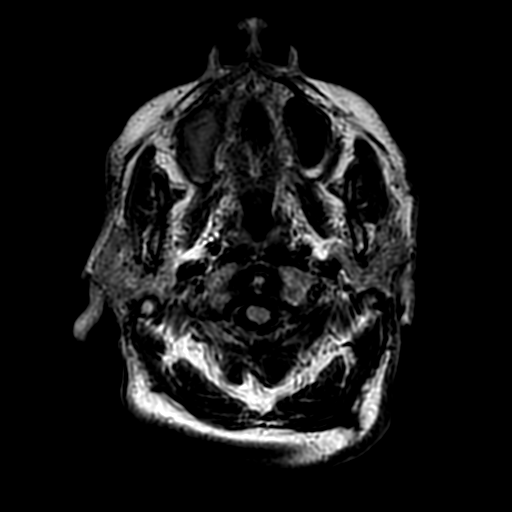
[im 13/25]
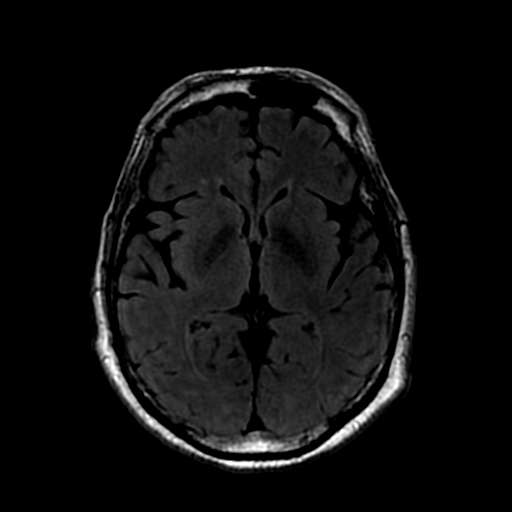
[im 25/25]
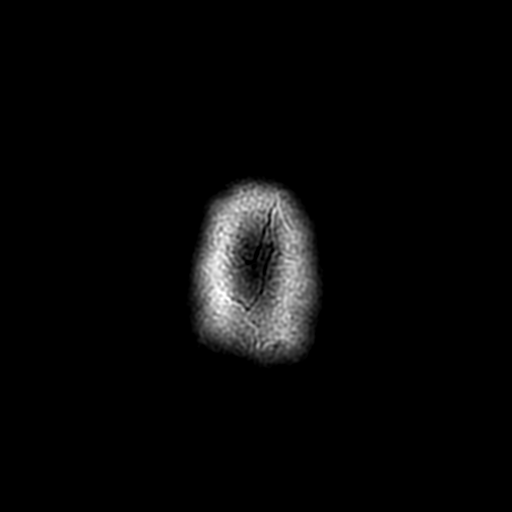

[Series 9: GRE · axial · 5.0mm · 0.47mm/px · 1 of 27 slices shown]
[im 1/27]
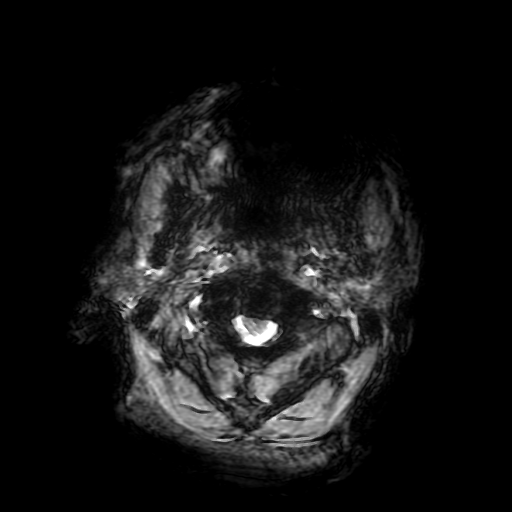

[Series 10: T2 · axial · 5.0mm · 0.47mm/px · z∈[-31,+113]mm · 3 of 25 slices shown (1 of 2)]
[im 1/25]
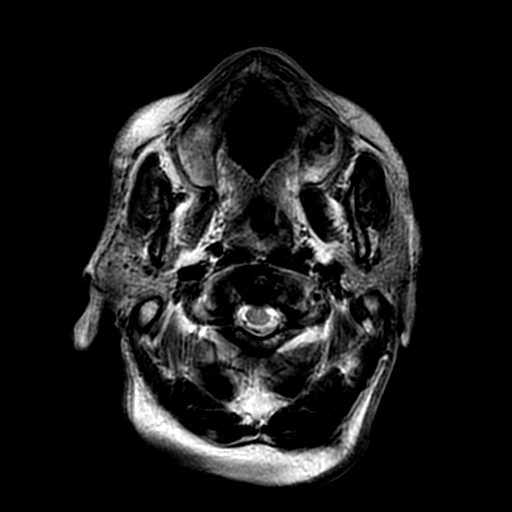
[im 13/25]
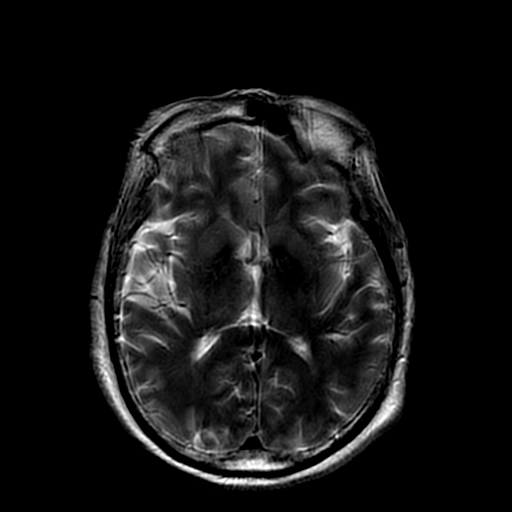
[im 25/25]
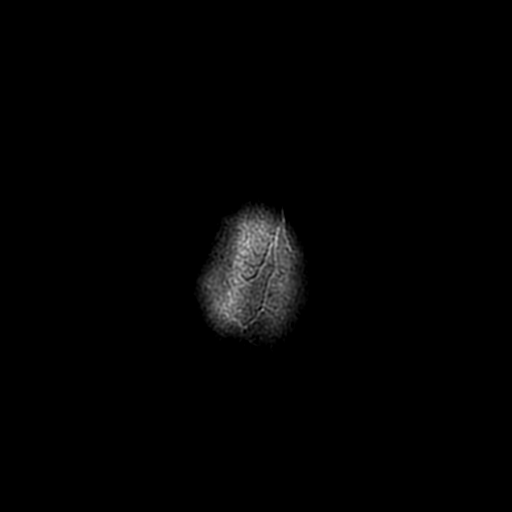

[Series 12: T2 · coronal · 5.0mm · 0.47mm/px · 4 of 31 slices shown (2 of 2)]
[im 1/31]
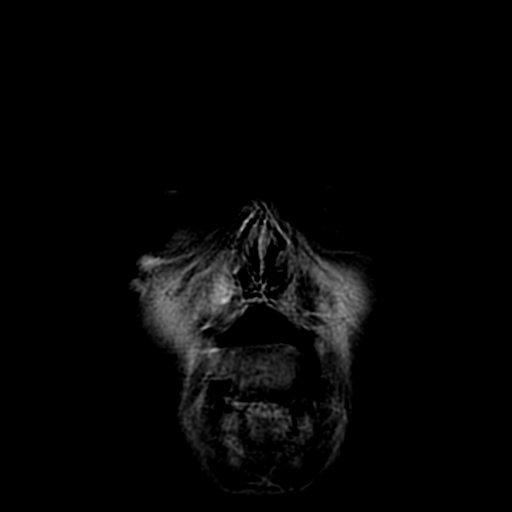
[im 11/31]
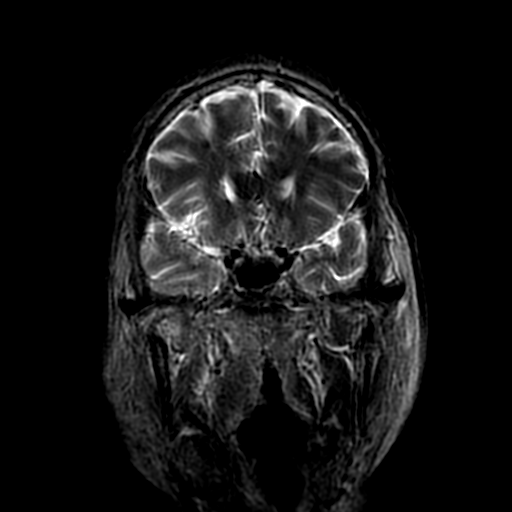
[im 21/31]
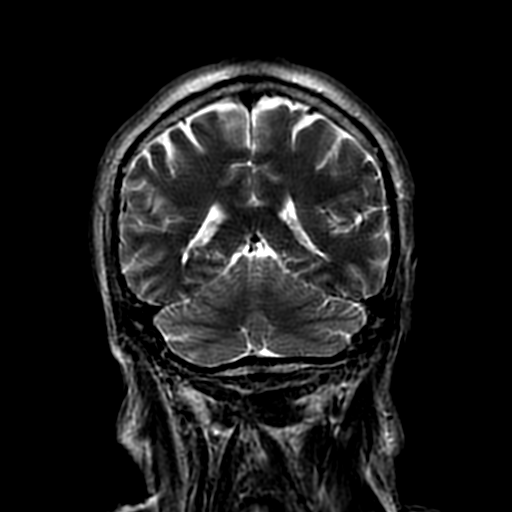
[im 31/31]
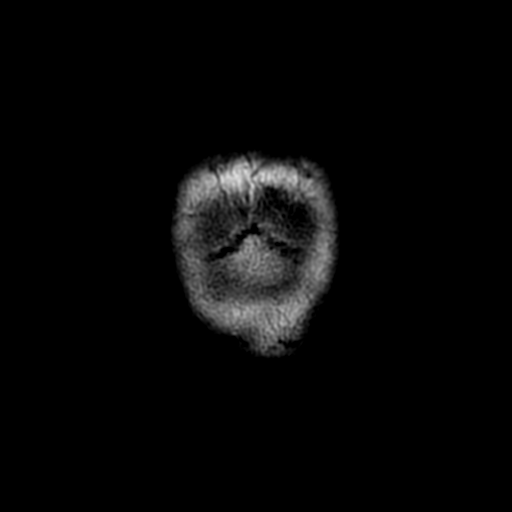

[30 of 48 positions shown; findings below may reference images not displayed]

FINDINGS: Motion artifact is present.

Brain: There is no acute infarction or intracranial hemorrhage.
Abnormal signal at the level of the left facial colliculus has
decreased with presumed residual encephalomalacia. There is no
intracranial mass, mass effect, or edema. There is no hydrocephalus
or extra-axial fluid collection. Ventricles and sulci are remain
normal in size and configuration.

Vascular: Major vessel flow voids at the skull base are preserved.

Skull and upper cervical spine: Normal marrow signal. There is
susceptibility artifact related to partially imaged cervical
anterior fusion.

Sinuses/Orbits: Nonspecific persistent paranasal sinus inflammatory
changes and bilateral mastoid effusions. Orbits are unremarkable.

Other: Sella is unremarkable.  Mastoid air cells are clear.
IMPRESSION: Suboptimal evaluation due to motion artifact. Abnormal signal of the
dorsal brainstem has significantly decreased with presumed residual
encephalomalacia. No new findings.
